# Patient Record
Sex: Female | Born: 1935 | Race: Black or African American | Hispanic: No | Marital: Single | Smoking: Former smoker
Health system: Southern US, Community
[De-identification: ages and names within clinical notes are randomized; demographics above are authoritative.]

## PROBLEM LIST (undated history)

## (undated) DIAGNOSIS — G459 Transient cerebral ischemic attack, unspecified: Secondary | ICD-10-CM

## (undated) DIAGNOSIS — F32A Depression, unspecified: Secondary | ICD-10-CM

## (undated) DIAGNOSIS — T191XXA Foreign body in bladder, initial encounter: Secondary | ICD-10-CM

## (undated) DIAGNOSIS — I509 Heart failure, unspecified: Secondary | ICD-10-CM

## (undated) DIAGNOSIS — A4902 Methicillin resistant Staphylococcus aureus infection, unspecified site: Secondary | ICD-10-CM

## (undated) DIAGNOSIS — I672 Cerebral atherosclerosis: Secondary | ICD-10-CM

## (undated) DIAGNOSIS — E785 Hyperlipidemia, unspecified: Secondary | ICD-10-CM

## (undated) DIAGNOSIS — K219 Gastro-esophageal reflux disease without esophagitis: Secondary | ICD-10-CM

## (undated) DIAGNOSIS — R609 Edema, unspecified: Secondary | ICD-10-CM

## (undated) DIAGNOSIS — D649 Anemia, unspecified: Secondary | ICD-10-CM

## (undated) DIAGNOSIS — T190XXA Foreign body in urethra, initial encounter: Secondary | ICD-10-CM

## (undated) DIAGNOSIS — G8190 Hemiplegia, unspecified affecting unspecified side: Secondary | ICD-10-CM

## (undated) DIAGNOSIS — G4700 Insomnia, unspecified: Secondary | ICD-10-CM

## (undated) DIAGNOSIS — I739 Peripheral vascular disease, unspecified: Secondary | ICD-10-CM

## (undated) DIAGNOSIS — I6522 Occlusion and stenosis of left carotid artery: Secondary | ICD-10-CM

## (undated) DIAGNOSIS — I639 Cerebral infarction, unspecified: Secondary | ICD-10-CM

## (undated) DIAGNOSIS — Z431 Encounter for attention to gastrostomy: Secondary | ICD-10-CM

## (undated) DIAGNOSIS — F329 Major depressive disorder, single episode, unspecified: Secondary | ICD-10-CM

## (undated) DIAGNOSIS — G2581 Restless legs syndrome: Secondary | ICD-10-CM

## (undated) HISTORY — DX: Peripheral vascular disease, unspecified: I73.9

## (undated) HISTORY — DX: Encounter for attention to gastrostomy: Z43.1

## (undated) HISTORY — PX: PEG PLACEMENT: SHX5437

## (undated) HISTORY — DX: Occlusion and stenosis of left carotid artery: I65.22

## (undated) HISTORY — DX: Morbid (severe) obesity due to excess calories: E66.01

## (undated) HISTORY — PX: UPPER GASTROINTESTINAL ENDOSCOPY: SHX188

## (undated) HISTORY — DX: Restless legs syndrome: G25.81

## (undated) HISTORY — DX: Cerebral infarction, unspecified: I63.9

## (undated) HISTORY — DX: Gastro-esophageal reflux disease without esophagitis: K21.9

## (undated) HISTORY — DX: Hyperlipidemia, unspecified: E78.5

---

## 2001-11-29 ENCOUNTER — Encounter: Payer: Self-pay | Admitting: Family Medicine

## 2001-11-29 ENCOUNTER — Ambulatory Visit (HOSPITAL_COMMUNITY): Admission: RE | Admit: 2001-11-29 | Discharge: 2001-11-29 | Payer: Self-pay | Admitting: Family Medicine

## 2001-12-25 ENCOUNTER — Ambulatory Visit (HOSPITAL_COMMUNITY): Admission: RE | Admit: 2001-12-25 | Discharge: 2001-12-25 | Payer: Self-pay | Admitting: Family Medicine

## 2002-03-15 ENCOUNTER — Encounter (HOSPITAL_BASED_OUTPATIENT_CLINIC_OR_DEPARTMENT_OTHER): Admission: RE | Admit: 2002-03-15 | Discharge: 2002-04-20 | Payer: Self-pay | Admitting: Internal Medicine

## 2002-10-01 ENCOUNTER — Emergency Department (HOSPITAL_COMMUNITY): Admission: EM | Admit: 2002-10-01 | Discharge: 2002-10-01 | Payer: Self-pay | Admitting: Emergency Medicine

## 2002-10-01 ENCOUNTER — Encounter: Payer: Self-pay | Admitting: Emergency Medicine

## 2002-11-05 ENCOUNTER — Encounter: Admission: RE | Admit: 2002-11-05 | Discharge: 2003-02-03 | Payer: Self-pay | Admitting: Internal Medicine

## 2003-02-22 ENCOUNTER — Encounter (HOSPITAL_BASED_OUTPATIENT_CLINIC_OR_DEPARTMENT_OTHER): Admission: RE | Admit: 2003-02-22 | Discharge: 2003-05-23 | Payer: Self-pay | Admitting: Internal Medicine

## 2003-05-09 ENCOUNTER — Ambulatory Visit (HOSPITAL_COMMUNITY): Admission: RE | Admit: 2003-05-09 | Discharge: 2003-05-09 | Payer: Self-pay | Admitting: Family Medicine

## 2003-05-09 ENCOUNTER — Encounter: Payer: Self-pay | Admitting: Family Medicine

## 2003-05-12 ENCOUNTER — Inpatient Hospital Stay (HOSPITAL_COMMUNITY): Admission: EM | Admit: 2003-05-12 | Discharge: 2003-05-21 | Payer: Self-pay | Admitting: Emergency Medicine

## 2003-05-12 ENCOUNTER — Encounter: Payer: Self-pay | Admitting: General Surgery

## 2003-05-14 ENCOUNTER — Encounter: Payer: Self-pay | Admitting: Family Medicine

## 2003-05-15 ENCOUNTER — Ambulatory Visit (HOSPITAL_COMMUNITY): Admission: RE | Admit: 2003-05-15 | Discharge: 2003-05-15 | Payer: Self-pay | Admitting: Family Medicine

## 2003-05-15 ENCOUNTER — Encounter: Payer: Self-pay | Admitting: Family Medicine

## 2003-05-24 ENCOUNTER — Encounter (HOSPITAL_BASED_OUTPATIENT_CLINIC_OR_DEPARTMENT_OTHER): Admission: RE | Admit: 2003-05-24 | Discharge: 2003-08-07 | Payer: Self-pay | Admitting: Internal Medicine

## 2003-08-31 HISTORY — PX: VENTRAL HERNIA REPAIR: SHX424

## 2003-10-24 ENCOUNTER — Encounter (HOSPITAL_BASED_OUTPATIENT_CLINIC_OR_DEPARTMENT_OTHER): Admission: RE | Admit: 2003-10-24 | Discharge: 2003-11-04 | Payer: Self-pay | Admitting: Internal Medicine

## 2003-12-27 ENCOUNTER — Emergency Department (HOSPITAL_COMMUNITY): Admission: EM | Admit: 2003-12-27 | Discharge: 2003-12-27 | Payer: Self-pay | Admitting: Emergency Medicine

## 2004-01-21 ENCOUNTER — Ambulatory Visit (HOSPITAL_COMMUNITY): Admission: RE | Admit: 2004-01-21 | Discharge: 2004-01-21 | Payer: Self-pay | Admitting: General Surgery

## 2004-01-28 ENCOUNTER — Encounter (HOSPITAL_COMMUNITY): Admission: RE | Admit: 2004-01-28 | Discharge: 2004-01-29 | Payer: Self-pay | Admitting: Internal Medicine

## 2004-02-07 ENCOUNTER — Inpatient Hospital Stay (HOSPITAL_COMMUNITY): Admission: RE | Admit: 2004-02-07 | Discharge: 2004-02-11 | Payer: Self-pay | Admitting: General Surgery

## 2004-03-12 ENCOUNTER — Encounter (HOSPITAL_BASED_OUTPATIENT_CLINIC_OR_DEPARTMENT_OTHER): Admission: RE | Admit: 2004-03-12 | Discharge: 2004-03-20 | Payer: Self-pay | Admitting: Internal Medicine

## 2004-09-30 ENCOUNTER — Ambulatory Visit (HOSPITAL_COMMUNITY): Admission: RE | Admit: 2004-09-30 | Discharge: 2004-09-30 | Payer: Self-pay | Admitting: Family Medicine

## 2004-09-30 ENCOUNTER — Ambulatory Visit: Payer: Self-pay | Admitting: Family Medicine

## 2004-10-21 ENCOUNTER — Ambulatory Visit: Payer: Self-pay | Admitting: Family Medicine

## 2004-12-02 ENCOUNTER — Ambulatory Visit: Payer: Self-pay | Admitting: Family Medicine

## 2005-03-03 ENCOUNTER — Ambulatory Visit: Payer: Self-pay | Admitting: Family Medicine

## 2005-04-21 ENCOUNTER — Ambulatory Visit: Payer: Self-pay | Admitting: Family Medicine

## 2005-07-21 ENCOUNTER — Ambulatory Visit: Payer: Self-pay | Admitting: Family Medicine

## 2005-09-01 ENCOUNTER — Ambulatory Visit: Payer: Self-pay | Admitting: Family Medicine

## 2005-11-30 ENCOUNTER — Ambulatory Visit: Payer: Self-pay | Admitting: Family Medicine

## 2006-03-21 ENCOUNTER — Ambulatory Visit: Payer: Self-pay | Admitting: Family Medicine

## 2006-05-23 ENCOUNTER — Emergency Department (HOSPITAL_COMMUNITY): Admission: EM | Admit: 2006-05-23 | Discharge: 2006-05-23 | Payer: Self-pay | Admitting: Emergency Medicine

## 2006-05-26 ENCOUNTER — Ambulatory Visit: Payer: Self-pay | Admitting: Family Medicine

## 2006-06-29 ENCOUNTER — Ambulatory Visit: Payer: Self-pay | Admitting: Family Medicine

## 2006-07-14 ENCOUNTER — Emergency Department (HOSPITAL_COMMUNITY): Admission: EM | Admit: 2006-07-14 | Discharge: 2006-07-15 | Payer: Self-pay | Admitting: Emergency Medicine

## 2006-07-19 ENCOUNTER — Ambulatory Visit: Payer: Self-pay | Admitting: Family Medicine

## 2006-07-22 ENCOUNTER — Inpatient Hospital Stay (HOSPITAL_COMMUNITY): Admission: EM | Admit: 2006-07-22 | Discharge: 2006-07-30 | Payer: Self-pay | Admitting: Emergency Medicine

## 2006-07-23 ENCOUNTER — Ambulatory Visit: Payer: Self-pay | Admitting: Cardiology

## 2006-07-23 ENCOUNTER — Ambulatory Visit: Payer: Self-pay | Admitting: Critical Care Medicine

## 2006-07-24 ENCOUNTER — Encounter: Payer: Self-pay | Admitting: Critical Care Medicine

## 2006-07-25 ENCOUNTER — Encounter: Payer: Self-pay | Admitting: Cardiology

## 2006-08-05 ENCOUNTER — Ambulatory Visit: Payer: Self-pay | Admitting: Family Medicine

## 2006-08-05 ENCOUNTER — Inpatient Hospital Stay (HOSPITAL_COMMUNITY): Admission: EM | Admit: 2006-08-05 | Discharge: 2006-08-22 | Payer: Self-pay | Admitting: Emergency Medicine

## 2006-08-08 ENCOUNTER — Encounter (INDEPENDENT_AMBULATORY_CARE_PROVIDER_SITE_OTHER): Payer: Self-pay | Admitting: Interventional Cardiology

## 2006-08-08 ENCOUNTER — Ambulatory Visit: Payer: Self-pay | Admitting: Pulmonary Disease

## 2006-08-09 ENCOUNTER — Encounter: Payer: Self-pay | Admitting: Pulmonary Disease

## 2006-08-13 ENCOUNTER — Ambulatory Visit: Payer: Self-pay | Admitting: Infectious Diseases

## 2006-08-16 ENCOUNTER — Encounter: Payer: Self-pay | Admitting: Vascular Surgery

## 2006-08-29 ENCOUNTER — Inpatient Hospital Stay (HOSPITAL_COMMUNITY): Admission: EM | Admit: 2006-08-29 | Discharge: 2006-09-07 | Payer: Self-pay | Admitting: Emergency Medicine

## 2006-08-30 HISTORY — PX: COLONOSCOPY: SHX174

## 2006-09-02 ENCOUNTER — Encounter: Payer: Self-pay | Admitting: *Deleted

## 2006-09-07 ENCOUNTER — Ambulatory Visit: Payer: Self-pay | Admitting: Gastroenterology

## 2006-09-13 ENCOUNTER — Ambulatory Visit: Payer: Self-pay | Admitting: Family Medicine

## 2006-09-16 ENCOUNTER — Encounter: Payer: Self-pay | Admitting: Family Medicine

## 2006-09-16 LAB — CONVERTED CEMR LAB: Retic Count, Absolute: 61.6 (ref 19.0–186.0)

## 2006-09-20 ENCOUNTER — Encounter (INDEPENDENT_AMBULATORY_CARE_PROVIDER_SITE_OTHER): Payer: Self-pay | Admitting: *Deleted

## 2006-09-20 ENCOUNTER — Ambulatory Visit: Payer: Self-pay | Admitting: Gastroenterology

## 2006-09-29 ENCOUNTER — Ambulatory Visit (HOSPITAL_COMMUNITY): Admission: RE | Admit: 2006-09-29 | Discharge: 2006-09-29 | Payer: Self-pay | Admitting: Family Medicine

## 2006-10-12 ENCOUNTER — Ambulatory Visit: Payer: Self-pay | Admitting: Family Medicine

## 2006-10-15 ENCOUNTER — Observation Stay (HOSPITAL_COMMUNITY): Admission: AD | Admit: 2006-10-15 | Discharge: 2006-10-15 | Payer: Self-pay | Admitting: Obstetrics and Gynecology

## 2006-10-15 ENCOUNTER — Encounter (INDEPENDENT_AMBULATORY_CARE_PROVIDER_SITE_OTHER): Payer: Self-pay | Admitting: *Deleted

## 2006-10-17 ENCOUNTER — Ambulatory Visit (HOSPITAL_COMMUNITY): Admission: RE | Admit: 2006-10-17 | Discharge: 2006-10-17 | Payer: Self-pay | Admitting: Family Medicine

## 2006-10-25 ENCOUNTER — Ambulatory Visit: Payer: Self-pay | Admitting: Gastroenterology

## 2006-11-11 ENCOUNTER — Inpatient Hospital Stay (HOSPITAL_COMMUNITY): Admission: EM | Admit: 2006-11-11 | Discharge: 2006-11-18 | Payer: Self-pay | Admitting: Emergency Medicine

## 2006-11-11 ENCOUNTER — Ambulatory Visit: Payer: Self-pay | Admitting: Gastroenterology

## 2006-11-11 ENCOUNTER — Ambulatory Visit: Payer: Self-pay | Admitting: Family Medicine

## 2006-11-14 ENCOUNTER — Ambulatory Visit: Payer: Self-pay | Admitting: Family Medicine

## 2006-11-16 ENCOUNTER — Encounter (INDEPENDENT_AMBULATORY_CARE_PROVIDER_SITE_OTHER): Payer: Self-pay | Admitting: Specialist

## 2006-11-16 ENCOUNTER — Ambulatory Visit: Payer: Self-pay | Admitting: Internal Medicine

## 2006-11-25 ENCOUNTER — Ambulatory Visit: Payer: Self-pay | Admitting: Gastroenterology

## 2006-12-15 ENCOUNTER — Ambulatory Visit (HOSPITAL_COMMUNITY): Admission: RE | Admit: 2006-12-15 | Discharge: 2006-12-15 | Payer: Self-pay | Admitting: Neurology

## 2007-01-17 ENCOUNTER — Ambulatory Visit: Payer: Self-pay | Admitting: Family Medicine

## 2007-01-20 ENCOUNTER — Encounter: Payer: Self-pay | Admitting: Family Medicine

## 2007-01-20 LAB — CONVERTED CEMR LAB
Nitrite: POSITIVE — AB
Protein, ur: NEGATIVE mg/dL
pH: 6 (ref 5.0–8.0)

## 2007-02-06 ENCOUNTER — Ambulatory Visit: Payer: Self-pay | Admitting: Family Medicine

## 2007-02-06 LAB — CONVERTED CEMR LAB
Basophils Absolute: 0.1 10*3/uL (ref 0.0–0.1)
Basophils Relative: 1 % (ref 0–1)
Bilirubin, Direct: 0.1 mg/dL (ref 0.0–0.3)
Calcium: 8.4 mg/dL (ref 8.4–10.5)
Creatinine, Ser: 1.01 mg/dL (ref 0.40–1.20)
Eosinophils Absolute: 0.5 10*3/uL (ref 0.0–0.7)
Eosinophils Relative: 6 % — ABNORMAL HIGH (ref 0–5)
HCT: 29.4 % — ABNORMAL LOW (ref 36.0–46.0)
Hemoglobin: 9.9 g/dL — ABNORMAL LOW (ref 12.0–15.0)
Indirect Bilirubin: 0.2 mg/dL (ref 0.0–0.9)
Iron: 40 ug/dL — ABNORMAL LOW (ref 42–145)
MCHC: 33.9 g/dL (ref 30.0–36.0)
Monocytes Absolute: 0.5 10*3/uL (ref 0.2–0.7)
RDW: 15.2 % — ABNORMAL HIGH (ref 11.5–14.0)
Retic Count, Absolute: 77.1 (ref 19.0–186.0)
Saturation Ratios: 24 % (ref 20–55)
TIBC: 166 ug/dL — ABNORMAL LOW (ref 250–470)
Total Bilirubin: 0.3 mg/dL (ref 0.3–1.2)
Total Protein: 6.7 g/dL (ref 6.0–8.3)

## 2007-02-07 ENCOUNTER — Encounter: Payer: Self-pay | Admitting: Family Medicine

## 2007-02-07 LAB — CONVERTED CEMR LAB

## 2007-02-08 ENCOUNTER — Emergency Department (HOSPITAL_COMMUNITY): Admission: EM | Admit: 2007-02-08 | Discharge: 2007-02-08 | Payer: Self-pay | Admitting: Emergency Medicine

## 2007-02-08 ENCOUNTER — Encounter (HOSPITAL_COMMUNITY): Admission: RE | Admit: 2007-02-08 | Discharge: 2007-03-10 | Payer: Self-pay | Admitting: Family Medicine

## 2007-02-09 ENCOUNTER — Encounter (HOSPITAL_BASED_OUTPATIENT_CLINIC_OR_DEPARTMENT_OTHER): Admission: RE | Admit: 2007-02-09 | Discharge: 2007-05-10 | Payer: Self-pay | Admitting: Surgery

## 2007-02-20 ENCOUNTER — Emergency Department (HOSPITAL_COMMUNITY): Admission: EM | Admit: 2007-02-20 | Discharge: 2007-02-20 | Payer: Self-pay | Admitting: Emergency Medicine

## 2007-02-24 ENCOUNTER — Encounter: Payer: Self-pay | Admitting: Family Medicine

## 2007-03-06 ENCOUNTER — Ambulatory Visit: Payer: Self-pay | Admitting: Family Medicine

## 2007-03-24 ENCOUNTER — Ambulatory Visit: Payer: Self-pay | Admitting: Family Medicine

## 2007-05-12 ENCOUNTER — Ambulatory Visit: Payer: Self-pay | Admitting: Family Medicine

## 2007-07-13 ENCOUNTER — Ambulatory Visit: Payer: Self-pay | Admitting: Family Medicine

## 2007-08-09 ENCOUNTER — Ambulatory Visit: Payer: Self-pay | Admitting: Family Medicine

## 2007-08-21 ENCOUNTER — Encounter: Payer: Self-pay | Admitting: Family Medicine

## 2007-08-22 ENCOUNTER — Encounter: Payer: Self-pay | Admitting: Family Medicine

## 2007-08-22 LAB — CONVERTED CEMR LAB: Microalb, Ur: 3.9 mg/dL — ABNORMAL HIGH (ref 0.00–1.89)

## 2007-08-31 ENCOUNTER — Encounter: Payer: Self-pay | Admitting: Family Medicine

## 2007-09-12 ENCOUNTER — Ambulatory Visit: Payer: Self-pay | Admitting: Family Medicine

## 2007-09-13 ENCOUNTER — Ambulatory Visit: Payer: Self-pay | Admitting: Family Medicine

## 2007-09-13 LAB — CONVERTED CEMR LAB
Eosinophils Relative: 5 % (ref 0–5)
FSH: 13.8 milliintl units/mL
HCT: 38.1 % (ref 36.0–46.0)
LH: 13.4 milliintl units/mL
Lymphocytes Relative: 44 % (ref 12–46)
Lymphs Abs: 3.1 10*3/uL (ref 0.7–4.0)
Platelets: 338 10*3/uL (ref 150–400)
TSH: 1.718 microintl units/mL (ref 0.350–5.50)
WBC: 7.1 10*3/uL (ref 4.0–10.5)

## 2007-11-14 ENCOUNTER — Ambulatory Visit: Payer: Self-pay | Admitting: Family Medicine

## 2007-11-24 DIAGNOSIS — L02419 Cutaneous abscess of limb, unspecified: Secondary | ICD-10-CM

## 2007-11-24 DIAGNOSIS — G4700 Insomnia, unspecified: Secondary | ICD-10-CM

## 2007-11-24 DIAGNOSIS — L03119 Cellulitis of unspecified part of limb: Secondary | ICD-10-CM | POA: Insufficient documentation

## 2007-11-24 DIAGNOSIS — G2581 Restless legs syndrome: Secondary | ICD-10-CM

## 2007-11-24 DIAGNOSIS — M1710 Unilateral primary osteoarthritis, unspecified knee: Secondary | ICD-10-CM

## 2007-11-24 DIAGNOSIS — K219 Gastro-esophageal reflux disease without esophagitis: Secondary | ICD-10-CM

## 2007-11-24 DIAGNOSIS — E119 Type 2 diabetes mellitus without complications: Secondary | ICD-10-CM

## 2007-11-24 DIAGNOSIS — E785 Hyperlipidemia, unspecified: Secondary | ICD-10-CM

## 2007-11-24 DIAGNOSIS — I739 Peripheral vascular disease, unspecified: Secondary | ICD-10-CM | POA: Insufficient documentation

## 2007-11-29 ENCOUNTER — Ambulatory Visit: Payer: Self-pay | Admitting: Family Medicine

## 2007-12-08 ENCOUNTER — Encounter (HOSPITAL_COMMUNITY): Admission: RE | Admit: 2007-12-08 | Discharge: 2008-01-07 | Payer: Self-pay | Admitting: Family Medicine

## 2008-01-03 ENCOUNTER — Ambulatory Visit: Payer: Self-pay | Admitting: Family Medicine

## 2008-02-05 ENCOUNTER — Ambulatory Visit: Payer: Self-pay | Admitting: Family Medicine

## 2008-03-06 ENCOUNTER — Ambulatory Visit: Payer: Self-pay | Admitting: Family Medicine

## 2008-03-17 ENCOUNTER — Inpatient Hospital Stay (HOSPITAL_COMMUNITY): Admission: EM | Admit: 2008-03-17 | Discharge: 2008-03-22 | Payer: Self-pay | Admitting: Emergency Medicine

## 2008-03-28 ENCOUNTER — Telehealth: Payer: Self-pay | Admitting: Family Medicine

## 2008-03-29 ENCOUNTER — Encounter: Payer: Self-pay | Admitting: Family Medicine

## 2008-03-29 ENCOUNTER — Ambulatory Visit: Payer: Self-pay | Admitting: Family Medicine

## 2008-03-29 ENCOUNTER — Telehealth: Payer: Self-pay | Admitting: Family Medicine

## 2008-04-03 ENCOUNTER — Ambulatory Visit: Payer: Self-pay | Admitting: Family Medicine

## 2008-05-03 ENCOUNTER — Telehealth: Payer: Self-pay | Admitting: Family Medicine

## 2008-05-10 ENCOUNTER — Encounter: Payer: Self-pay | Admitting: Family Medicine

## 2008-05-14 ENCOUNTER — Ambulatory Visit: Payer: Self-pay | Admitting: Family Medicine

## 2008-05-14 DIAGNOSIS — R5381 Other malaise: Secondary | ICD-10-CM

## 2008-05-14 DIAGNOSIS — R5383 Other fatigue: Secondary | ICD-10-CM

## 2008-05-14 LAB — CONVERTED CEMR LAB
Basophils Absolute: 0.1 10*3/uL (ref 0.0–0.1)
Blood Glucose, Fasting: 110 mg/dL
CO2: 23 meq/L (ref 19–32)
Chloride: 105 meq/L (ref 96–112)
HDL: 50 mg/dL (ref >39)
Hemoglobin: 12.6 g/dL (ref 12.0–15.0)
Hgb A1c MFr Bld: 6.4 %
LDL Cholesterol: 55 mg/dL (ref 0–99)
Lymphocytes Relative: 40 % (ref 12–46)
Neutro Abs: 3.3 10*3/uL (ref 1.7–7.7)
Neutrophils Relative %: 44 % (ref 43–77)
Platelets: 331 10*3/uL (ref 150–400)
Potassium: 4.1 meq/L (ref 3.5–5.3)
RDW: 15.8 % — ABNORMAL HIGH (ref 11.5–15.5)
Sodium: 141 meq/L (ref 135–145)
Total CHOL/HDL Ratio: 2.4

## 2008-05-17 DIAGNOSIS — B370 Candidal stomatitis: Secondary | ICD-10-CM

## 2008-07-17 ENCOUNTER — Encounter: Payer: Self-pay | Admitting: Family Medicine

## 2008-07-22 ENCOUNTER — Ambulatory Visit: Payer: Self-pay | Admitting: Family Medicine

## 2008-08-02 ENCOUNTER — Telehealth: Payer: Self-pay | Admitting: Family Medicine

## 2008-08-13 ENCOUNTER — Ambulatory Visit: Payer: Self-pay | Admitting: Family Medicine

## 2008-08-13 DIAGNOSIS — E1169 Type 2 diabetes mellitus with other specified complication: Secondary | ICD-10-CM | POA: Insufficient documentation

## 2008-08-13 LAB — CONVERTED CEMR LAB: Glucose, Bld: 97 mg/dL

## 2008-08-14 ENCOUNTER — Telehealth: Payer: Self-pay | Admitting: Family Medicine

## 2008-08-30 DIAGNOSIS — I639 Cerebral infarction, unspecified: Secondary | ICD-10-CM

## 2008-08-30 HISTORY — DX: Cerebral infarction, unspecified: I63.9

## 2008-09-03 ENCOUNTER — Ambulatory Visit: Payer: Self-pay | Admitting: Family Medicine

## 2008-09-04 ENCOUNTER — Telehealth: Payer: Self-pay | Admitting: Family Medicine

## 2008-09-04 ENCOUNTER — Encounter: Payer: Self-pay | Admitting: Family Medicine

## 2008-09-23 ENCOUNTER — Encounter: Payer: Self-pay | Admitting: Family Medicine

## 2008-09-24 ENCOUNTER — Ambulatory Visit: Payer: Self-pay | Admitting: Family Medicine

## 2008-10-11 IMAGING — CR DG CHEST 1V PORT
1 series · 1 of 1 positions shown · non-contrast
Comparison: 07/14/2006.

CLINICAL DATA: Shortness of breath.

PORTABLE CHEST - 1 VIEW

[view not recorded]
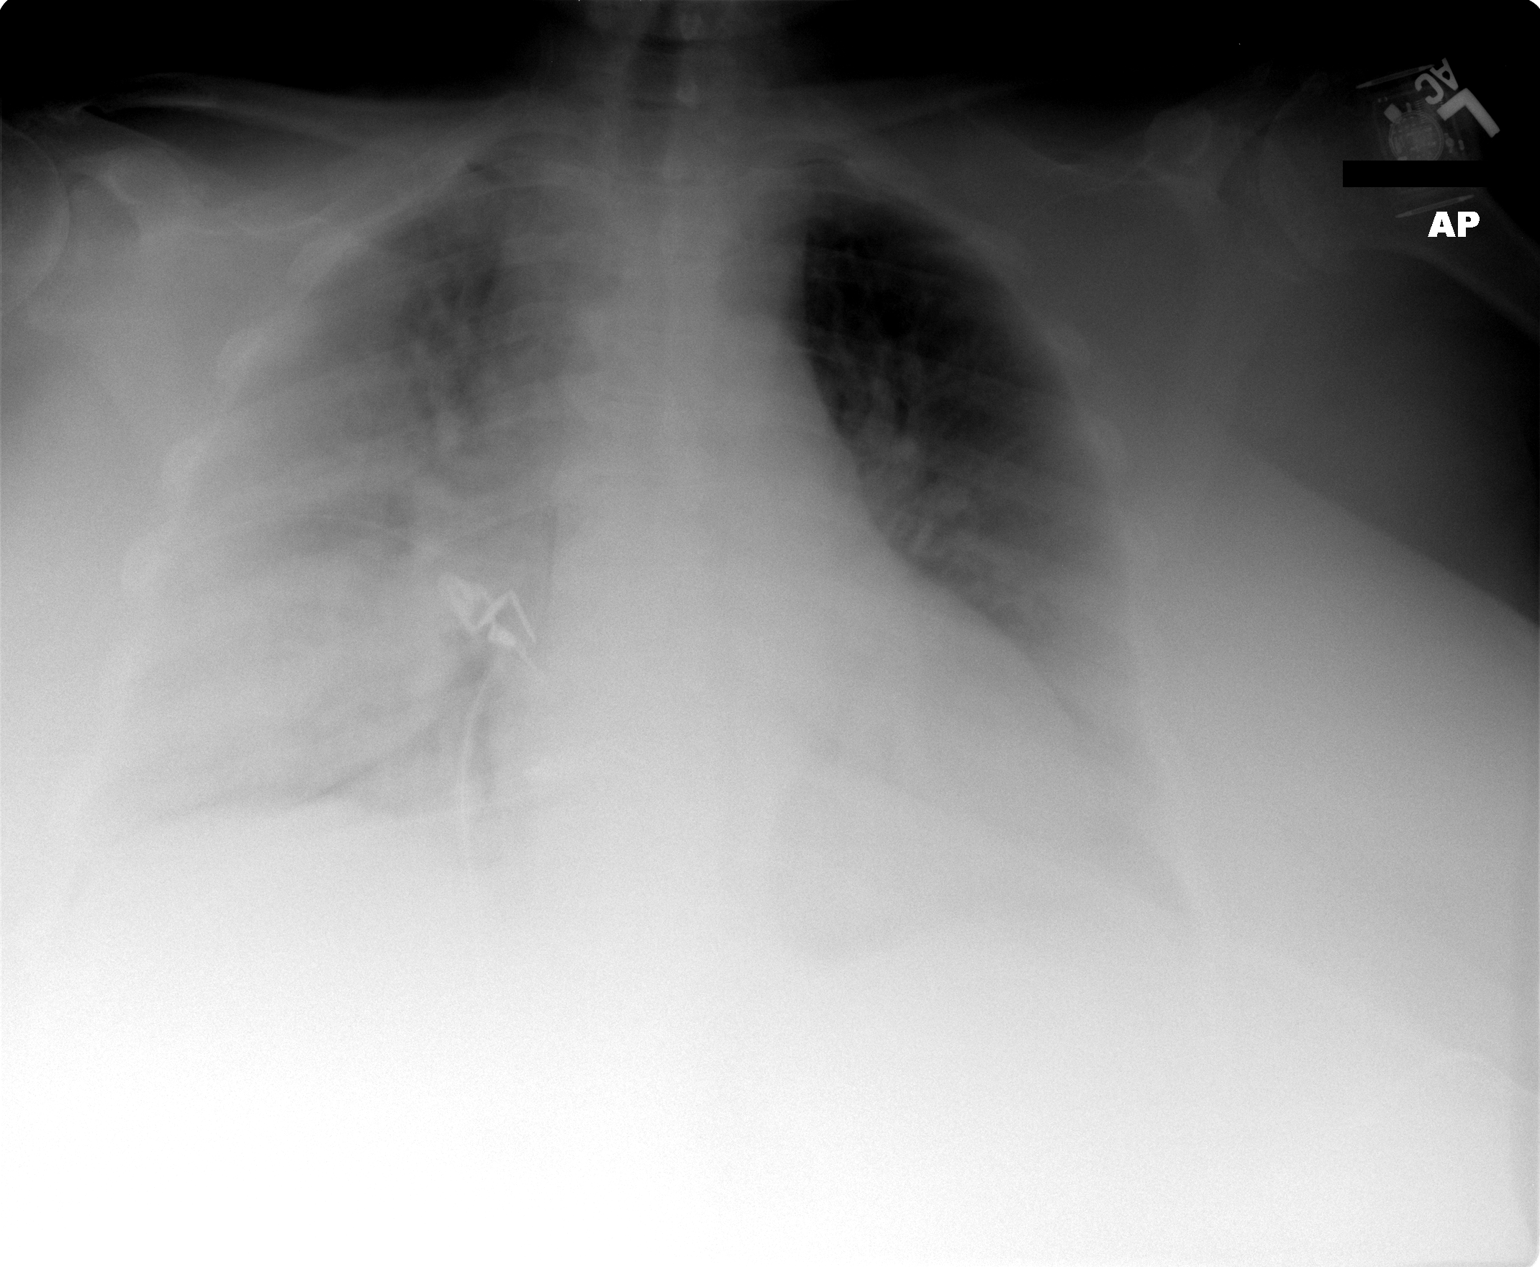

[1 of 1 positions shown; findings below may reference images not displayed]

FINDINGS: Limited examination due to significant breathing motion artifact.
Borderline enlarged cardiac silhouette. Possible airspace opacity in the right
lung. Grossly clear left lung. Thoracic spine degenerative changes. Left
shoulder degenerative changes. Obesity. 

IMPRESSION

1. Limited examination due to significant breathing motion artifact. A repeat
examination is recommended when the patient is able to cooperate.

2. Possible right lung pneumonia.

3. Borderline cardiomegaly.

## 2008-10-17 ENCOUNTER — Telehealth: Payer: Self-pay | Admitting: Family Medicine

## 2008-10-21 ENCOUNTER — Telehealth: Payer: Self-pay | Admitting: Family Medicine

## 2008-10-24 ENCOUNTER — Ambulatory Visit: Payer: Self-pay | Admitting: Family Medicine

## 2008-10-28 ENCOUNTER — Encounter: Payer: Self-pay | Admitting: Family Medicine

## 2008-11-03 IMAGING — CR DG CHEST 1V PORT
1 series · 1 of 1 positions shown · non-contrast
Comparison: Earlier film from today.

CLINICAL DATA: 74-year-old with respiratory distress, pneumonia, PICC line placement. 
 PORTABLE CHEST - 1 VIEW:

[view not recorded]
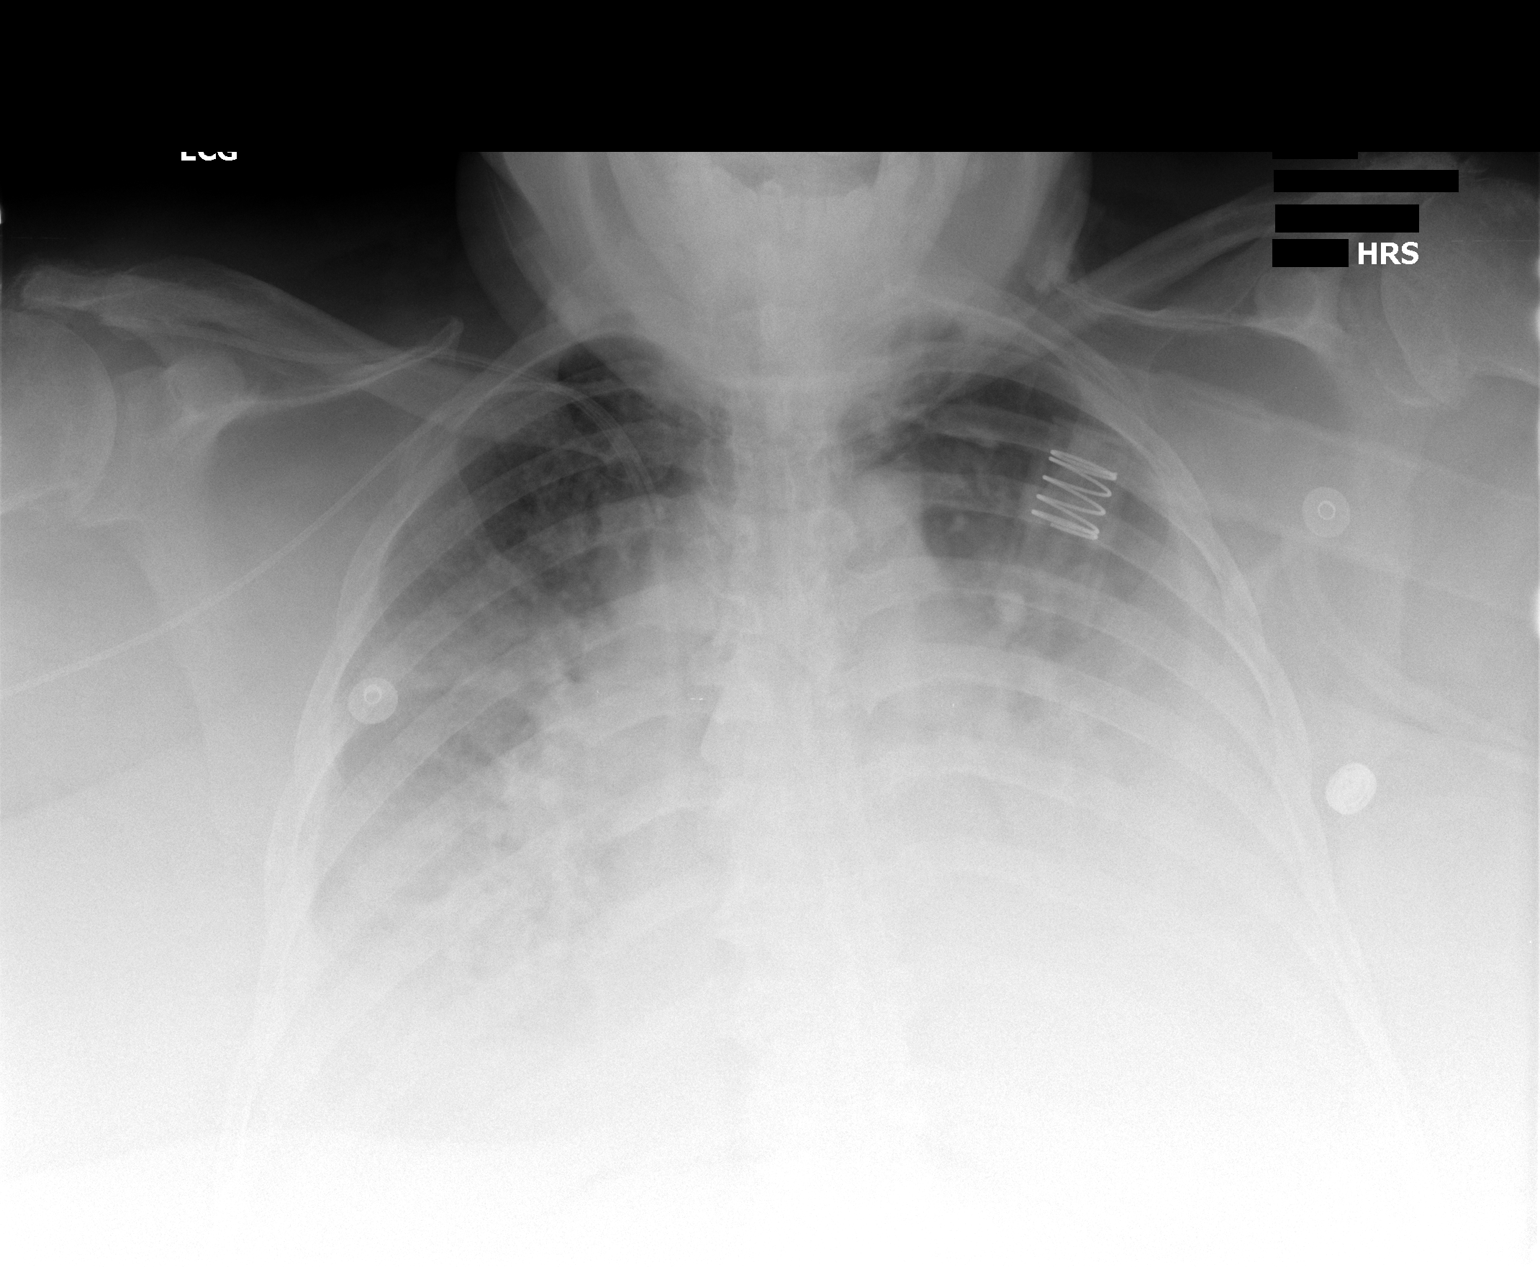

[1 of 1 positions shown; findings below may reference images not displayed]

FINDINGS: There is a right subclavian central venous catheter with its tip in the SVC distally.  The PICC line tip appears to be in the proximal SVC or near the subclavian-jugular junction.
IMPRESSION: PICC line tip in the proximal SVC.  The right subclavian catheter is in the distal SVC.

## 2008-11-04 IMAGING — CR DG CHEST 1V PORT
1 series · 1 of 1 positions shown · non-contrast
Comparison: 08/14/06.

CLINICAL DATA: Respiratory distress.  
 PORTABLE CHEST:

[view not recorded]
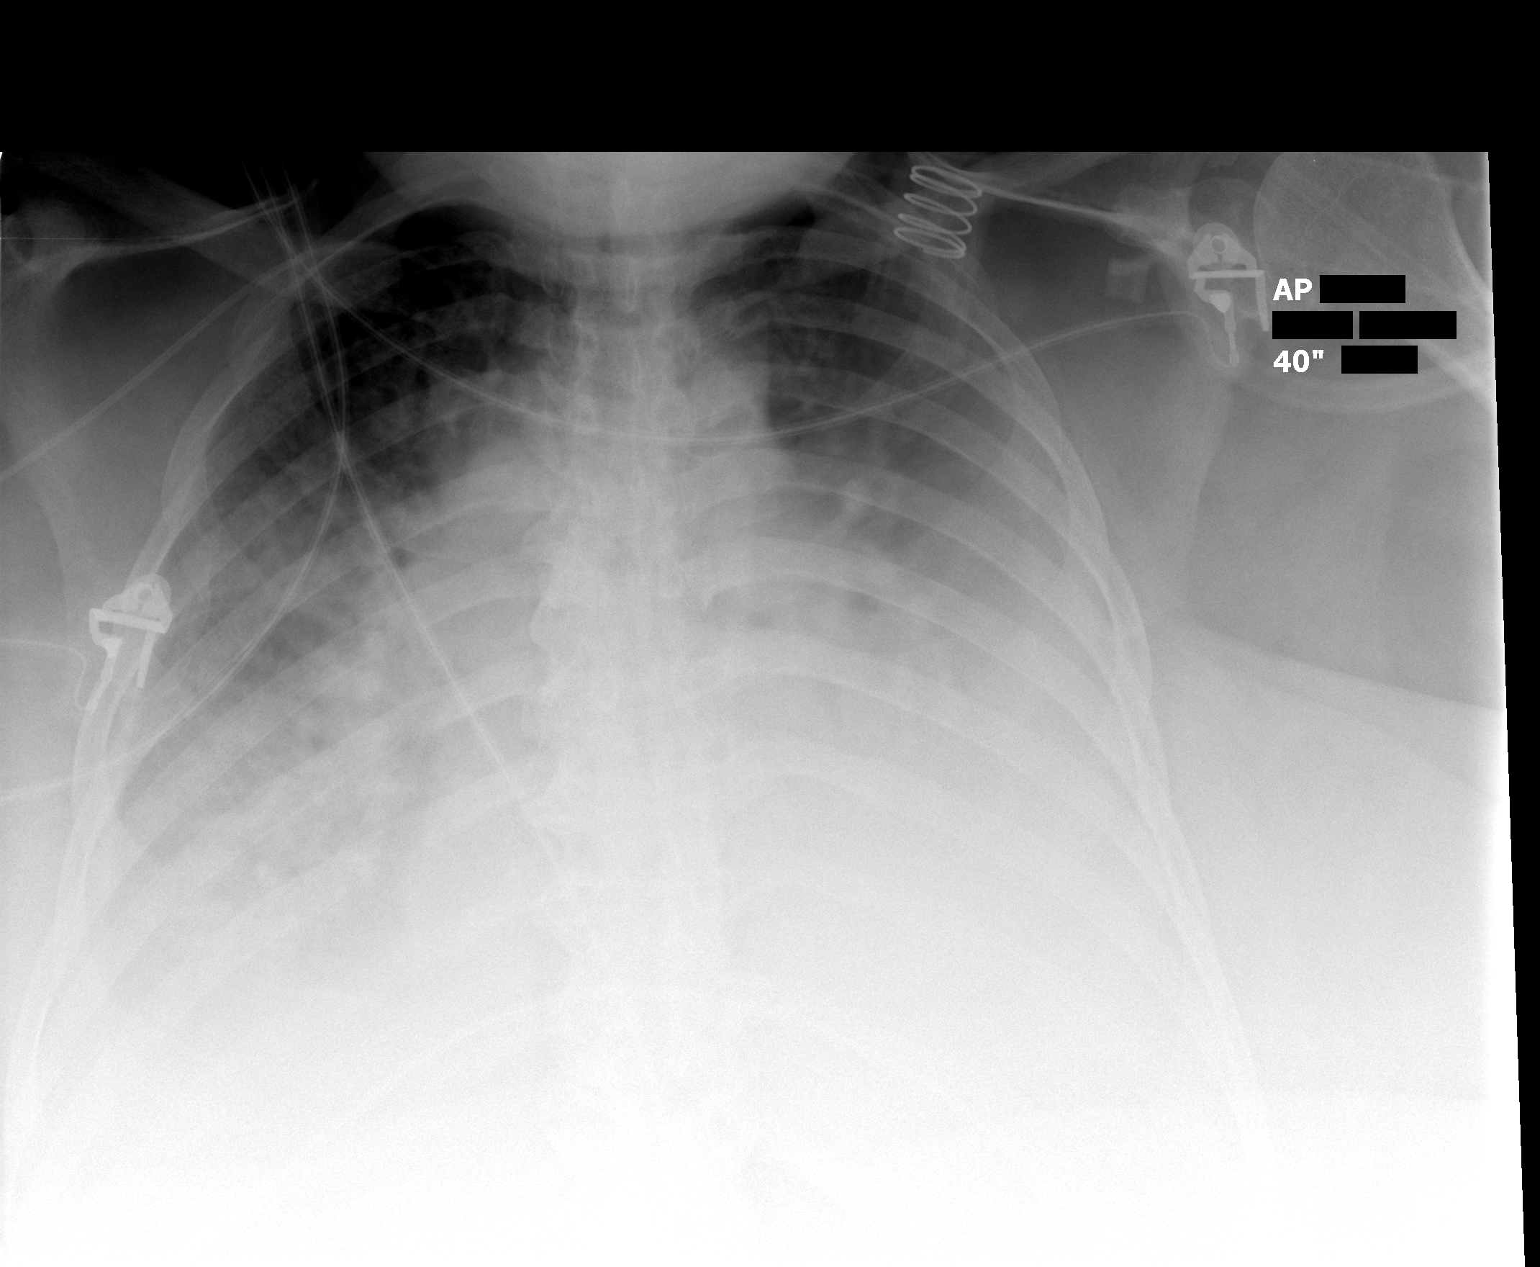

[1 of 1 positions shown; findings below may reference images not displayed]

FINDINGS: Right subclavian catheter has been removed with a right-sided PICC still in place with the tip just within the superior vena cava.  Bilateral pleural effusions and airspace disease persist without change.  Cardiac silhouette is largely obscured.
IMPRESSION: Right subclavian catheter removed.  No change in bilateral effusions and airspace disease.

## 2008-11-08 ENCOUNTER — Telehealth: Payer: Self-pay | Admitting: Family Medicine

## 2008-11-12 ENCOUNTER — Ambulatory Visit: Payer: Self-pay | Admitting: Family Medicine

## 2008-11-12 LAB — CONVERTED CEMR LAB
AST: 11 units/L (ref 0–37)
Bilirubin, Direct: 0.1 mg/dL (ref 0.0–0.3)
CO2: 22 meq/L (ref 19–32)
Calcium: 8.7 mg/dL (ref 8.4–10.5)
Chloride: 107 meq/L (ref 96–112)
Creatinine, Ser: 0.89 mg/dL (ref 0.40–1.20)
Glucose, Bld: 84 mg/dL (ref 70–99)
LDL Cholesterol: 57 mg/dL (ref 0–99)
Sodium: 141 meq/L (ref 135–145)
TSH: 1.276 microintl units/mL (ref 0.350–4.500)
Total Bilirubin: 0.3 mg/dL (ref 0.3–1.2)
Total CHOL/HDL Ratio: 2.6

## 2008-11-15 ENCOUNTER — Telehealth: Payer: Self-pay | Admitting: Family Medicine

## 2008-11-16 DIAGNOSIS — E1149 Type 2 diabetes mellitus with other diabetic neurological complication: Secondary | ICD-10-CM | POA: Insufficient documentation

## 2008-11-16 DIAGNOSIS — R32 Unspecified urinary incontinence: Secondary | ICD-10-CM

## 2008-11-26 ENCOUNTER — Telehealth: Payer: Self-pay | Admitting: Family Medicine

## 2008-11-27 ENCOUNTER — Encounter: Payer: Self-pay | Admitting: Family Medicine

## 2008-12-02 ENCOUNTER — Ambulatory Visit: Payer: Self-pay | Admitting: Family Medicine

## 2008-12-05 ENCOUNTER — Encounter: Payer: Self-pay | Admitting: Family Medicine

## 2008-12-09 ENCOUNTER — Telehealth: Payer: Self-pay | Admitting: Family Medicine

## 2008-12-24 ENCOUNTER — Encounter: Payer: Self-pay | Admitting: Family Medicine

## 2008-12-25 ENCOUNTER — Encounter: Payer: Self-pay | Admitting: Family Medicine

## 2008-12-30 ENCOUNTER — Inpatient Hospital Stay (HOSPITAL_COMMUNITY): Admission: EM | Admit: 2008-12-30 | Discharge: 2009-01-03 | Payer: Self-pay | Admitting: Emergency Medicine

## 2009-01-02 ENCOUNTER — Encounter: Payer: Self-pay | Admitting: Family Medicine

## 2009-01-08 ENCOUNTER — Encounter: Payer: Self-pay | Admitting: Family Medicine

## 2009-01-14 ENCOUNTER — Encounter: Payer: Self-pay | Admitting: Family Medicine

## 2009-01-17 ENCOUNTER — Encounter: Payer: Self-pay | Admitting: Family Medicine

## 2009-01-21 ENCOUNTER — Ambulatory Visit: Payer: Self-pay | Admitting: Family Medicine

## 2009-01-23 ENCOUNTER — Encounter: Payer: Self-pay | Admitting: Family Medicine

## 2009-01-31 ENCOUNTER — Telehealth: Payer: Self-pay | Admitting: Family Medicine

## 2009-02-19 ENCOUNTER — Encounter: Payer: Self-pay | Admitting: Family Medicine

## 2009-02-25 ENCOUNTER — Encounter: Payer: Self-pay | Admitting: Family Medicine

## 2009-02-27 ENCOUNTER — Encounter: Payer: Self-pay | Admitting: Family Medicine

## 2009-02-27 ENCOUNTER — Telehealth: Payer: Self-pay | Admitting: Family Medicine

## 2009-03-04 ENCOUNTER — Ambulatory Visit: Payer: Self-pay | Admitting: Family Medicine

## 2009-03-04 DIAGNOSIS — M25569 Pain in unspecified knee: Secondary | ICD-10-CM

## 2009-03-04 DIAGNOSIS — M79609 Pain in unspecified limb: Secondary | ICD-10-CM

## 2009-03-04 LAB — CONVERTED CEMR LAB
Blood Glucose, Fasting: 109 mg/dL
Hgb A1c MFr Bld: 6.2 %

## 2009-03-09 DIAGNOSIS — R609 Edema, unspecified: Secondary | ICD-10-CM

## 2009-03-12 ENCOUNTER — Encounter: Payer: Self-pay | Admitting: Family Medicine

## 2009-03-15 ENCOUNTER — Ambulatory Visit: Payer: Self-pay | Admitting: Cardiology

## 2009-03-15 ENCOUNTER — Inpatient Hospital Stay (HOSPITAL_COMMUNITY): Admission: EM | Admit: 2009-03-15 | Discharge: 2009-03-19 | Payer: Self-pay | Admitting: Emergency Medicine

## 2009-03-17 ENCOUNTER — Encounter (INDEPENDENT_AMBULATORY_CARE_PROVIDER_SITE_OTHER): Payer: Self-pay | Admitting: Internal Medicine

## 2009-03-21 ENCOUNTER — Telehealth: Payer: Self-pay | Admitting: Family Medicine

## 2009-03-21 ENCOUNTER — Ambulatory Visit: Payer: Self-pay | Admitting: Vascular Surgery

## 2009-03-21 ENCOUNTER — Encounter: Payer: Self-pay | Admitting: Family Medicine

## 2009-03-24 ENCOUNTER — Encounter: Payer: Self-pay | Admitting: Family Medicine

## 2009-03-25 ENCOUNTER — Ambulatory Visit (HOSPITAL_COMMUNITY): Admission: RE | Admit: 2009-03-25 | Discharge: 2009-03-25 | Payer: Self-pay | Admitting: Vascular Surgery

## 2009-03-26 ENCOUNTER — Ambulatory Visit: Payer: Self-pay | Admitting: Family Medicine

## 2009-03-26 DIAGNOSIS — G4730 Sleep apnea, unspecified: Secondary | ICD-10-CM | POA: Insufficient documentation

## 2009-03-26 LAB — CONVERTED CEMR LAB: Glucose, Bld: 119 mg/dL

## 2009-03-27 DIAGNOSIS — I6350 Cerebral infarction due to unspecified occlusion or stenosis of unspecified cerebral artery: Secondary | ICD-10-CM | POA: Insufficient documentation

## 2009-04-01 ENCOUNTER — Ambulatory Visit: Payer: Self-pay | Admitting: Family Medicine

## 2009-04-02 ENCOUNTER — Encounter: Payer: Self-pay | Admitting: Family Medicine

## 2009-04-07 ENCOUNTER — Encounter: Payer: Self-pay | Admitting: Family Medicine

## 2009-04-08 ENCOUNTER — Encounter: Payer: Self-pay | Admitting: Family Medicine

## 2009-04-11 ENCOUNTER — Encounter: Payer: Self-pay | Admitting: Family Medicine

## 2009-04-11 ENCOUNTER — Ambulatory Visit: Payer: Self-pay | Admitting: Vascular Surgery

## 2009-04-14 ENCOUNTER — Emergency Department (HOSPITAL_COMMUNITY): Admission: EM | Admit: 2009-04-14 | Discharge: 2009-04-14 | Payer: Self-pay | Admitting: Emergency Medicine

## 2009-04-17 ENCOUNTER — Encounter: Payer: Self-pay | Admitting: Vascular Surgery

## 2009-04-17 ENCOUNTER — Inpatient Hospital Stay (HOSPITAL_COMMUNITY): Admission: RE | Admit: 2009-04-17 | Discharge: 2009-04-19 | Payer: Self-pay | Admitting: Vascular Surgery

## 2009-04-18 ENCOUNTER — Ambulatory Visit: Payer: Self-pay | Admitting: Vascular Surgery

## 2009-04-18 ENCOUNTER — Encounter: Payer: Self-pay | Admitting: Vascular Surgery

## 2009-04-21 ENCOUNTER — Encounter: Payer: Self-pay | Admitting: Family Medicine

## 2009-04-22 ENCOUNTER — Telehealth: Payer: Self-pay | Admitting: Family Medicine

## 2009-04-23 ENCOUNTER — Telehealth: Payer: Self-pay | Admitting: Family Medicine

## 2009-04-24 ENCOUNTER — Encounter: Payer: Self-pay | Admitting: Family Medicine

## 2009-04-25 ENCOUNTER — Encounter: Payer: Self-pay | Admitting: Family Medicine

## 2009-04-29 ENCOUNTER — Encounter: Payer: Self-pay | Admitting: Family Medicine

## 2009-05-02 ENCOUNTER — Telehealth: Payer: Self-pay | Admitting: Family Medicine

## 2009-05-05 ENCOUNTER — Inpatient Hospital Stay (HOSPITAL_COMMUNITY): Admission: EM | Admit: 2009-05-05 | Discharge: 2009-05-13 | Payer: Self-pay | Admitting: Emergency Medicine

## 2009-05-05 ENCOUNTER — Encounter: Payer: Self-pay | Admitting: Emergency Medicine

## 2009-05-07 ENCOUNTER — Ambulatory Visit: Payer: Self-pay | Admitting: Physical Medicine & Rehabilitation

## 2009-05-12 ENCOUNTER — Encounter: Payer: Self-pay | Admitting: Family Medicine

## 2009-05-14 ENCOUNTER — Encounter: Payer: Self-pay | Admitting: Family Medicine

## 2009-06-12 ENCOUNTER — Ambulatory Visit (HOSPITAL_COMMUNITY): Admission: RE | Admit: 2009-06-12 | Discharge: 2009-06-12 | Payer: Self-pay | Admitting: Internal Medicine

## 2009-08-13 ENCOUNTER — Emergency Department (HOSPITAL_COMMUNITY): Admission: EM | Admit: 2009-08-13 | Discharge: 2009-08-13 | Payer: Self-pay | Admitting: Emergency Medicine

## 2009-08-27 ENCOUNTER — Ambulatory Visit (HOSPITAL_COMMUNITY): Admission: RE | Admit: 2009-08-27 | Discharge: 2009-08-27 | Payer: Self-pay | Admitting: Internal Medicine

## 2009-09-16 DIAGNOSIS — R1310 Dysphagia, unspecified: Secondary | ICD-10-CM | POA: Insufficient documentation

## 2009-09-16 DIAGNOSIS — R634 Abnormal weight loss: Secondary | ICD-10-CM | POA: Insufficient documentation

## 2009-09-16 DIAGNOSIS — R197 Diarrhea, unspecified: Secondary | ICD-10-CM

## 2009-09-16 DIAGNOSIS — I10 Essential (primary) hypertension: Secondary | ICD-10-CM | POA: Insufficient documentation

## 2009-09-16 DIAGNOSIS — I5033 Acute on chronic diastolic (congestive) heart failure: Secondary | ICD-10-CM | POA: Insufficient documentation

## 2009-09-17 ENCOUNTER — Ambulatory Visit: Payer: Self-pay | Admitting: Gastroenterology

## 2009-09-19 ENCOUNTER — Ambulatory Visit (HOSPITAL_COMMUNITY): Admission: RE | Admit: 2009-09-19 | Discharge: 2009-09-19 | Payer: Self-pay | Admitting: Gastroenterology

## 2009-09-19 ENCOUNTER — Encounter: Payer: Self-pay | Admitting: Gastroenterology

## 2009-09-30 ENCOUNTER — Encounter: Payer: Self-pay | Admitting: Gastroenterology

## 2009-09-30 ENCOUNTER — Emergency Department (HOSPITAL_COMMUNITY): Admission: EM | Admit: 2009-09-30 | Discharge: 2009-09-30 | Payer: Self-pay | Admitting: Emergency Medicine

## 2009-10-06 ENCOUNTER — Telehealth: Payer: Self-pay | Admitting: Family Medicine

## 2009-11-12 ENCOUNTER — Encounter: Payer: Self-pay | Admitting: Gastroenterology

## 2009-11-12 ENCOUNTER — Ambulatory Visit: Payer: Self-pay | Admitting: Gastroenterology

## 2009-11-12 DIAGNOSIS — D649 Anemia, unspecified: Secondary | ICD-10-CM

## 2009-11-14 ENCOUNTER — Encounter: Payer: Self-pay | Admitting: Gastroenterology

## 2009-11-18 ENCOUNTER — Encounter: Payer: Self-pay | Admitting: Gastroenterology

## 2009-11-20 ENCOUNTER — Inpatient Hospital Stay (HOSPITAL_COMMUNITY): Admission: EM | Admit: 2009-11-20 | Discharge: 2009-11-25 | Payer: Self-pay | Admitting: Emergency Medicine

## 2009-11-27 ENCOUNTER — Encounter: Payer: Self-pay | Admitting: Gastroenterology

## 2009-12-02 ENCOUNTER — Telehealth: Payer: Self-pay | Admitting: Gastroenterology

## 2009-12-26 ENCOUNTER — Ambulatory Visit (HOSPITAL_COMMUNITY): Admission: RE | Admit: 2009-12-26 | Discharge: 2009-12-26 | Payer: Self-pay | Admitting: Internal Medicine

## 2009-12-28 ENCOUNTER — Emergency Department (HOSPITAL_COMMUNITY): Admission: EM | Admit: 2009-12-28 | Discharge: 2009-12-29 | Payer: Self-pay | Admitting: Emergency Medicine

## 2010-01-02 ENCOUNTER — Ambulatory Visit (HOSPITAL_COMMUNITY): Admission: RE | Admit: 2010-01-02 | Discharge: 2010-01-02 | Payer: Self-pay | Admitting: Urology

## 2010-01-20 ENCOUNTER — Inpatient Hospital Stay (HOSPITAL_COMMUNITY): Admission: RE | Admit: 2010-01-20 | Discharge: 2010-01-23 | Payer: Self-pay | Admitting: Internal Medicine

## 2010-01-20 ENCOUNTER — Telehealth (INDEPENDENT_AMBULATORY_CARE_PROVIDER_SITE_OTHER): Payer: Self-pay

## 2010-01-22 ENCOUNTER — Encounter (INDEPENDENT_AMBULATORY_CARE_PROVIDER_SITE_OTHER): Payer: Self-pay | Admitting: Internal Medicine

## 2010-02-10 ENCOUNTER — Ambulatory Visit (HOSPITAL_COMMUNITY): Admission: RE | Admit: 2010-02-10 | Discharge: 2010-02-10 | Payer: Self-pay | Admitting: Internal Medicine

## 2010-03-11 ENCOUNTER — Ambulatory Visit: Payer: Self-pay | Admitting: Gastroenterology

## 2010-05-21 ENCOUNTER — Ambulatory Visit: Payer: Self-pay | Admitting: Gastroenterology

## 2010-05-25 ENCOUNTER — Ambulatory Visit (HOSPITAL_COMMUNITY): Admission: RE | Admit: 2010-05-25 | Discharge: 2010-05-25 | Payer: Self-pay | Admitting: Gastroenterology

## 2010-05-26 ENCOUNTER — Ambulatory Visit (HOSPITAL_COMMUNITY): Admission: RE | Admit: 2010-05-26 | Discharge: 2010-05-26 | Payer: Self-pay | Admitting: Gastroenterology

## 2010-05-28 ENCOUNTER — Telehealth (INDEPENDENT_AMBULATORY_CARE_PROVIDER_SITE_OTHER): Payer: Self-pay

## 2010-06-02 ENCOUNTER — Encounter: Payer: Self-pay | Admitting: Gastroenterology

## 2010-07-02 ENCOUNTER — Encounter (INDEPENDENT_AMBULATORY_CARE_PROVIDER_SITE_OTHER): Payer: Self-pay | Admitting: *Deleted

## 2010-08-11 ENCOUNTER — Ambulatory Visit: Payer: Self-pay | Admitting: Gastroenterology

## 2010-08-11 DIAGNOSIS — K5900 Constipation, unspecified: Secondary | ICD-10-CM | POA: Insufficient documentation

## 2010-08-12 ENCOUNTER — Encounter: Payer: Self-pay | Admitting: Gastroenterology

## 2010-09-20 ENCOUNTER — Encounter: Payer: Self-pay | Admitting: Family Medicine

## 2010-09-21 ENCOUNTER — Encounter: Payer: Self-pay | Admitting: Vascular Surgery

## 2010-09-29 NOTE — Letter (Signed)
Summary: history and pysical  history and pysical   Imported By: Curtis Sites 02/03/2010 11:56:31  _____________________________________________________________________  External Attachment:    Type:   Image     Comment:   External Document

## 2010-09-29 NOTE — Letter (Signed)
Summary: demographic  demographic   Imported By: Curtis Sites 02/03/2010 11:56:00  _____________________________________________________________________  External Attachment:    Type:   Image     Comment:   External Document

## 2010-09-29 NOTE — Progress Notes (Signed)
  Phone Note From Pharmacy   Caller: [facility] Summary of Call: nexium isnt covered by insurance omeprazole is on formulary Initial call taken by: Worthy Keeler LPN,  October 06, 2009 3:28 PM  Follow-up for Phone Call        ERx refill for Omeprazole was sent to pharmacy. Follow-up by: Esperanza Sheets PA,  October 06, 2009 4:33 PM    New/Updated Medications: OMEPRAZOLE 20 MG  CPDR (OMEPRAZOLE) Take 1 tablet by mouth once a day  stop nexium Prescriptions: OMEPRAZOLE 20 MG  CPDR (OMEPRAZOLE) Take 1 tablet by mouth once a day  stop nexium  #30 x 1   Entered by:   Worthy Keeler LPN   Authorized by:   Syliva Overman MD   Signed by:   Worthy Keeler LPN on 04/54/0981   Method used:   Electronically to        [facility] (retail)       [address]       [place], [place]  [address]       Ph: [phone]       Fax: [phone]   RxID:   [id number]

## 2010-09-29 NOTE — Letter (Signed)
Summary: xray  xray   Imported By: Curtis Sites 02/03/2010 11:59:42  _____________________________________________________________________  External Attachment:    Type:   Image     Comment:   External Document

## 2010-09-29 NOTE — Progress Notes (Signed)
Summary: ? about peg form [facility]  Phone Note From Other Clinic Call back at Home Phone [phone]   Caller: Lupita Leash from [facility] Summary of Call: [facility] called- their d/c instructions stated they could not use g-tube for feedings untill 05/27/2010 after abd exam. Lupita Leash stated their NP came in and told them that the placement was good and she had good bowel sounds. They want to know if they can start the feedings tonight?  Also, Dietary at [facility] has made pt NPO, but because the pt has no difficulties in swallowing they want to know if she can still have foods and drinks by mouth?  please advise. Initial call taken by: Hendricks Limes LPN,  May 28, 2010 3:04 PM     Appended Document: ? about peg form [facility] I'm not aware they we gave any D/C instructions. I bet those were from radiology since they put in PEG tube. Please find out. Also find out what diet she was on before.  Appended Document: ? about peg form [facility] D/C orders were from Dr. Deanne Coffer with interventional radiology.  OK to use PEG since their NP examined and abd reported to be benign. As per orders from Dr. Deanne Coffer, they need to consult their dietician for tube feeding recommendations and then obtain necessary orders from their house md or attending md. OK to resume previous diet order that she was on prior to peg placement.   SAVE FOR [facility] REVIEW AND ANY ADJUSTMENTS.  Appended Document: ? about peg form [facility] Tommy at [facility] aware

## 2010-09-29 NOTE — Letter (Signed)
Summary: DC SUMMARY/DR HALIM  DC SUMMARY/DR HALIM   Imported By: Diana Eves 11/27/2009 11:56:09  _____________________________________________________________________  External Attachment:    Type:   Image     Comment:   External Document

## 2010-09-29 NOTE — Letter (Signed)
Summary: internal other/progress notes etc/11/12/2009  internal other/progress notes etc/11/12/2009   Imported By: Cloria Spring LPN 69/62/9528 41:32:44  _____________________________________________________________________  External Attachment:    Type:   Image     Comment:   External Document  Appended Document: internal other/progress notes etc/11/12/2009 SEP 2010: 350 LBS, DEC 2010: 312 LBS, Sep 06, 2009: 305 LBS, Oct 01, 2009: 302 LBS, Oct 18, 2009: 304 LBS

## 2010-09-29 NOTE — Miscellaneous (Signed)
Summary: Orders Update  Clinical Lists Changes  Orders: Added new Test order of T-Culture, C-Diff Toxin A/B [phone]) - Signed Added new Test order of T-Culture, C-Diff Toxin A/B [phone]) - Signed Added new Test order of T-Culture, C-Diff Toxin A/B [phone]) - Signed Added new Test order of T-Fecal WBC ([id number]) - Signed Added new Test order of T-Stool Giardia / Crypto- EIA (38101) - Signed Added new Test order of T-CBC w/Diff ([id number]) - Signed Added new Test order of T-Comprehensive Metabolic Panel ([id number]) - Signed

## 2010-09-29 NOTE — Assessment & Plan Note (Signed)
Summary: DYSPHAGIA, DIARRHEA   Visit Type:  Follow-up Visit Primary Care Provider:  Felecia Shelling, M.D.  Chief Complaint:  follow up- family ? need feeding tube.  History of Present Illness: Pt losing weight at [facility]. Weight was 302 lbs earlier this year and daughter reports she weighs 270 lbs. Pt does not want to eat a modified diet and eats very little according to her family. Pt taking 2 cand of Glucerna a day. Pt also has Prostat and Resource beverage  ordered three times a day. Pt having 2-3 loose stools a day. Family would like to consider a feeding tube.    Current Medications (verified): 1)  Omeprazole 20 Mg  Cpdr (Omeprazole) .... Take 1 Tablet By Mouth Once A Day  Stop Nexium 2)  Zolpidem Tartrate 10 Mg  Tabs (Zolpidem Tartrate) .... One Tab By Mouth At Bedtime As Needed 3)  Aspirin 325 Mg Tabs (Aspirin) .... Take 1 Tablet By Mouth Once A Day 4)  Plavix 75 Mg Tabs (Clopidogrel Bisulfate) .... Take 1 Tablet By Mouth Once A Day 5)  Zoloft 50 Mg Tabs (Sertraline Hcl) .... Take 1 Tablet By Mouth Once A Day 6)  Promethazine Hcl 12.5 Mg Tabs (Promethazine Hcl) .... As Needed 7)  Acetaminophen 325 Mg Tabs (Acetaminophen) .... Two Tab Every 4 Hours As Needed 8)  Calcium 500 Mg Tabs (Calcium) .... Once Daily 9)  Multivitamins  Tabs (Multiple Vitamin) .... Once Daily 10)  Lasix 40 Mg Tabs (Furosemide) .... Once Daily 11)  Prostat 60 Cc .... Three Times A Day  Allergies (verified): 1)  ! Pcn  Past History:  Past Medical History: Reviewed history from 09/17/2009 and no changes required. Stroke SEP 2010, right side hemiparesis Occluded L ICA  Current Problems:  CELLULITIS, LEG, LEFT (ICD-682.6) INSOMNIA (ICD-780.52) RESTLESS LEG SYNDROME (ICD-333.94) HYPERLIPIDEMIA (ICD-272.4) GERD (ICD-530.81) OSTEOARTHRITIS, KNEES, BILATERAL (ICD-715.96) PERIPHERAL VASCULAR DISEASE (ICD-443.9) MORBID OBESITY (ICD-278.01) DIABETES MELLITUS, TYPE II (ICD-250.00)  Past Surgical History: Reviewed  history from 11/24/2007 and no changes required. S/p ventral hernia repain and mesh graft (2005)  Social History: disable Single Three children Current Smoker Alcohol use-no Drug use-no Lives at [facility]: [phone]  Review of Systems       JUNE 2011: HB 11.5 PLT 141-248 K 3.1 CR 0.78  JULY 2011: CDIFF x1, ROUTINE STOOL CULTURE NEG  Vital Signs:  Patient profile:   77 year old female Menstrual status:  postmenopausal Temp:     97.5 degrees F oral Pulse rate:   60 / minute BP sitting:   122 / 84  (right arm) Cuff size:   large  Vitals Entered By: Hendricks Limes LPN (March 11, 2010 9:59 AM)  Physical Exam  General:  Well developed, well nourished, no acute distress. Head:  Normocephalic and atraumatic. Eyes:  PERRLA, no icterus. Mouth:  No deformity or lesions. Neck:  Supple; no masses. Lungs:  Clear throughout to auscultation. Heart:  Regular rate and rhythm; no murmurs. Abdomen:  Soft, nontender and nondistended. Normal bowel sounds. Exam limited pt unable to be examined on table due to right hemiparesis. obese.   Extremities:  2+ pedal edema BLE, RUE supported by a brace Neurologic:  Alert, interactive, and  oriented. No new deficits.  Impression & Recommendations:  Problem # 1:  DYSPHAGIA UNSPECIFIED (ICD-787.20) Assessment Unchanged  Discussed management options with the falimiy and pt. She would like to defer PEG at this time. Pt not a candidate for endoscopic PEG placement due to morbid obesity. Weight loss most likey 2o  to decreased caloric intake. The goal should be to maintain adequate nutrition.   Recent labs show near normal HB. Check ALB today and repeat in 3 mos. [facility] will fax results to me. Pt agreed to drink 4 Glucerna daily. Continue Prostat. If pt unable to maintain ALB 3-3.5 then will need PEG placed by radiology. PT SHOULD HAVE CALORIES AND PROTEIN NEEDS calculated based on her ideal body weight. WEIGH PT MONTHLY. OPV IN OCT 2011. Discussed PEG placement  with Dr. Tyron Russell. ABD MESH not a contraindication to percutaneous feeding placement.  TIME SPENT: 45 MINUTES- reviewing record, H&P, disussing management options with pt and family, with Dr. Tyron Russell AND with Deveron Furlong, NP-c [facility].   Orders: Est. Patient Level V ([address])  Problem # 2:  DIARRHEA (ICD-787.91) Assessment: Unchanged Stool studies negative. Check CDIFFx3.

## 2010-09-29 NOTE — Letter (Signed)
Summary: phone  phone   Imported By: Curtis Sites 02/03/2010 11:58:10  _____________________________________________________________________  External Attachment:    Type:   Image     Comment:   External Document

## 2010-09-29 NOTE — Letter (Signed)
Summary: consult  consult   Imported By: Curtis Sites 02/03/2010 11:55:38  _____________________________________________________________________  External Attachment:    Type:   Image     Comment:   External Document

## 2010-09-29 NOTE — Assessment & Plan Note (Signed)
Summary: vomiting/diarrhea/ss   Visit Type:  Initial Consult Primary Care Provider:  Felecia Shelling, M.D.  Chief Complaint:  weight loss/diarrhea.  History of Present Illness: Problems eating because she says she can't swallow, c/o diarrhea and food gets stuck. EGD/dilx2-esophageal stricture or ring by Drs. Russella Dar and [place]. Weight unknown, last measured weight in computer 330lbs SEP 2010. 3 times: watery, no blood. No pain in belly. Doesn't feel full. Tried Maalox, Phenergan, and Ensure to help Sx.   Diarrhea been a problem  since summer 1x/wk and now every day before and after Abx. No meds to slow down diarrhea. Lives in NH and was on Abx for strep throat. Swallowing study done x2 OCT/DEC 2010. No problems with chest pin or SOB.  2 weeks ago vomiting. Still won't eat but no vomiting. Huge problem with nausea. Just in the AM after she eats. Meds for nausea.  Current Medications (verified): 1)  Omeprazole 20 Mg  Cpdr (Omeprazole) .... Take 1 Tablet By Mouth Once A Day 2)  Zolpidem Tartrate 10 Mg  Tabs (Zolpidem Tartrate) .... One Tab By Mouth At Bedtime As Needed 3)  Aspirin 325 Mg Tabs (Aspirin) .... Take 1 Tablet By Mouth Once A Day 4)  Plavix 75 Mg Tabs (Clopidogrel Bisulfate) .... Take 1 Tablet By Mouth Once A Day 5)  Zoloft 50 Mg Tabs (Sertraline Hcl) .... Take 1 Tablet By Mouth Once A Day 6)  Robitussin .... As Needed 7)  Tylenol 325 Mg .... Two Tablets Every 4 Hours As Needed 8)  Promethazine Hcl 12.5 Mg Tabs (Promethazine Hcl) .... As Needed 9)  Immodium .... As Needed 10)  Maalox .... As Needed  Allergies (verified): 1)  ! Pcn  Past History:  Past Medical History: Stroke SEP 2010, right side hemiparesis Occluded L ICA  Current Problems:  CELLULITIS, LEG, LEFT (ICD-682.6) INSOMNIA (ICD-780.52) RESTLESS LEG SYNDROME (ICD-333.94) HYPERLIPIDEMIA (ICD-272.4) GERD (ICD-530.81) OSTEOARTHRITIS, KNEES, BILATERAL (ICD-715.96) PERIPHERAL VASCULAR DISEASE (ICD-443.9) MORBID OBESITY  (ICD-278.01) DIABETES MELLITUS, TYPE II (ICD-250.00)  Vital Signs:  Patient profile:   77 year old female Menstrual status:  postmenopausal Temp:     97.7 degrees F oral Pulse rate:   60 / minute BP sitting:   110 / 78  (left arm) Cuff size:   large  Vitals Entered By: Cloria Spring LPN (September 17, 2009 3:00 PM)  Physical Exam  General:  No acute distress.obese.   Head:  Normocephalic and atraumatic. Eyes:  PERRLA, no icterus. Mouth:  No deformity or lesions, dentition poor. Lungs:  Clear throughout to auscultation. Heart:  Regular rate and rhythm; no murmurs Abdomen:  Soft, nontender and nondistended.  Normal bowel sounds. limited exam pt in WC. obese.   Neurologic:  Alert and  oriented x4;  expressive aphasia. no new deficits  Impression & Recommendations:  Problem # 1:  DIARRHEA (ICD-787.91) Assessment Unchanged Pt reports 3 stools a day. Differential diabetic enteropathy, lactose intolerance, CDIFF, and less likely microscopic colitis, SBBO, or Giardiasis. Pt not a candidate for bowel prep for TCS due to morbid obesity and hemiparesis.  Stool studies. Add Imodium two times a day and lactose free diet. Consider Flex Sig if Sx don't improve. OPV in 2 mos.  Problem # 2:  DYSPHAGIA UNSPECIFIED (ICD-787.20) Assessment: Unchanged Pt has been dilated in the past but now is on ASA and PLAVIX 2o to recent CVA. Pt has know history of oropharyngeal dysphagia. She is on a soft mech pureed diet at the NH. Differential diagnosis includes recurrent stricture/ring or esophageal  motility disorder or uncontrolleed reflux disease. Continue dysphagia diet. Add Zofran prior to breakfast and lunch. Continue Meds Pass. Obtain weights from [facility]. Check labs. BaSw next week. Consider EGD/dil if Sx do not improve but will need to hold ASA and Plavix. Could consider Lovenox prior to EGD.  CC: Dr. Felecia Shelling     Appended Document: vomiting/diarrhea/ss dec 2010 hb 12.6 cr 0.61  Appended Document:  Orders Update-charge    Clinical Lists Changes  Orders: Added new Service order of New Patient Level V ([address]) - Signed      Appended Document: vomiting/diarrhea/ss Please call pt. Her BaSw showed impaired esophagus motility. her feeding tube muscle doesn't move right. She should continue her modified diet and she does not need her esophagus stretched. She also has a small hiatal hernia.  PLEASE OBTAIN RECENTS WEIGHT S FROM HER FACILITY.  Appended Document: vomiting/diarrhea/ss Informed Lupita Leash @ [facility]. She will fax over the weights. She also would like a new order for for Immodium. She order now is for Bid and pt is constipated, she would like to get an order for as needed.  Appended Document: vomiting/diarrhea/ss Please call Lupita Leash. May change Imodium to as needed loose stool or diarrhea.  Appended Document: vomiting/diarrhea/ss Informed Lupita Leash, Nurse @ [facility].  Appended Document: vomiting/diarrhea/ss [facility] WEIGHTS: 2010- SEP 350 LBS NOV 333 LBS DEC 312 LBS JAN 302.6 LBS

## 2010-09-29 NOTE — Letter (Signed)
Summary: PHYSICIANS ORDERS  PHYSICIANS ORDERS   Imported By: Rexene Alberts 06/02/2010 12:23:05  _____________________________________________________________________  External Attachment:    Type:   Image     Comment:   External Document

## 2010-09-29 NOTE — Letter (Signed)
Summary: Internal Other /Physician's Orders  Internal Other /Physician's Orders   Imported By: Cloria Spring LPN 78/46/9629 52:84:13  _____________________________________________________________________  External Attachment:    Type:   Image     Comment:   External Document

## 2010-09-29 NOTE — Assessment & Plan Note (Signed)
Summary: DYSPHAGIA, hypoalbuminemia, loose stools   Visit Type:  Follow-up Visit Primary Care Provider:  Felecia Shelling, M.D.  Chief Complaint:  F/U dysphagia and weight loss.  History of Present Illness: Last weight 250 Lbs. Don't eat anything. "Doesn't want it."  Even if she get things from home she still wont eat because it taste bad. Dosn't get hungry. Sleeps at night: 3 hrs then sleeps 2 hours. Knaps during the day. Swallow and then spits it back up. BM: 3-4 a day.    Spoke with Lurena Joiner and she confirms  that pt eating < 10% of meals and not drinking 4 cans of Glucerna daily. Diet has changed to SM/pureed meats. Weights(LBS): June 283, JULY 274.2, AUG 275, SEP 266.6    Allergies (verified): 1)  ! Pcn  Past History:  Past Medical History: Stroke SEP 2010, right side hemiparesis Occluded L ICA GERD (ICD-530.81) Severe OBESITY  DIABETES MELLITUS, TYPE II (ICD-250.00) Colonoscopy 4/08: SIMPLE ADENOMA, Bx: neg for microscopic colitis. DYSPHAGIA **BPE JAN 2011-severe EMD, no stricture; 2008: BPE: small narrowing of GEJ but 13mm tablet passed. ST eval done, showed some mild-moderate oropharyngeal, moderate-severe esophageal stage dysphagia. Recommended dysphagia chopped with thin liquids.    Current Problems:  CELLULITIS, LEG, LEFT (ICD-682.6) INSOMNIA (ICD-780.52) RESTLESS LEG SYNDROME (ICD-333.94) HYPERLIPIDEMIA (ICD-272.4) OSTEOARTHRITIS, KNEES, BILATERAL (ICD-715.96) PERIPHERAL VASCULAR DISEASE (ICD-443.9)  Social History:  Lives at [facility]: [phone] SPOKE WITH REBECCA-PROSTAT 101 60CC three times a day, diet: mech soft, PUREED MEATS. Usu. eat < 10% tray and Glucerna usu 2-3 a day but ordered for QID Glucerna.   disable Single Three children Current Smoker Alcohol use-no Drug use-no  Review of Systems       SEP 2011: HB 11.3 MCV 90.6 PLT 345 CR 0.66 AST 37 ALT 17 TBILI 0.3 ALB 1.3  I PERSONALLY REVIEWED HER CT A/P MAY 2011 W/ DR. Ty Hilts.  Vital Signs:  Patient  profile:   77 year old female Menstrual status:  postmenopausal Height:      68 inches Weight:      250 pounds BMI:     38.15 Temp:     97.8 degrees F oral BP sitting:   128 / 72  (left arm) Cuff size:   large  Vitals Entered By: Cloria Spring LPN (May 21, 2010 10:18 AM)  Physical Exam  General:  Well developed, well nourished, no acute distress. Head:  Normocephalic and atraumatic. Lungs:  Clear throughout to auscultation. Heart:  Regular rate and rhythm; no murmurs. Abdomen:  Soft, nontender and nondistended. Normal bowel sounds. Exam limited pt unable to be examined on table due to right hemiparesis. obese.    LOWER ABD INCISION WELL HEALED  Impression & Recommendations:  Problem # 1:  DYSPHAGIA UNSPECIFIED (ICD-787.20) Assessment Unchanged 2o to EMD. Pt with poor intake BY MOUTH: multifactorial-depression, EMD. Pt has severe hypoalbuminemia, but remains severely obese. Change Zoloft to Remeron to stimulate appetite and PI:RJJOACZYSA. PEG via RADIOLOGY on MON.  OPV in 2 mos. Explained to pt and daughter the goal is adequate nutrition not to maintain her weight. Will add antimotility agemnt if she deveops increased loose stools with TFs.  Problem # 2:  DIARRHEA (ICD-787.91) Assessment: Unchanged Likley 2o to functional gut disorder or diabetic enteropathy OR ESSENTIALLY A LIQUID DIET. ADD BENEFIBER 2 TSP DAILY. CALL ME IF this increase #BMs/day.  TIME TO CARE FOR PT: 30 MINUTES-h&p, review CT, discussion w/ family and Rebecca   CC: PCP  Appended Document: DYSPHAGIA, hypoalbuminemia, loose stools REMINDER IN COMPUTER  Appended Document: Orders Update    Clinical Lists Changes  Orders: Added new Service order of Est. Patient Level V (44010) - Signed

## 2010-09-29 NOTE — Letter (Signed)
Summary: misc  misc   Imported By: Curtis Sites 02/03/2010 12:03:22  _____________________________________________________________________  External Attachment:    Type:   Image     Comment:   External Document

## 2010-09-29 NOTE — Letter (Signed)
Summary: BARIUM SWALLOW ORDER  BARIUM SWALLOW ORDER   Imported By: Ave Filter 09/19/2009 12:52:04  _____________________________________________________________________  External Attachment:    Type:   Image     Comment:   External Document

## 2010-09-29 NOTE — Letter (Signed)
Summary: progress notes  progress notes   Imported By: Curtis Sites 02/03/2010 11:58:32  _____________________________________________________________________  External Attachment:    Type:   Image     Comment:   External Document

## 2010-09-29 NOTE — Letter (Signed)
Summary: RELEASE OF INFO  RELEASE OF INFO   Imported By: Diana Eves 11/12/2009 16:54:19  _____________________________________________________________________  External Attachment:    Type:   Image     Comment:   External Document

## 2010-09-29 NOTE — Letter (Signed)
Summary: lab  lab   Imported By: Curtis Sites 02/03/2010 11:57:31  _____________________________________________________________________  External Attachment:    Type:   Image     Comment:   External Document

## 2010-09-29 NOTE — Progress Notes (Signed)
Summary: pending labs  ---- Converted from flag ---- ---- 12/01/2009 10:31 AM, Cloria Spring LPN wrote: Stephens Shire called [facility], spoke with receptionist, asked to speak to the  nurse of pt. She said pt is in hospital, for at least a week or more, and the last time she heard she was at [facility].  ---- 12/01/2009 9:10 AM, Leanna Battles. Dixon Boos wrote: Still waiting for labs from [facility], h/h, hemoccults, acetylcholine receptor ------------------------------  Reviewed E-Chart. Patient admitted recently due to multiple infected decubitus ulcers and skin breakdown between abdominal folds. D/C to [facility].  Please schedule f/u OV with Dr. Darrick Penna for 8 weeks.

## 2010-09-29 NOTE — Progress Notes (Signed)
Summary: Phone message from Lupita Leash, nurse at [facility] Note Other Incoming   Caller: Lupita Leash, nurse from [facility] Summary of Call: T/C from Lupita Leash, nurse @ [facility]. Pt has recently transferred back from [facility]. She is having Swallowing Test done today at [facility], then is being admitted to Room 341. She has a foreign body in bladder and will be having that removed as inpatient. Lupita Leash wanted to make Dr. Darrick Penna aware since pt has appt to see Dr. Darrick Penna here in office on Thursday 01/22/2010. She thought maybe Dr. Darrick Penna could see her while inpatient. Pt has to be transported by EMS when she goes to appts.  Initial call taken by: Cloria Spring LPN,  Jan 20, 2010 12:05 PM     Appended Document: Phone message from Lupita Leash, nurse at [facility] [phone] and asked for Melene Plan (speech therapist) in regards to [name] 12035/12/21. LM for her to return my call. Pt is an inpt at [facility].  Appended Document: Phone message from Lupita Leash, nurse at [facility] with [name]. Pt had an MBS yesterday and has oropharyngeal dysphagia. Pt last weight in [facility] 2011: > 300 lbs. Pt's BMI is 45.6 c/w morbid obesity. [name] does not like her modified diet. BSw JAN 2011-diffuse esophageal motility disorder w/ weak primary, secondary, and tertiary waves. No obstruction. Pt will noy benefit from EGD/dilation. She should continue a modified diet. Will discuss w/ [name] and her family. Pt offered a PEG in 2008 at [facility] and declined. If pt would like she could return to [facility] for a second opinion. May need a PEG if she is unable to maintain adequate nutrition.  Appended Document: Phone message from Lupita Leash, nurse at [facility] pt for appt in July 2011, Dx: dysphagia  Appended Document: Phone message from Lupita Leash, nurse at [facility] is aware of appt for 7/13 @ 0945 w/SF

## 2010-09-29 NOTE — Letter (Signed)
Summary: Recall Office Visit  [facility]  [address]   [place], [place] [address]   Phone: [phone]  Fax: [phone]      July 02, 2010   [name] [address] [place], [place]  [address] 06-20-1932   Dear Ms. [facility],   According to our records, it is time for you to schedule a follow-up office visit with [place].   At your convenience, please call [phone] to schedule an office visit. If you have any questions, concerns, or feel that this letter is in error, we would appreciate your call.   Sincerely,    Diana Eves  [facility] Ph: [phone]   Fax: [phone]

## 2010-09-29 NOTE — Letter (Signed)
Summary: PEG TUBE ORDER  PEG TUBE ORDER   Imported By: Ave Filter 05/21/2010 11:12:47  _____________________________________________________________________  External Attachment:    Type:   Image     Comment:   External Document  Appended Document: PEG TUBE ORDER I spoke with Lupita Leash to confirm appt.

## 2010-09-29 NOTE — Assessment & Plan Note (Signed)
Summary: FU OV/DYSPEPSIA.ABD PAIN,HEME+STOOLS.GU   Visit Type:  Follow-up Visit Primary Care Provider:  [place]  Chief Complaint:  F/U dysphagia, abd pain, and heme positive stool.  History of Present Illness: [name] is here for two month f/u. She has h/o dysphagia and diarrhea.  January 21st, 2011 she had BPE which showed diffuse esophageal dysmotility with weak primary peristaltic waves, unable to clear contrast from the thoracic esophagus with the patient horizontal.  Peristalsis aided by repeated swallows, with weak secondary and tertiary waves noted.  Small hiatal hernia incidentally identified.  Smooth appearance of esophageal mucosa without perforation or irregularity.    EGD/ED, 9/08, Dr. Karilyn Cota -->Noncritical ring at [facility] with small sliding hh ad nodular mucosa at GEJ, bx negative. GEJ dilated with baloon up to 20mm without mucosal disruption. 85F maloney dilator was passed and showed mucosal disruption at cervical esophagus indicative of web.  EGD/ED, 1/08, Dr. Russella Dar --> mild esophageal stricture dilated.  She had two  MBSS last fall as well as two in 2008.   Daughter states the patient continues to loose weight because she does not like the soft mechanical diet with pureed meat. She will bring her food like a hamburger and she will eat it until she feels like esophagus is full. Then she stops. She c/o two loose stools per day but no melena or brbpr. Denies abd pain.     Now reportedly heme positive. H/H dropped in the last three months. On 08/13/09, Hgb 14.8. On 10/27/09, Hgb 10.9.   Current Medications (verified): 1)  Omeprazole 20 Mg  Cpdr (Omeprazole) .... Take 1 Tablet By Mouth Once A Day  Stop Nexium 2)  Zolpidem Tartrate 10 Mg  Tabs (Zolpidem Tartrate) .... One Tab By Mouth At Bedtime As Needed 3)  Aspirin 325 Mg Tabs (Aspirin) .... Take 1 Tablet By Mouth Once A Day 4)  Plavix 75 Mg Tabs (Clopidogrel Bisulfate) .... Take 1 Tablet By Mouth Once A Day 5)  Zoloft 50 Mg  Tabs (Sertraline Hcl) .... Take 1 Tablet By Mouth Once A Day 6)  Robitussin .... As Needed 7)  Tylenol 325 Mg .... Two Tablets Every 4 Hours As Needed 8)  Promethazine Hcl 12.5 Mg Tabs (Promethazine Hcl) .... As Needed 9)  Immodium .... As Needed 10)  Maalox .... As Needed 11)  Phenergan  Injection .... As Needed 12)  Immodium .... As Neeeded 13)  Phenergan Tablets  12.5 Mg .... As Needed 14)  Acetaminophen 325 Mg Tabs (Acetaminophen) .... Two Tab Every 4 Hours As Needed 15)  Benefiber  Powd (Wheat Dextrin) .... 2 Tsp By Mouth Bid  Allergies (verified): 1)  ! Pcn  Review of Systems      See HPI  Vital Signs:  Patient profile:   77 year old female Menstrual status:  postmenopausal Height:      65 inches Temp:     97.8 degrees F oral Pulse rate:   64 / minute BP sitting:   110 / 80  (left arm) Cuff size:   regular  Vitals Entered By: Cloria Spring LPN (November 12, 2009 10:24 AM)  Physical Exam  General:  obese.  NAD. Head:  Normocephalic and atraumatic. Eyes:  Sclera nonicteric. Mouth:  OP moist. Lungs:  Clear throughout to auscultation. Heart:  Regular rate and rhythm; no murmurs, rubs,  or bruits. Abdomen:  normal bowel sounds, obese, without guarding, without rebound, and no tenderness.  Difficult to exam due to body habitus and wheelchair bound. Extremities:  2+ pedal edema, bilaterally Neurologic:  Alert and  oriented x4;  g  Skin:  Intact without significant lesions or rashes. Psych:  Alert and cooperative. Normal mood and affect.  Impression & Recommendations:  Problem # 1:  DYSPHAGIA UNSPECIFIED (ICD-787.20)  Dr. Darrick Penna discussed with ST regarding patient. Severe esopgaeal dysmotility with liquids pooling in thoracic esophagus that clear with multiple swallows. Would not advance pts diet past soft mech with pureed meats. Patient avoids eating due to dislike of food preference. Tries to eat "regular" food brought by daughter. They want to give her normal diet.  Reportedly loosing weight. Will retrieve weights from last six months.   Retrieve records from tertiary care w/u if available.   Orders: Est. Patient Level III (45409)  Problem # 2:  DIARRHEA (ICD-787.91)  Limited. On/Off Abx frequently. If diarrhea returns, consider checking C. Diff. Continue Benefiber for now.  Orders: Est. Patient Level III (81191)  Problem # 3:  ANEMIA (ICD-285.9)  New onset. ?heme positive stools. Get records if available. May need EGD+/-TCS.   Orders: Est. Patient Level III (47829)    CC:  [name]   Appended Document: FU OV/DYSPEPSIA.ABD PAIN,HEME+STOOLS.GU Received weights from 9/10. Admission weight was 350.2 pounds. On 10/01/09, weight nadir at 302. On 10/18/09, weight 304.2 pounds.  Patient did not keep appt at [facility] in 2008 for dysphagia. No records available.  Discussed all above with Dr. Darrick Penna and [facility]). Will avoid TCS due to risk of patient aspirating on Golytely. No need for esophageal dilation. Continue soft mechanical diet and aspiration precautions.   1.  Let's check H/H next week, along with three Hemoccults.      If H/H drops or hemoccults positive, could consider EGD. 2. OV with Dr. Darrick Penna in 8 weeks.    Appended Document: FU OV/DYSPEPSIA.ABD PAIN,HEME+STOOLS.GU Let's also get an acetylcholine receptor antibody (lab) done next week with H/H.  Appended Document: FU OV/DYSPEPSIA.ABD PAIN,HEME+STOOLS.GU [name] aware. orders faxed.  Appended Document: FU OV/DYSPEPSIA.ABD PAIN,HEME+STOOLS.GU Oct 29, 2009: HB 10.9 PLT 355  Appended Document: FU OV/DYSPEPSIA.ABD PAIN,HEME+STOOLS.GU TCS 2008: [facility]

## 2010-09-29 NOTE — Letter (Signed)
Summary: Records from [facility]   Reviewed 62 pages of records received from [facility] on patient. Done in 2008.   Colonoscopy 4/08, one 3mm tubular adenoma resected. Random biopsies negative for microscopic colitis. Barium swallow, small narrowing of GEJ but 13mm tablet passed. ST eval done, showed some mild-moderate oropharyngeal, moderate-severe esophageal stage dysphagia. Recommended dysphagia chopped with thin liquids.   Significant weight loss and hypoalbuminemia at the time, contributed to poor oral intake. Patient declined PEG then.  Myasthenia w/u recommended but could not find any records to such w/u.  Appended Document: Records from [facility]  2008: 328.8 LBS

## 2010-10-01 NOTE — Letter (Signed)
Summary: PHYSICIAN ORDERS  PHYSICIAN ORDERS   Imported By: Rexene Alberts 08/12/2010 16:30:30  _____________________________________________________________________  External Attachment:    Type:   Image     Comment:   External Document

## 2010-10-01 NOTE — Assessment & Plan Note (Signed)
Summary: CONSTIPATION, DYSPHAGIA   Visit Type:  Follow-up Visit Primary Care Dmarion Perfect:  [name], M.D.  Chief Complaint:   dysphagia and constipation.  History of Present Illness: Continues on SOFT MECH, PUREED MEAT DIET. PO INTAKE low due to pt preference. She doesn't like her diet. Tolerating tube feeds(Glytrol). Having constipation getting 1L fluid via PEG. No additional concerns.  Current Medications (verified): 1)  Zolpidem Tartrate 5 Mg  Tabs (Zolpidem Tartrate) .... One Tab By Mouth At Bedtime As Needed 2)  Aspirin 81 Mg Tabs (Aspirin) .... Take 1 Tablet By Mouth Once A Day 3)  Plavix 75 Mg Tabs (Clopidogrel Bisulfate) .... Take 1 Tablet By Mouth Once A Day 4)  Promethazine Hcl 12.5 Mg Tabs (Promethazine Hcl) .... As Needed 5)  Acetaminophen 325 Mg Tabs (Acetaminophen) .... Two Tab Every 4 Hours As Needed 6)  Calcium 500 Mg Tabs (Calcium) .... Twice Daily 7)  Multivitamins  Tabs (Multiple Vitamin) With Iron .... Once Daily 8)  Lasix 40 Mg Tabs (Furosemide) .... Once Daily 9)  Prostat 60 Cc .... Three Times A Day 10)  Glytrol  Liqd (Nutritional Supplements) .... Via G-Tube Pump 132ml/hr From  6p-6a 11)  Klor-Con M20 40 Meq Cr-Tabs (Potassium Chloride Crys Cr) .... Take 1 Tablet By Mouth Once A Day 12)  Keppra 100 Mg/ml Soln (Levetiracetam) .... 2.13ml (250mg ) Via Tube Twice Daily 13)  Remeron 15 Mg Tabs (Mirtazapine) .... One Tablet By Mouth At Bedtime 14)  Benefiber  Powd (Wheat Dextrin) .... Two Teaspoons Daily 15)  Glucerna  Liqd (Nutritional Supplements) .... One Can Three Times Daily 16)  Loperamide Hcl 2 Mg Caps (Loperamide Hcl) .... As Directed 17)  Zofran 8 Mg Tabs (Ondansetron Hcl) .... As Needed 18)  Hydroxyzine Pamoate 50 Mg Caps (Hydroxyzine Pamoate) .... As Needed 19)  Ativan 0.5 Mg Tabs (Lorazepam) .... As Needed 20)  Albuterol Sulfate (5 Mg/ml) 0.5% Nebu (Albuterol Sulfate) .... As Directed 21)  Ipratropium-Albuterol 0.5-2.5 (3) Mg/66ml Soln (Ipratropium-Albuterol)  .... As Directed  Allergies (verified): 1)  ! Pcn  Past History:  Past Surgical History: Last updated: 11/24/2007 S/p ventral hernia repain and mesh graft (2005)  Social History: Last updated: 08/11/2010 Lives at [facility] three times a day, diet: mech soft, PUREED MEATS. Still offered Glucerna three times a day.  disable Single Three children Current Smoker Alcohol use-no Drug use-no  Past Medical History: Stroke SEP 2010, right side hemiparesis Occluded L ICA GERD (ICD-530.81) Severe OBESITY  DIABETES MELLITUS, TYPE II (ICD-250.00) Colonoscopy 4/08: SIMPLE ADENOMA, Bx: neg for microscopic colitis. DYSPHAGIA-PEG PLACED BY RADIOLOGY SEP 2011 **BPE JAN 2011-severe EMD, no stricture; 2008: BPE: small narrowing of GEJ but 13mm tablet passed. ST eval done, showed some mild-moderate oropharyngeal, moderate-severe esophageal stage dysphagia. Recommended dysphagia chopped with thin liquids.    Current Problems:  CELLULITIS, LEG, LEFT (ICD-682.6) INSOMNIA (ICD-780.52) RESTLESS LEG SYNDROME (ICD-333.94) HYPERLIPIDEMIA (ICD-272.4) OSTEOARTHRITIS, KNEES, BILATERAL (ICD-715.96) PERIPHERAL VASCULAR DISEASE (ICD-443.9)  Social History: Lives at [facility] three times a day, diet: mech soft, PUREED MEATS. Still offered Glucerna three times a day.  disable Single Three children Current Smoker Alcohol use-no Drug use-no  Vital Signs:  Patient profile:   77 year old female Menstrual status:  postmenopausal Height:      68 inches Temp:     97.7 degrees F oral Pulse rate:   60 / minute BP sitting:   118 / 74  (left arm) Cuff size:   large  Vitals Entered By: Hendricks Limes LPN (  August 11, 2010 11:23 AM)  Physical Exam  General:  Well developed, well nourished, no acute distress. Head:  Normocephalic and atraumatic. Lungs:  Clear throughout to auscultation. Heart:  Regular rate and rhythm; no murmurs. Abdomen:  Soft, nontender and  nondistended. Normal bowel sounds. Exam limited pt unable to be examined on table due to Right hemiparesis. obese.  PEG site w/ drainage. Tube shows accumulation of tube feeds.    Impression & Recommendations:  Problem # 1:  CONSTIPATION (ICD-564.00)  Increase Benefiber to two times a day. Increase water to 4 cups by mouth and 4 cups via PEG. Add Miralax daily. If develops diarrhea, use Miralax every other day. OPV in APR 2012.  CC: PCP  Orders: Est. Patient Level III (29528)  Problem # 2:  DYSPHAGIA UNSPECIFIED (ICD-787.20) Assessment: Unchanged  Pt meeting caloric and protein needs via PEG. May still have pos. Pt not taking significant pos because she does not like the modified diet.  Orders: Est. Patient Level III (41324)

## 2010-11-12 LAB — BASIC METABOLIC PANEL
BUN: 6 mg/dL (ref 6–23)
CO2: 27 mEq/L (ref 19–32)
Calcium: 8.1 mg/dL — ABNORMAL LOW (ref 8.4–10.5)
Chloride: 107 mEq/L (ref 96–112)
Creatinine, Ser: 0.57 mg/dL (ref 0.4–1.2)
GFR calc Af Amer: >60 mL/min (ref >60)
Glucose, Bld: 67 mg/dL — ABNORMAL LOW (ref 70–99)

## 2010-11-12 LAB — PROTIME-INR: Prothrombin Time: 14.9 seconds (ref 11.6–15.2)

## 2010-11-12 LAB — CBC
MCH: 29.6 pg (ref 26.0–34.0)
MCHC: 33.9 g/dL (ref 30.0–36.0)
MCV: 87.1 fL (ref 78.0–100.0)
Platelets: 322 10*3/uL (ref 150–400)
RBC: 4.43 MIL/uL (ref 3.87–5.11)
RDW: 16.8 % — ABNORMAL HIGH (ref 11.5–15.5)

## 2010-11-16 LAB — GLUCOSE, CAPILLARY
Glucose-Capillary: 105 mg/dL — ABNORMAL HIGH (ref 70–99)
Glucose-Capillary: 124 mg/dL — ABNORMAL HIGH (ref 70–99)
Glucose-Capillary: 52 mg/dL — ABNORMAL LOW (ref 70–99)
Glucose-Capillary: 65 mg/dL — ABNORMAL LOW (ref 70–99)
Glucose-Capillary: 73 mg/dL (ref 70–99)
Glucose-Capillary: 75 mg/dL (ref 70–99)
Glucose-Capillary: 75 mg/dL (ref 70–99)
Glucose-Capillary: 81 mg/dL (ref 70–99)
Glucose-Capillary: 97 mg/dL (ref 70–99)
Glucose-Capillary: 99 mg/dL (ref 70–99)

## 2010-11-16 LAB — CBC
Hemoglobin: 10.1 g/dL — ABNORMAL LOW (ref 12.0–15.0)
Hemoglobin: 11.5 g/dL — ABNORMAL LOW (ref 12.0–15.0)
MCHC: 35 g/dL (ref 30.0–36.0)
MCV: 89.2 fL (ref 78.0–100.0)
RBC: 3.53 MIL/uL — ABNORMAL LOW (ref 3.87–5.11)
RBC: 3.56 MIL/uL — ABNORMAL LOW (ref 3.87–5.11)
WBC: 7.8 10*3/uL (ref 4.0–10.5)
WBC: 9 10*3/uL (ref 4.0–10.5)

## 2010-11-16 LAB — BASIC METABOLIC PANEL
BUN: 9 mg/dL (ref 6–23)
CO2: 27 mEq/L (ref 19–32)
CO2: 29 mEq/L (ref 19–32)
Calcium: 7.5 mg/dL — ABNORMAL LOW (ref 8.4–10.5)
Chloride: 101 mEq/L (ref 96–112)
Chloride: 104 mEq/L (ref 96–112)
Creatinine, Ser: 0.69 mg/dL (ref 0.4–1.2)
GFR calc Af Amer: >60 mL/min (ref >60)
GFR calc non Af Amer: >60 mL/min (ref >60)
Glucose, Bld: 115 mg/dL — ABNORMAL HIGH (ref 70–99)
Potassium: 3.5 mEq/L (ref 3.5–5.1)
Sodium: 135 mEq/L (ref 135–145)
Sodium: 138 mEq/L (ref 135–145)

## 2010-11-16 LAB — DIFFERENTIAL
Band Neutrophils: 0 % (ref 0–10)
Basophils Absolute: 0 10*3/uL (ref 0.0–0.1)
Basophils Relative: 0 % (ref 0–1)
Eosinophils Absolute: 0.6 10*3/uL (ref 0.0–0.7)
Eosinophils Relative: 8 % — ABNORMAL HIGH (ref 0–5)
Lymphocytes Relative: 30 % (ref 12–46)
Lymphocytes Relative: 48 % — ABNORMAL HIGH (ref 12–46)
Lymphs Abs: 2.3 10*3/uL (ref 0.7–4.0)
Lymphs Abs: 4.3 10*3/uL — ABNORMAL HIGH (ref 0.7–4.0)
Monocytes Absolute: 0.8 10*3/uL (ref 0.1–1.0)
Monocytes Relative: 9 % (ref 3–12)
Neutro Abs: 3.2 10*3/uL (ref 1.7–7.7)
Neutro Abs: 4.1 10*3/uL (ref 1.7–7.7)
Neutrophils Relative %: 36 % — ABNORMAL LOW (ref 43–77)
Neutrophils Relative %: 49 % (ref 43–77)

## 2010-11-17 LAB — POCT CARDIAC MARKERS
CKMB, poc: <1 ng/mL — ABNORMAL LOW (ref 1.0–8.0)
Troponin i, poc: <0.05 ng/mL (ref 0.00–0.09)

## 2010-11-17 LAB — DIFFERENTIAL
Basophils Absolute: 0.1 10*3/uL (ref 0.0–0.1)
Eosinophils Relative: 1 % (ref 0–5)
Lymphocytes Relative: 56 % — ABNORMAL HIGH (ref 12–46)
Monocytes Absolute: 0.9 10*3/uL (ref 0.1–1.0)
Monocytes Relative: 11 % (ref 3–12)
Neutro Abs: 2.5 10*3/uL (ref 1.7–7.7)

## 2010-11-17 LAB — CBC
HCT: 38.6 % (ref 36.0–46.0)
Hemoglobin: 13.3 g/dL (ref 12.0–15.0)
RBC: 4.28 MIL/uL (ref 3.87–5.11)
RDW: 14.7 % (ref 11.5–15.5)

## 2010-11-17 LAB — COMPREHENSIVE METABOLIC PANEL
Albumin: 1.7 g/dL — ABNORMAL LOW (ref 3.5–5.2)
Alkaline Phosphatase: 151 U/L — ABNORMAL HIGH (ref 39–117)
BUN: 7 mg/dL (ref 6–23)
CO2: 27 mEq/L (ref 19–32)
Chloride: 102 mEq/L (ref 96–112)
GFR calc non Af Amer: >60 mL/min (ref >60)
Potassium: 3.5 mEq/L (ref 3.5–5.1)
Total Bilirubin: 0.2 mg/dL — ABNORMAL LOW (ref 0.3–1.2)

## 2010-11-17 LAB — URINE CULTURE

## 2010-11-17 LAB — URINALYSIS, ROUTINE W REFLEX MICROSCOPIC
Glucose, UA: NEGATIVE mg/dL
Protein, ur: NEGATIVE mg/dL
Specific Gravity, Urine: 1.01 (ref 1.005–1.030)
pH: 5.5 (ref 5.0–8.0)

## 2010-11-17 LAB — URINE MICROSCOPIC-ADD ON

## 2010-11-17 LAB — LIPASE, BLOOD: Lipase: 17 U/L (ref 11–59)

## 2010-11-22 LAB — GLUCOSE, CAPILLARY
Glucose-Capillary: 117 mg/dL — ABNORMAL HIGH (ref 70–99)
Glucose-Capillary: 128 mg/dL — ABNORMAL HIGH (ref 70–99)
Glucose-Capillary: 139 mg/dL — ABNORMAL HIGH (ref 70–99)
Glucose-Capillary: 143 mg/dL — ABNORMAL HIGH (ref 70–99)
Glucose-Capillary: 171 mg/dL — ABNORMAL HIGH (ref 70–99)
Glucose-Capillary: 205 mg/dL — ABNORMAL HIGH (ref 70–99)
Glucose-Capillary: 70 mg/dL (ref 70–99)
Glucose-Capillary: 93 mg/dL (ref 70–99)

## 2010-11-22 LAB — CULTURE, BLOOD (ROUTINE X 2): Report Status: 3292011

## 2010-11-22 LAB — CBC
HCT: 31 % — ABNORMAL LOW (ref 36.0–46.0)
Hemoglobin: 10.8 g/dL — ABNORMAL LOW (ref 12.0–15.0)
MCHC: 34.2 g/dL (ref 30.0–36.0)
MCHC: 34.4 g/dL (ref 30.0–36.0)
MCV: 93.6 fL (ref 78.0–100.0)
MCV: 93.8 fL (ref 78.0–100.0)
Platelets: 310 10*3/uL (ref 150–400)
Platelets: 361 10*3/uL (ref 150–400)
RBC: 3.35 MIL/uL — ABNORMAL LOW (ref 3.87–5.11)
RBC: 4.17 MIL/uL (ref 3.87–5.11)
RDW: 15.8 % — ABNORMAL HIGH (ref 11.5–15.5)
RDW: 16 % — ABNORMAL HIGH (ref 11.5–15.5)
WBC: 11.9 10*3/uL — ABNORMAL HIGH (ref 4.0–10.5)
WBC: 15.2 10*3/uL — ABNORMAL HIGH (ref 4.0–10.5)

## 2010-11-22 LAB — BASIC METABOLIC PANEL
BUN: 13 mg/dL (ref 6–23)
BUN: 14 mg/dL (ref 6–23)
CO2: 29 mEq/L (ref 19–32)
Chloride: 105 mEq/L (ref 96–112)
Creatinine, Ser: 0.68 mg/dL (ref 0.4–1.2)
GFR calc Af Amer: >60 mL/min (ref >60)
GFR calc non Af Amer: >60 mL/min (ref >60)
GFR calc non Af Amer: >60 mL/min (ref >60)
Glucose, Bld: 64 mg/dL — ABNORMAL LOW (ref 70–99)
Glucose, Bld: 77 mg/dL (ref 70–99)
Potassium: 2.9 mEq/L — ABNORMAL LOW (ref 3.5–5.1)
Potassium: 4.3 mEq/L (ref 3.5–5.1)
Sodium: 136 mEq/L (ref 135–145)

## 2010-11-22 LAB — DIFFERENTIAL
Basophils Absolute: 0.1 10*3/uL (ref 0.0–0.1)
Basophils Relative: 1 % (ref 0–1)
Eosinophils Absolute: 0.2 10*3/uL (ref 0.0–0.7)
Eosinophils Relative: 0 % (ref 0–5)
Eosinophils Relative: 1 % (ref 0–5)
Eosinophils Relative: 1 % (ref 0–5)
Lymphocytes Relative: 17 % (ref 12–46)
Lymphocytes Relative: 48 % — ABNORMAL HIGH (ref 12–46)
Lymphs Abs: 2.5 10*3/uL (ref 0.7–4.0)
Lymphs Abs: 5.7 10*3/uL — ABNORMAL HIGH (ref 0.7–4.0)
Monocytes Relative: 2 % — ABNORMAL LOW (ref 3–12)
Neutrophils Relative %: 49 % (ref 43–77)
Neutrophils Relative %: 81 % — ABNORMAL HIGH (ref 43–77)

## 2010-11-22 LAB — VANCOMYCIN, TROUGH
Vancomycin Tr: 27.2 ug/mL (ref 10.0–20.0)
Vancomycin Tr: 43.1 ug/mL (ref 10.0–20.0)

## 2010-11-22 LAB — COMPREHENSIVE METABOLIC PANEL
AST: 25 U/L (ref 0–37)
CO2: 24 mEq/L (ref 19–32)
Calcium: 7.4 mg/dL — ABNORMAL LOW (ref 8.4–10.5)
Creatinine, Ser: 0.79 mg/dL (ref 0.4–1.2)
GFR calc Af Amer: >60 mL/min (ref >60)
GFR calc non Af Amer: >60 mL/min (ref >60)
Sodium: 134 mEq/L — ABNORMAL LOW (ref 135–145)
Total Protein: 5.4 g/dL — ABNORMAL LOW (ref 6.0–8.3)

## 2010-11-22 LAB — URINALYSIS, ROUTINE W REFLEX MICROSCOPIC
Leukocytes, UA: NEGATIVE
Nitrite: POSITIVE — AB
Specific Gravity, Urine: 1.015 (ref 1.005–1.030)
Urobilinogen, UA: 0.2 mg/dL (ref 0.0–1.0)
pH: 5 (ref 5.0–8.0)

## 2010-11-22 LAB — URINE MICROSCOPIC-ADD ON

## 2010-12-01 LAB — DIFFERENTIAL
Basophils Relative: 0 % (ref 0–1)
Eosinophils Absolute: 0.4 10*3/uL (ref 0.0–0.7)
Eosinophils Relative: 5 % (ref 0–5)
Lymphs Abs: 2.3 10*3/uL (ref 0.7–4.0)
Monocytes Relative: 5 % (ref 3–12)
Neutrophils Relative %: 62 % (ref 43–77)

## 2010-12-01 LAB — POCT CARDIAC MARKERS
CKMB, poc: <1 ng/mL — ABNORMAL LOW (ref 1.0–8.0)
Myoglobin, poc: 116 ng/mL (ref 12–200)
Myoglobin, poc: 160 ng/mL (ref 12–200)
Troponin i, poc: <0.05 ng/mL (ref 0.00–0.09)

## 2010-12-01 LAB — BASIC METABOLIC PANEL
BUN: 5 mg/dL — ABNORMAL LOW (ref 6–23)
CO2: 25 mEq/L (ref 19–32)
Chloride: 101 mEq/L (ref 96–112)
Creatinine, Ser: 0.68 mg/dL (ref 0.4–1.2)
Potassium: 3.7 mEq/L (ref 3.5–5.1)

## 2010-12-01 LAB — CBC
HCT: 44.8 % (ref 36.0–46.0)
MCHC: 33 g/dL (ref 30.0–36.0)
MCV: 91.8 fL (ref 78.0–100.0)
RBC: 4.89 MIL/uL (ref 3.87–5.11)
WBC: 8.3 10*3/uL (ref 4.0–10.5)

## 2010-12-04 LAB — GLUCOSE, CAPILLARY
Glucose-Capillary: 67 mg/dL — ABNORMAL LOW (ref 70–99)
Glucose-Capillary: 82 mg/dL (ref 70–99)
Glucose-Capillary: 100 mg/dL — ABNORMAL HIGH (ref 70–99)
Glucose-Capillary: 100 mg/dL — ABNORMAL HIGH (ref 70–99)
Glucose-Capillary: 101 mg/dL — ABNORMAL HIGH (ref 70–99)
Glucose-Capillary: 103 mg/dL — ABNORMAL HIGH (ref 70–99)
Glucose-Capillary: 105 mg/dL — ABNORMAL HIGH (ref 70–99)
Glucose-Capillary: 108 mg/dL — ABNORMAL HIGH (ref 70–99)
Glucose-Capillary: 109 mg/dL — ABNORMAL HIGH (ref 70–99)
Glucose-Capillary: 113 mg/dL — ABNORMAL HIGH (ref 70–99)
Glucose-Capillary: 121 mg/dL — ABNORMAL HIGH (ref 70–99)
Glucose-Capillary: 42 mg/dL — ABNORMAL LOW (ref 70–99)
Glucose-Capillary: 73 mg/dL (ref 70–99)
Glucose-Capillary: 74 mg/dL (ref 70–99)
Glucose-Capillary: 74 mg/dL (ref 70–99)
Glucose-Capillary: 75 mg/dL (ref 70–99)
Glucose-Capillary: 78 mg/dL (ref 70–99)
Glucose-Capillary: 81 mg/dL (ref 70–99)
Glucose-Capillary: 83 mg/dL (ref 70–99)
Glucose-Capillary: 83 mg/dL (ref 70–99)
Glucose-Capillary: 85 mg/dL (ref 70–99)
Glucose-Capillary: 86 mg/dL (ref 70–99)
Glucose-Capillary: 86 mg/dL (ref 70–99)
Glucose-Capillary: 87 mg/dL (ref 70–99)
Glucose-Capillary: 87 mg/dL (ref 70–99)
Glucose-Capillary: 87 mg/dL (ref 70–99)
Glucose-Capillary: 87 mg/dL (ref 70–99)
Glucose-Capillary: 88 mg/dL (ref 70–99)
Glucose-Capillary: 90 mg/dL (ref 70–99)
Glucose-Capillary: 91 mg/dL (ref 70–99)
Glucose-Capillary: 97 mg/dL (ref 70–99)
Glucose-Capillary: 98 mg/dL (ref 70–99)

## 2010-12-04 LAB — BASIC METABOLIC PANEL
Calcium: 9 mg/dL (ref 8.4–10.5)
Calcium: 8.7 mg/dL (ref 8.4–10.5)
Creatinine, Ser: 0.91 mg/dL (ref 0.4–1.2)
Creatinine, Ser: 0.89 mg/dL (ref 0.4–1.2)
GFR calc Af Amer: >60 mL/min (ref >60)
GFR calc Af Amer: >60 mL/min (ref >60)
GFR calc non Af Amer: >60 mL/min (ref >60)
GFR calc non Af Amer: >60 mL/min (ref >60)
Glucose, Bld: 83 mg/dL (ref 70–99)
Sodium: 141 mEq/L (ref 135–145)
Sodium: 143 mEq/L (ref 135–145)

## 2010-12-04 LAB — DIFFERENTIAL
Basophils Relative: 1 % (ref 0–1)
Lymphocytes Relative: 39 % (ref 12–46)
Lymphs Abs: 2.8 10*3/uL (ref 0.7–4.0)
Monocytes Absolute: 0.3 10*3/uL (ref 0.1–1.0)
Monocytes Relative: 5 % (ref 3–12)
Neutro Abs: 3.5 10*3/uL (ref 1.7–7.7)
Neutrophils Relative %: 49 % (ref 43–77)

## 2010-12-04 LAB — URINALYSIS, MICROSCOPIC ONLY
Glucose, UA: NEGATIVE mg/dL
Leukocytes, UA: NEGATIVE
Nitrite: NEGATIVE
Specific Gravity, Urine: 1.016 (ref 1.005–1.030)
pH: 6 (ref 5.0–8.0)

## 2010-12-04 LAB — CBC
Hemoglobin: 13.4 g/dL (ref 12.0–15.0)
Hemoglobin: 12.5 g/dL (ref 12.0–15.0)
MCHC: 32.8 g/dL (ref 30.0–36.0)
MCHC: 33.3 g/dL (ref 30.0–36.0)
RBC: 4.49 MIL/uL (ref 3.87–5.11)
RBC: 4.13 MIL/uL (ref 3.87–5.11)
RDW: 15 % (ref 11.5–15.5)
WBC: 7.2 10*3/uL (ref 4.0–10.5)

## 2010-12-04 LAB — PROTIME-INR: INR: 1.1 (ref 0.00–1.49)

## 2010-12-04 LAB — URINE CULTURE: Colony Count: >=100000

## 2010-12-04 LAB — HEMOGLOBIN A1C
Hgb A1c MFr Bld: 6.7 % — ABNORMAL HIGH (ref 4.6–6.1)
Mean Plasma Glucose: 146 mg/dL

## 2010-12-05 LAB — CBC
Hemoglobin: 13.3 g/dL (ref 12.0–15.0)
MCHC: 32.5 g/dL (ref 30.0–36.0)
MCV: 90.2 fL (ref 78.0–100.0)
Platelets: 292 10*3/uL (ref 150–400)
RBC: 4.35 MIL/uL (ref 3.87–5.11)
RBC: 4.53 MIL/uL (ref 3.87–5.11)
WBC: 7.3 10*3/uL (ref 4.0–10.5)
WBC: 9.1 10*3/uL (ref 4.0–10.5)

## 2010-12-05 LAB — TYPE AND SCREEN: ABO/RH(D): O POS

## 2010-12-05 LAB — GLUCOSE, CAPILLARY
Glucose-Capillary: 101 mg/dL — ABNORMAL HIGH (ref 70–99)
Glucose-Capillary: 120 mg/dL — ABNORMAL HIGH (ref 70–99)
Glucose-Capillary: 75 mg/dL (ref 70–99)
Glucose-Capillary: 92 mg/dL (ref 70–99)
Glucose-Capillary: 95 mg/dL (ref 70–99)
Glucose-Capillary: 99 mg/dL (ref 70–99)

## 2010-12-05 LAB — URINALYSIS, ROUTINE W REFLEX MICROSCOPIC
Glucose, UA: NEGATIVE mg/dL
Ketones, ur: NEGATIVE mg/dL
Nitrite: NEGATIVE
Specific Gravity, Urine: >1.046 — ABNORMAL HIGH (ref 1.005–1.030)
pH: 6 (ref 5.0–8.0)

## 2010-12-05 LAB — DIFFERENTIAL
Lymphocytes Relative: 34 % (ref 12–46)
Lymphs Abs: 2.5 10*3/uL (ref 0.7–4.0)
Monocytes Relative: 2 % — ABNORMAL LOW (ref 3–12)
Neutro Abs: 4.4 10*3/uL (ref 1.7–7.7)
Neutrophils Relative %: 60 % (ref 43–77)

## 2010-12-05 LAB — BASIC METABOLIC PANEL
BUN: 16 mg/dL (ref 6–23)
Calcium: 9.1 mg/dL (ref 8.4–10.5)
Chloride: 104 mEq/L (ref 96–112)
Creatinine, Ser: 0.92 mg/dL (ref 0.4–1.2)
GFR calc Af Amer: >60 mL/min (ref >60)
GFR calc non Af Amer: 60 mL/min — ABNORMAL LOW (ref >60)

## 2010-12-05 LAB — PROTIME-INR: Prothrombin Time: 13.4 seconds (ref 11.6–15.2)

## 2010-12-05 LAB — COMPREHENSIVE METABOLIC PANEL
ALT: 14 U/L (ref 0–35)
AST: 18 U/L (ref 0–37)
CO2: 27 mEq/L (ref 19–32)
Calcium: 9.3 mg/dL (ref 8.4–10.5)
Chloride: 102 mEq/L (ref 96–112)
Creatinine, Ser: 0.9 mg/dL (ref 0.4–1.2)
GFR calc non Af Amer: >60 mL/min (ref >60)
Glucose, Bld: 102 mg/dL — ABNORMAL HIGH (ref 70–99)
Total Bilirubin: 0.6 mg/dL (ref 0.3–1.2)

## 2010-12-05 LAB — LIPID PANEL
Cholesterol: 112 mg/dL (ref 0–200)
LDL Cholesterol: 55 mg/dL (ref 0–99)

## 2010-12-05 LAB — APTT: aPTT: 25 seconds (ref 24–37)

## 2010-12-05 LAB — URINE CULTURE: Colony Count: 1000

## 2010-12-05 LAB — ABO/RH: ABO/RH(D): O POS

## 2010-12-05 LAB — HEMOGLOBIN A1C: Mean Plasma Glucose: 137 mg/dL

## 2010-12-06 LAB — BASIC METABOLIC PANEL
CO2: 29 mEq/L (ref 19–32)
Chloride: 107 mEq/L (ref 96–112)
Creatinine, Ser: 0.91 mg/dL (ref 0.4–1.2)
GFR calc Af Amer: >60 mL/min (ref >60)
GFR calc Af Amer: >60 mL/min (ref >60)
GFR calc non Af Amer: 56 mL/min — ABNORMAL LOW (ref >60)
Glucose, Bld: 92 mg/dL (ref 70–99)
Potassium: 3.9 mEq/L (ref 3.5–5.1)
Sodium: 138 mEq/L (ref 135–145)

## 2010-12-06 LAB — GLUCOSE, CAPILLARY
Glucose-Capillary: 102 mg/dL — ABNORMAL HIGH (ref 70–99)
Glucose-Capillary: 124 mg/dL — ABNORMAL HIGH (ref 70–99)
Glucose-Capillary: 126 mg/dL — ABNORMAL HIGH (ref 70–99)
Glucose-Capillary: 134 mg/dL — ABNORMAL HIGH (ref 70–99)
Glucose-Capillary: 153 mg/dL — ABNORMAL HIGH (ref 70–99)
Glucose-Capillary: 183 mg/dL — ABNORMAL HIGH (ref 70–99)
Glucose-Capillary: 96 mg/dL (ref 70–99)
Glucose-Capillary: 97 mg/dL (ref 70–99)

## 2010-12-06 LAB — COMPREHENSIVE METABOLIC PANEL
BUN: 12 mg/dL (ref 6–23)
CO2: 26 mEq/L (ref 19–32)
Calcium: 8.5 mg/dL (ref 8.4–10.5)
Creatinine, Ser: 0.86 mg/dL (ref 0.4–1.2)
GFR calc non Af Amer: >60 mL/min (ref >60)
Glucose, Bld: 80 mg/dL (ref 70–99)
Sodium: 138 mEq/L (ref 135–145)
Total Protein: 6.5 g/dL (ref 6.0–8.3)

## 2010-12-06 LAB — CBC
HCT: 35.7 % — ABNORMAL LOW (ref 36.0–46.0)
HCT: 38.1 % (ref 36.0–46.0)
Hemoglobin: 12 g/dL (ref 12.0–15.0)
Hemoglobin: 12.6 g/dL (ref 12.0–15.0)
MCHC: 33.4 g/dL (ref 30.0–36.0)
MCV: 90.3 fL (ref 78.0–100.0)
MCV: 90.5 fL (ref 78.0–100.0)
Platelets: 233 10*3/uL (ref 150–400)
Platelets: 304 10*3/uL (ref 150–400)
RBC: 3.95 MIL/uL (ref 3.87–5.11)
RBC: 3.95 MIL/uL (ref 3.87–5.11)
RBC: 4.21 MIL/uL (ref 3.87–5.11)
RDW: 15.5 % (ref 11.5–15.5)
WBC: 7 10*3/uL (ref 4.0–10.5)
WBC: 7.3 10*3/uL (ref 4.0–10.5)

## 2010-12-06 LAB — DIFFERENTIAL
Basophils Absolute: 0 10*3/uL (ref 0.0–0.1)
Basophils Relative: 1 % (ref 0–1)
Basophils Relative: 1 % (ref 0–1)
Eosinophils Absolute: 0.5 10*3/uL (ref 0.0–0.7)
Eosinophils Absolute: 0.5 10*3/uL (ref 0.0–0.7)
Monocytes Absolute: 0.6 10*3/uL (ref 0.1–1.0)
Monocytes Relative: 8 % (ref 3–12)
Neutrophils Relative %: 37 % — ABNORMAL LOW (ref 43–77)
Neutrophils Relative %: 53 % (ref 43–77)

## 2010-12-06 LAB — LIPID PANEL
HDL: 46 mg/dL (ref >39)
LDL Cholesterol: 57 mg/dL (ref 0–99)
Total CHOL/HDL Ratio: 2.5 RATIO
Triglycerides: 60 mg/dL (ref <150)
VLDL: 12 mg/dL (ref 0–40)

## 2010-12-06 LAB — HEMOGLOBIN A1C: Mean Plasma Glucose: 131 mg/dL

## 2010-12-08 LAB — CBC
HCT: 32 % — ABNORMAL LOW (ref 36.0–46.0)
HCT: 32.4 % — ABNORMAL LOW (ref 36.0–46.0)
HCT: 36.5 % (ref 36.0–46.0)
Hemoglobin: 10.7 g/dL — ABNORMAL LOW (ref 12.0–15.0)
MCV: 89 fL (ref 78.0–100.0)
MCV: 89 fL (ref 78.0–100.0)
Platelets: 259 10*3/uL (ref 150–400)
Platelets: 284 10*3/uL (ref 150–400)
RBC: 3.58 MIL/uL — ABNORMAL LOW (ref 3.87–5.11)
RBC: 4.1 MIL/uL (ref 3.87–5.11)
RDW: 15.1 % (ref 11.5–15.5)
WBC: 12.8 10*3/uL — ABNORMAL HIGH (ref 4.0–10.5)
WBC: 17.4 10*3/uL — ABNORMAL HIGH (ref 4.0–10.5)

## 2010-12-08 LAB — DIFFERENTIAL
Basophils Absolute: 0 10*3/uL (ref 0.0–0.1)
Basophils Absolute: 0 K/uL (ref 0.0–0.1)
Basophils Absolute: 0 K/uL (ref 0.0–0.1)
Basophils Relative: 0 % (ref 0–1)
Basophils Relative: 0 % (ref 0–1)
Basophils Relative: 0 % (ref 0–1)
Basophils Relative: 0 % (ref 0–1)
Eosinophils Absolute: 0 10*3/uL (ref 0.0–0.7)
Eosinophils Absolute: 0.1 10*3/uL (ref 0.0–0.7)
Eosinophils Absolute: 0.3 K/uL (ref 0.0–0.7)
Eosinophils Absolute: 0.6 K/uL (ref 0.0–0.7)
Eosinophils Relative: 0 % (ref 0–5)
Eosinophils Relative: 2 % (ref 0–5)
Eosinophils Relative: 5 % (ref 0–5)
Lymphocytes Relative: 16 % (ref 12–46)
Lymphocytes Relative: 20 % (ref 12–46)
Lymphocytes Relative: 7 % — ABNORMAL LOW (ref 12–46)
Lymphs Abs: 2.4 10*3/uL (ref 0.7–4.0)
Lymphs Abs: 2.4 K/uL (ref 0.7–4.0)
Lymphs Abs: 2.6 K/uL (ref 0.7–4.0)
Monocytes Absolute: 0.5 10*3/uL (ref 0.1–1.0)
Monocytes Absolute: 0.9 K/uL (ref 0.1–1.0)
Monocytes Absolute: 1.1 K/uL — ABNORMAL HIGH (ref 0.1–1.0)
Monocytes Relative: 3 % (ref 3–12)
Monocytes Relative: 6 % (ref 3–12)
Monocytes Relative: 9 % (ref 3–12)
Neutro Abs: 11.8 K/uL — ABNORMAL HIGH (ref 1.7–7.7)
Neutro Abs: 15.7 10*3/uL — ABNORMAL HIGH (ref 1.7–7.7)
Neutro Abs: 8.3 K/uL — ABNORMAL HIGH (ref 1.7–7.7)
Neutrophils Relative %: 65 % (ref 43–77)
Neutrophils Relative %: 77 % (ref 43–77)

## 2010-12-08 LAB — URINALYSIS, ROUTINE W REFLEX MICROSCOPIC
Bilirubin Urine: NEGATIVE
Ketones, ur: NEGATIVE mg/dL
Nitrite: NEGATIVE
Urobilinogen, UA: 0.2 mg/dL (ref 0.0–1.0)

## 2010-12-08 LAB — COMPREHENSIVE METABOLIC PANEL
ALT: 10 U/L (ref 0–35)
ALT: 11 U/L (ref 0–35)
AST: 17 U/L (ref 0–37)
Albumin: 2 g/dL — ABNORMAL LOW (ref 3.5–5.2)
Alkaline Phosphatase: 74 U/L (ref 39–117)
CO2: 25 mEq/L (ref 19–32)
Calcium: 7.9 mg/dL — ABNORMAL LOW (ref 8.4–10.5)
Chloride: 103 mEq/L (ref 96–112)
Creatinine, Ser: 1.29 mg/dL — ABNORMAL HIGH (ref 0.4–1.2)
GFR calc Af Amer: 49 mL/min — ABNORMAL LOW (ref >60)
GFR calc non Af Amer: 40 mL/min — ABNORMAL LOW (ref >60)
Potassium: 3.3 mEq/L — ABNORMAL LOW (ref 3.5–5.1)
Sodium: 136 mEq/L (ref 135–145)
Total Bilirubin: 0.4 mg/dL (ref 0.3–1.2)
Total Protein: 5.8 g/dL — ABNORMAL LOW (ref 6.0–8.3)

## 2010-12-08 LAB — BASIC METABOLIC PANEL
Calcium: 7.9 mg/dL — ABNORMAL LOW (ref 8.4–10.5)
GFR calc Af Amer: 59 mL/min — ABNORMAL LOW (ref >60)
GFR calc non Af Amer: 49 mL/min — ABNORMAL LOW (ref >60)
Potassium: 3.7 mEq/L (ref 3.5–5.1)
Sodium: 136 mEq/L (ref 135–145)

## 2010-12-08 LAB — CULTURE, BLOOD (ROUTINE X 2)

## 2010-12-08 LAB — BASIC METABOLIC PANEL WITH GFR
BUN: 10 mg/dL (ref 6–23)
CO2: 25 meq/L (ref 19–32)
Calcium: 7.9 mg/dL — ABNORMAL LOW (ref 8.4–10.5)
Chloride: 106 meq/L (ref 96–112)
Creatinine, Ser: 1.12 mg/dL (ref 0.4–1.2)
GFR calc Af Amer: 57 mL/min — ABNORMAL LOW (ref >60)
GFR calc non Af Amer: 47 mL/min — ABNORMAL LOW (ref >60)
Glucose, Bld: 90 mg/dL (ref 70–99)
Potassium: 3.3 meq/L — ABNORMAL LOW (ref 3.5–5.1)
Sodium: 137 meq/L (ref 135–145)

## 2010-12-08 LAB — WET PREP, GENITAL
Trich, Wet Prep: NONE SEEN
Yeast Wet Prep HPF POC: NONE SEEN

## 2010-12-08 LAB — GLUCOSE, CAPILLARY
Glucose-Capillary: 103 mg/dL — ABNORMAL HIGH (ref 70–99)
Glucose-Capillary: 104 mg/dL — ABNORMAL HIGH (ref 70–99)
Glucose-Capillary: 111 mg/dL — ABNORMAL HIGH (ref 70–99)
Glucose-Capillary: 112 mg/dL — ABNORMAL HIGH (ref 70–99)
Glucose-Capillary: 113 mg/dL — ABNORMAL HIGH (ref 70–99)
Glucose-Capillary: 121 mg/dL — ABNORMAL HIGH (ref 70–99)
Glucose-Capillary: 130 mg/dL — ABNORMAL HIGH (ref 70–99)
Glucose-Capillary: 144 mg/dL — ABNORMAL HIGH (ref 70–99)
Glucose-Capillary: 151 mg/dL — ABNORMAL HIGH (ref 70–99)
Glucose-Capillary: 70 mg/dL (ref 70–99)
Glucose-Capillary: 81 mg/dL (ref 70–99)
Glucose-Capillary: 84 mg/dL (ref 70–99)
Glucose-Capillary: 92 mg/dL (ref 70–99)
Glucose-Capillary: 94 mg/dL (ref 70–99)
Glucose-Capillary: 98 mg/dL (ref 70–99)

## 2010-12-08 LAB — HEMOGLOBIN A1C
Hgb A1c MFr Bld: 7 % — ABNORMAL HIGH (ref 4.6–6.1)
Mean Plasma Glucose: 154 mg/dL

## 2010-12-08 LAB — VANCOMYCIN, TROUGH: Vancomycin Tr: 14.4 ug/mL (ref 10.0–20.0)

## 2010-12-08 LAB — URINE CULTURE: Colony Count: NO GROWTH

## 2010-12-08 LAB — BRAIN NATRIURETIC PEPTIDE: Pro B Natriuretic peptide (BNP): <30 pg/mL (ref 0.0–100.0)

## 2010-12-08 LAB — KOH PREP: KOH Prep: NONE SEEN

## 2010-12-31 ENCOUNTER — Ambulatory Visit (INDEPENDENT_AMBULATORY_CARE_PROVIDER_SITE_OTHER): Payer: PRIVATE HEALTH INSURANCE | Admitting: Gastroenterology

## 2010-12-31 ENCOUNTER — Encounter: Payer: Self-pay | Admitting: Gastroenterology

## 2010-12-31 DIAGNOSIS — R1310 Dysphagia, unspecified: Secondary | ICD-10-CM

## 2010-12-31 DIAGNOSIS — K5900 Constipation, unspecified: Secondary | ICD-10-CM

## 2010-12-31 DIAGNOSIS — R197 Diarrhea, unspecified: Secondary | ICD-10-CM

## 2010-12-31 NOTE — Assessment & Plan Note (Addendum)
FREQUENT LOOSE STOOLS ON TUBE FEEDS.  CHANGE GAVILAX TO PRN. Pt having 2-3 BMS a day.

## 2010-12-31 NOTE — Patient Instructions (Signed)
Intermittent difficulty with swallowing most likely related to prior stroke. Continue tube feeds and soft mechanical diet. FOLLOW UP IN 6 MOS. CHANGE GAVILAX TO PRN. Pt having 2-3 BMS a day.

## 2010-12-31 NOTE — Progress Notes (Signed)
Pt is aware of her OV on 05/05/11 @ 10 am with SF (E30 visit per SF)

## 2010-12-31 NOTE — Assessment & Plan Note (Signed)
Intermittent difficulty with swallowing most likely related to prior stroke. Continue tube feeds and soft mechanical diet. FOLLOW UP IN 4 MOS.

## 2010-12-31 NOTE — Progress Notes (Signed)
  Subjective:    Patient ID: [name], female    DOB: Jun 07, 1932, 77 y.o.   MRN: [id number]  PCP: Felecia Shelling  HPI Sometimes chews food and can't get it down. Most of the time problem with dry meat. Feels a whole lot better. Eating better. 2-3 BMs a day.   Past Medical History  Diagnosis Date  . GERD (gastroesophageal reflux disease)   . Dysphagia 2008    LAST BPE 2011: SEVERE EMD  . Stroke 2010    R HEMIPARESIS  . Left carotid artery occlusion   . Obesity, morbid (more than 100 lbs over ideal weight or BMI > 40)   . Diabetes mellitus   . Encounter for PEG (percutaneous endoscopic gastrostomy) SEP 2011 [facility] RADS  . Restless leg syndrome   . Hyperlipemia   . PVD (peripheral vascular disease)    Past Surgical History  Procedure Date  . Colonoscopy 2008    SIMPLE ADENOMA, bX: NEG FOR MICROSCOPIC COLITIS  . Ventral hernia repair 2005  . Upper gastrointestinal endoscopy 2005 NUR    W/ DILATATION   History   Social History  . Marital Status: Single    Spouse Name: N/A    Number of Children: N/A  . Years of Education: N/A   Social History Main Topics  . Smoking status: Former Games developer  . Smokeless tobacco: None  . Alcohol Use: No  . Drug Use: No  . Sexually Active: None   Other Topics Concern  . None   Social History Narrative   Lives at [place] on a soft mech diet with pureed meat. 3 children-Tracey comes to visits.      Review of Systems     Objective:   Physical Exam  Constitutional: She appears well-developed and well-nourished. No distress.  HENT:  Head: Normocephalic and atraumatic.  Cardiovascular: Normal rate and regular rhythm.   Pulmonary/Chest: Effort normal and breath sounds normal.  Abdominal: Bowel sounds are normal. She exhibits no distension. There is no tenderness. There is no rebound.       PEG SITE DRY W/O ERYTHEMA. TUBE DISCOLORED BY FEEDS.  Musculoskeletal: She exhibits edema.  Neurological: She is alert.          Assessment & Plan:

## 2010-12-31 NOTE — Progress Notes (Signed)
Cc to PCP 

## 2010-12-31 NOTE — Assessment & Plan Note (Signed)
RESOLVED.  CONTINUE BENEFIBER.

## 2011-01-08 ENCOUNTER — Emergency Department (HOSPITAL_COMMUNITY)
Admission: EM | Admit: 2011-01-08 | Discharge: 2011-01-08 | Disposition: A | Discharge status: Home / Self Care | Payer: PRIVATE HEALTH INSURANCE | Attending: Emergency Medicine | Admitting: Emergency Medicine

## 2011-01-08 DIAGNOSIS — I10 Essential (primary) hypertension: Secondary | ICD-10-CM | POA: Insufficient documentation

## 2011-01-08 DIAGNOSIS — I252 Old myocardial infarction: Secondary | ICD-10-CM | POA: Insufficient documentation

## 2011-01-08 DIAGNOSIS — A499 Bacterial infection, unspecified: Secondary | ICD-10-CM | POA: Insufficient documentation

## 2011-01-08 DIAGNOSIS — N898 Other specified noninflammatory disorders of vagina: Secondary | ICD-10-CM | POA: Insufficient documentation

## 2011-01-08 DIAGNOSIS — N760 Acute vaginitis: Secondary | ICD-10-CM | POA: Insufficient documentation

## 2011-01-08 DIAGNOSIS — I2510 Atherosclerotic heart disease of native coronary artery without angina pectoris: Secondary | ICD-10-CM | POA: Insufficient documentation

## 2011-01-08 DIAGNOSIS — E119 Type 2 diabetes mellitus without complications: Secondary | ICD-10-CM | POA: Insufficient documentation

## 2011-01-08 DIAGNOSIS — Z79899 Other long term (current) drug therapy: Secondary | ICD-10-CM | POA: Insufficient documentation

## 2011-01-08 DIAGNOSIS — B9689 Other specified bacterial agents as the cause of diseases classified elsewhere: Secondary | ICD-10-CM | POA: Insufficient documentation

## 2011-01-08 DIAGNOSIS — I69959 Hemiplegia and hemiparesis following unspecified cerebrovascular disease affecting unspecified side: Secondary | ICD-10-CM | POA: Insufficient documentation

## 2011-01-08 LAB — WET PREP, GENITAL
Clue Cells Wet Prep HPF POC: NONE SEEN
Trich, Wet Prep: NONE SEEN

## 2011-01-11 LAB — GC/CHLAMYDIA PROBE AMP, GENITAL
Chlamydia, DNA Probe: NEGATIVE
GC Probe Amp, Genital: NEGATIVE

## 2011-01-12 NOTE — H&P (Signed)
[name], [name]               ACCOUNT NO.:  [id number]   MEDICAL RECORD NO.:  [id number]          PATIENT TYPE:  INP   LOCATION:  A306                          FACILITY:  [facility]   PHYSICIAN:  Lonia Blood, M.D.      DATE OF BIRTH:  10/19/1931   DATE OF ADMISSION:  03/15/2009  DATE OF DISCHARGE:  LH                              HISTORY & PHYSICAL   PRIMARY CARE PHYSICIAN:  Milus Mallick. Lodema Hong, MD   PRESENTING COMPLAINT:  Right upper extremity weakness.   HISTORY OF PRESENT ILLNESS:  The patient is a 77 year old African  American female, who has apparently been doing well until 2 days ago  when she started experiencing numbness and weakness of the right upper  extremity.  She was unable to move very well, and for the most part, it  was becoming flappy whenever she raises her arm to fall back down.  Things have improved, however, today to where she was able to lift her  arm, but she was so worried and she came to the emergency room.  She had  some bursitis of her right shoulder many years ago for which she had  surgery, and she is worried that this may be related.  She has risk  factors for cerebrovascular accident including diabetes, hypertension,  dyslipidemia, anemia, coronary artery disease, and morbid obesity.  She  is currently denying any other weakness.  No other focal weakness as  well.   PAST MEDICAL HISTORY:  Significant for diabetes, hypertension,  dyslipidemia, morbid obesity, recent cellulitis, chronic lower extremity  wounds for which she has been on Unna boots secondary to lymphedema,  history of congestive heart failure, coronary artery disease status post  previous MI.  CHF deemed to be diastolic dysfunction.   ALLERGIES:  She is allergic to PENICILLIN.   CURRENT MEDICATIONS:  1. Lasix 40 mg daily.  2. Metformin 500 mg b.i.d.  3. Omeprazole 20 mg 2 tablets daily.  4. Oxybutynin 5 mg b.i.d.  5. Iron mg daily.  6. Ibuprofen 800 mg b.i.d.  7. Potassium 20 mEq  daily.  8. QC Arthritis Pain Relief 1 tablet 3 times a day.  9. Singulair 10 mg daily.  10.Simvastatin 20 mg nightly.  11.Zolpidem 10 mg nightly.  12.Gabapentin 300 mg nightly.  13.Hydrocodone and acetaminophen 7.5/750 one tablet b.i.d.  14.Ropinirole 1 mg 2 tablets nightly.  15.Benzonatate 100 mg p.r.n.   SOCIAL HISTORY:  The patient lives in [place] with her daughter.  She  is a former smoker.  Denied any alcohol or IV drug use.   FAMILY HISTORY:  Significant for coronary artery disease, diabetes, and  hypertension.   REVIEW OF SYSTEMS:  Fourteen-point review of systems negative except per  HPI.   PHYSICAL EXAMINATION:  VITAL SIGNS:  Temperature 98.1, blood pressure  141/53, pulse 69, respiratory rate 20, and sats 96% on room air.  GENERAL:  She is awake, alert, oriented, and in no acute distress.  HEENT:  PERRL.  EOMI.  NECK:  Supple.  No JVD.  No lymphadenopathy.  RESPIRATORY:  She has good air entry bilaterally.  No wheezes or rales.  CARDIOVASCULAR SYSTEM:  She has S1 and S2.  No murmur.  ABDOMEN:  Soft, nontender with positive bowel sounds.  EXTREMITIES:  She has no edema, cyanosis, or clubbing.  NEUROLOGIC:  Cranial nerves II through XII seems to be intact.  Power is  5/5 in both upper and lower extremities respectively.  No reduced power  seen on the right upper extremity.  There was slight pronator drift with  a right upper extremity.  Reflexes 2+ bilaterally in the upper and lower  extremities respectively.   LABORATORY DATA:  White count 7.0, hemoglobin 12.6, and platelet count  233.  Sodium is 138, potassium 4.6, chloride 106, CO2 of 23, glucose 80,  BUN 12, creatinine 0.86, calcium 8.5, and LFTs normal.  Head CT without  contrast showed no evidence of acute intracranial abnormalities.  Her  EKG showed sinus bradycardia with some AV block, rate of 58, PR interval  214.  No ST-T wave changes.   ASSESSMENT:  This is a morbidly obese female with history of  diabetes,  hypertension, dyslipidemia presenting with right upper extremity  weakness, which is now resolving.  This has been going on for 2 days.  Her CT so far negative for any acute; however, is to live within the 48-  hour window.  The patient also has risk factors for cerebrovascular  accident.  She came outside 3-hour window for acute t-PA.   PLAN:  1. CVA and TIA.  Based on the patient's presentation, we will admit      her to do an MRI.  Get PT/OT, although her symptoms seems to have      fully resolved at this point.  Mainly, it will be risk      modification.  I will check fasting lipid panel, homocysteine      level, hemoglobin A1c, B12, RPR, carotid Dopplers, and 2-D echo.      Once we get these test done depend on the results, we will take      major aim at risk stratification.  Meanwhile, I will put her on      aspirin for now.  2. Diabetes.  We will put her on sliding scale insulin.  Hold      metformin and use Lantus as needed.  Also, put her on diabetic      diet.  3. Hypertension.  We will continue with home regimen for blood      pressure control.  4. Dyslipidemia.  We will check fasting lipid panel and continue with      simvastatin.  5. Coronary artery disease.  The patient has no chest pain, no      evidence of any cardiac involvement at this point.  6. Congestive heart failure.  Again, the patient seems compensated      except for lower extremity edema, which is chronic.  Further      treatment will depend on how patient is or rather how her results      come out.     Lonia Blood, M.D.  Electronically Signed    LG/MEDQ  D:  03/15/2009  T:  03/16/2009  Job:  [id number]

## 2011-01-12 NOTE — H&P (Signed)
[name], [name]               ACCOUNT NO.:  [id number]   MEDICAL RECORD NO.:  [id number]          PATIENT TYPE:  INP   LOCATION:  A305                          FACILITY:  [facility]   PHYSICIAN:  Thomasenia Bottoms, MDDATE OF BIRTH:  31-Mar-1932   DATE OF ADMISSION:  03/17/2008  DATE OF DISCHARGE:  LH                              HISTORY & PHYSICAL   CHIEF COMPLAINT:  Fever and chills.   HISTORY OF PRESENTING ILLNESS:  [name] is a 77 year old woman who  actually called EMS twice today.  Her family initially called because  she had excruciating pain in her left lower leg.  The patient has  history of chronic ulcers in that leg and it was hurting her badly.  She  has pain medication at home, but it did not help.  EMS came and wrapped  her leg.  It felt a little better and she was going to follow up with  her doctors tomorrow.  However, later ton that evening she developed  severe shaking chills and high fever, so she called again and was  brought into the emergency department.  The patient has been dealing  with chronic ulcers in her lower extremities for years actually.  She  follows with a doctor for this, but the acute pain and acute fever today  were brand new.   PAST MEDICAL HISTORY:  Significant for:  1. Morbid obesity.  2. History of chronic ulcers in the lower extremities.  3. History of coronary artery disease.  4. CHF.  5. Diabetes.  6. Hyperlipidemia.  7. Hypertension.  8. History of MI.  9. History of an esophageal web which has been taken care of.  10.Possible C. diff in 2008.   SOCIAL HISTORY:  She quit smoking 7 years ago.  No alcohol or illicit  drug use.   FAMILY HISTORY:  Significant for a father who had an MI.   REVIEW OF SYSTEMS:  Constitutional:  She had fevers and sweats starting  today.  Prior to this her appetite was okay.  HEENT: She has never had a  headache ever.  She has some mild sinus symptoms periodically.  She does  take Singulair.   Currently she has no sore throat.  CARDIOVASCULAR:  She  has not had any chest pains.  She does have chronic edema.  RESPIRATORY:  She has a faint nonproductive cough, but no shortness of breath.  No  hemoptysis.  GI: No diarrhea or constipation.  No abdominal pain.  MUSCULOSKELETAL:  She had the right lower leg pain as mentioned above.  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  In 208 it was noted that she uses chronic O2, two liters at all  times.  PSYCHIATRIC:  She does have trouble with insomnia.  She sleeps  in a recliner mostly during the day and is up most of the night.  NEUROLOGIC:  She walks with a walker.  All other systems reviewed and  are negative.   In the emergency department her temperature was 102.9, blood pressure  146/74, pulse 119, respiratory rate 24, O2 saturation 99% on 2 liters  nasal cannula.   PHYSICAL EXAMINATION:  The patient is obese and she is pleasant and in  no acute distress.  HEENT:  Exam is normocephalic, atraumatic.  Pupils are equal round.  Sclerae nonicteric.  Oral mucosa moist.  NECK: Supple.  No lymphadenopathy, no thyromegaly, no jugular venous  distention.  CARDIAC:  Exam is regular rate and rhythm.  LUNGS:  Clear to auscultation bilaterally.  No wheezes, rhonchi or  rales.  CONSTITUTIONAL:  She feels hot to the touch all over her body.  ABDOMINAL EXAM:  Obese, nontender, nondistended.  She does have bowel  sounds.  No masses are appreciated.  EXTREMITIES: She has a large area in the right medial thigh.  It almost  looks like a shelf there.  It appears to be edematous tissue.  The  patient said that she was told she had a fatty tumor in that area and it  has been there for years.  NEUROLOGICALLY:  She is alert and oriented x3.  She is cooperative and  appropriate.  Her cranial nerves II-XII are intact grossly.  She has a  normal affect.  She has 5/5 strength in each of her extremities,  although it is  difficult for her to lift her leg completely off the bed  because of her thigh.  She can lift it  up part way, and she can go from  supine to sitting in the bed without any difficulty.  SKIN:  Both pf her right lower extremities are chronically large.  There  is no pitting edema on the right.  However, there is some mild pitting  edema in the top of the foot on the left.  The left lower extremity does  have some erythema from approximately the mid point in her foot all the  way up to almost her knee.  She has chronic hyperpigmentation of that  leg in the chronic lymphedema appearance, but the erythema is still  visible.  She has dry Calamine lotion all over her legs as well, but  still the erythema is visible.  I do not notice any frankly open lesions  on the leg, but there are several areas of indentation of the skin where  there could be leaking, which she does describe.  On the right lower  extremity, it is less swollen but there is a mild, what appears to be,  skin tear really, in the outer right lower calf.  It is about 2 cm  around and there is drainage however, low on the small bandage that she  has.  There is no erythema on that leg.   LABORATORY DATA:  Her white count is 13.7, hemoglobin 12.1, hematocrit  is 37.1, platelet count is 305.  Sodium is 140, potassium 4.6, chloride  108, bicarb 27, glucose 107, BUN 15, creatinine 1.16.  Chest x-ray was  limited because of the patient's size and low lung volumes.  There was  some crowding in the bases. Could not exclude infiltrates.   ASSESSMENT/PLAN:  1. Fever of 102.  This is likely secondary to the cellulitis, but we      certainly will check urine sample and blood cultures.  Her chest x-      ray was not revealing but she has some minimal upper respiratory      symptoms.  2. Cellulitis of the left lower extremity.  The patient is allergic to  penicillin so we will put her on Levaquin.  Certainly would      consider vancomycin  and a wound care consult for her diabetes,      hypertension, hypercholesterolemia.  We will continue her      outpatient medication.      Thomasenia Bottoms, MD  Electronically Signed     CVC/MEDQ  D:  03/17/2008  T:  03/17/2008  Job:  [id number]   cc:   Milus Mallick. Lodema Hong, M.D.  Fax: [phone]

## 2011-01-12 NOTE — H&P (Signed)
HISTORY AND PHYSICAL EXAMINATION   April 11, 2009   Re:  [name], [name]               DOB:  05-Jun-1932   DATE OF OFFICE VISIT:  April 16, 2009.   ADMISSION DIAGNOSIS:  Symptomatic left internal carotid artery stenosis.   HISTORY OF PRESENT ILLNESS:  The patient is a 77 year old black female  with an admission to [facility] in late July with a left brain  event causing right arm weakness and numbness.  She has had near total  resolution of this but does continue to have some stiffness in her right  shoulder.  She underwent an evaluation at that time to include a CT scan  showing no bleed.  She had an ultrasound suggesting left carotid  stenosis or occlusion.  She was seen by myself in the office on  03/21/2009 and at that I recommended a CT angiogram since I could not  determine left internal carotid artery occlusion vs subtotal occlusion  with severe stenosis.  She subsequently underwent outpatient CT  angiogram of her neck at [facility] on 03/25/2009 and this did  reveal high grade stenosis with a patent internal carotid artery on the  left above the bifurcation.  The patient had no prior neurologic  deficits up until this recent admission.   PAST MEDICAL HISTORY:  Is significant for morbid obesity.  She does have  hypertension, diabetes, a history congestive heart failure and elevated  cholesterol.  She had undergone cardiac catheterization in 2007 that  showed no evidence of any cardiac artery blockages, but did have  diminished left ventricular function.  She has not had any episodes of  cardiac failure since that time.   FAMILY HISTORY:  Is negative for any premature atherosclerotic disease.   SOCIAL HISTORY:  She is single.  She does continue to smoke cigarettes  despite her pulmonary difficulty.  She does not drink alcohol.   REVIEW OF SYSTEMS:  Positive for shortness breath with exertion and  lying flat, esophageal reflux,  dizziness and arthritic joint pain.   ALLERGIES:  Penicillin.   MEDICATIONS:  1. Lasix 40 mg daily.  2. Metformin 500 mg b.i.d.  3. Omeprazole 20 mg 2 tablets daily.  4. Oxybutynin 5 mg p.o. b.i.d.  5. Potassium chloride 20 mEq daily.  6. Singulair 10 mg daily.  7. Simvastatin 20 mg nightly.  8. Zolpidem 10 mg nightly.  9. Gabapentin 300 mg nightly.  10.Hydrocodone/acetaminophen p.r.n. pain.  11.Ropinirole 1 mg 2 tablets nightly.  12.Benzonatate 10 mg p.r.n.   PHYSICAL EXAM:  A morbidly obese black female sitting in a wheelchair in  no acute distress.  Neurologically she does have some limitation in her  shoulder rotation, but otherwise is grossly intact from a neurologic  standpoint.  Carotid arteries are without bruits bilaterally.  She has  2+ radial pulses bilaterally.  Heart:  Has a regular rate and rhythm  without murmur.  Chest:  Is clear bilaterally.   Additional CT study from July 27th revealed critical left internal  carotid artery stenosis with a patent internal carotid artery more  distally.   IMPRESSION:  Severe symptomatic left internal carotid artery stenosis.   PLAN:  The patient will be admitted for carotid endarterectomy on 08/24.  Discussed this with the patient and her family present.  I explained the  1% to 2% risk of stroke with surgery and the risk for cranial nerve  injury, also with  her cardiac and pulmonary dysfunction explained the  slight chance of worsening of these, but feel that with critical high-  grade symptomatic stenosis there is no alternative.  I did discuss  stenting but have recommended against this due to her age and calcified  lesion.  She is for surgery on 04/22/2009.   Larina Earthly, M.D.  Electronically Signed   TFE/MEDQ  D:  04/11/2009  T:  04/11/2009  Job:  [id number]

## 2011-01-12 NOTE — Group Therapy Note (Signed)
[name], [name]               ACCOUNT NO.:  [id number]   MEDICAL RECORD NO.:  [id number]          PATIENT TYPE:  INP   LOCATION:  A305                          FACILITY:  [facility]   PHYSICIAN:  Dorris Singh, DO    DATE OF BIRTH:  [birth year]   DATE OF PROCEDURE:  DATE OF DISCHARGE:                                 PROGRESS NOTE   The patient seen today resting comfortably in chair.  She had a PICC  line placed.  Has had no complications, actually is liking the PICC line  a little bit better.  Discussed with her this concern of this vaginal  discharge which is malodorous.  The patient states a history of having  this before and what she has been treated by Dr. Emelda Fear in the past.  I told her that I would consult Dr. Emelda Fear to come see her since she  stated that the color his changed and it as much more malodorous than it  has been.   Her vitals for today, temperature 98.4.  Pulse 83.  Respirations 16.  Blood pressure 103/60.  GENERAL:  The patient is a 77 year old African American female.  She is  obese but she is well-developed, well-nourished in no acute distress.  HEART:  Rate is regular rate and rhythm.  LUNGS:  Clear to auscultation bilaterally.  ABDOMEN:  Soft, nontender, nondistended, pendulous.  EXTREMITIES:  Positive venous stasis bilaterally with discolorization  and peeling of both lower legs bilaterally.  On the left leg, there is a  wound that is healing on the underside of her calf.  Also there is  redness and swelling on both legs bilaterally.   Her labs for today, there are not any.  We will go ahead and order labs  for tomorrow.   ASSESSMENT/PLAN:  1. Cellulitis:  The patient started on vancomycin yesterday and also      PICC line was placed, will continue this.  Plan on continuing      vancomycin at home.  We will set up home health care to do so.  2. Vaginal discharge:  We will go ahead and consult Dr. Emelda Fear, I      have talked to him on the phone.   He will be here to see her today.      Dorris Singh, DO  Electronically Signed     CB/MEDQ  D:  03/21/2008  T:  03/21/2008  Job:  [id number]

## 2011-01-12 NOTE — Discharge Summary (Signed)
[name], [name]               ACCOUNT NO.:  [id number]   MEDICAL RECORD NO.:  [id number]          PATIENT TYPE:  INP   LOCATION:  2039                         FACILITY:  [facility]   PHYSICIAN:  Larina Earthly, M.D.    DATE OF BIRTH:  31-Jan-1932   DATE OF ADMISSION:  04/17/2009  DATE OF DISCHARGE:  04/19/2009                               DISCHARGE SUMMARY   ADMISSION DIAGNOSIS:  Symptomatic left internal carotid artery stenosis  with recurrent left brain stroke.   FINAL DISCHARGE DIAGNOSES:  1. Symptomatic left internal carotid artery stenosis with recurrent      left brain stroke, now found to have left internal carotid artery      occlusion (has 20-39% right internal carotid artery stenosis by      duplex in July 2010).  2. Mild residual expressive aphasia secondary to stroke.  3. Chronic incontinence with (started on Cipro x5 days with urinalysis      and urine culture pending at the time of this dictation).  4. Morbid obesity.  5. Diabetes mellitus type 2.  6. Dyslipidemia.  7. Tobacco abuse, recently quit 3 months ago.  8. History of congestive heart failure with diastolic dysfunction.  9. History of prior myocardial infarction.  10.Allergy to PENICILLIN.   CONSULTS:  Neurology.   BRIEF HISTORY:  [name] is a 77 year old black female with admission  to [facility] in late July of this year with left brain event  causing right arm weakness and numbness.  She has had near-total  resolution of her symptoms but has had some continued stiffness in her  right shoulder.  She had underwent a CT scan showing no evidence of  bleed.  She underwent ultrasound suggesting left carotid stenosis or  occlusion.  She was referred to Dr. Arbie Cookey who saw her on March 21, 2009.  He recommended a CTA to determine if she had a left ICA occlusion or  subtotal occlusion with severe stenosis.  This was done on March 25, 2009, and revealed high-grade stenosis with a patent ICA on the left  above the bifurcation.  Initially, he recommended she be admitted for  left carotid endarterectomy on April 22, 2009.  However, on April 14, 2009, she was seen again in [facility] with alteration of  speech and was discharged at the [facility].  The family reported that her  garbled speech has not really cleared.  They contacted Dr. Arbie Cookey who  recommended moving her carotid endarterectomy up; however, he felt she  should first undergo further evaluation to ensure that she had not had  progression to left ICA occlusion.   HOSPITAL COURSE:  [name] was admitted to [facility] on  April 17, 2009.  She had significant expressive aphasia and her family  felt it had progressed since her recurrent event the previous Monday.  Family also now reported she had some right-sided weakness.  Based on  these symptoms, her surgery was cancelled and as mentioned, he  recommended she get an MRI/MRA if her size would allow.  We also  consulted  Neurology and she was seen by Dr. Sharene Skeans.  PT/OT and speech  therapy were ordered as well.  Dr. Sharene Skeans recommended repeating a CT  scan and MRI.  Ultimately, she, because she weighed 380 pounds, was not  able to fit in the inpatient MRI scan.  She had the CT scan of the head  without contrast on April 17, 2009, which showed newly seen low density  in the left parietal cortex consistent with subacute infarction, old  infarction in the left frontoparietal white matter were noted towards  the vertex.  She did undergo carotid Dopplers who showed no significant  ICA stenosis on the right and possibly minimal flow in the left ICA.  Vertebral flow was antegrade.  She ultimately had a CTA of the neck,  which showed new complete occlusion of the left internal carotid artery.  Based on this finding, left carotid endarterectomy was no longer  warranted.  It was ultimately felt that since her symptoms were overall  improving, she could be discharged with home  health therapies.  She  still had some mild expressive aphasia.  No drift or focal weakness was  noted by the neurologist on April 19, 2009, and ultimately is felt she  could be discharged home.  At the time of dictation, she still has a  Foley catheter which has been discontinued.  She has had a history of  incontinence with some urinary symptoms; therefore, Dr. Arbie Cookey did start  her on a 5-day course of ciprofloxacin.  Prior to starting that, the  nurse is to collect a urinalysis and sent for cultures and sensitivities  which can be followed with results can be followed at our office.  Neurologist, Dr. Pearlean Brownie is recommending that she stay on Plavix for 2  months then may resume aspirin alone.   DISPOSITION:  [name] is felt to be in stable and improving condition  and ready for discharge on April 19, 2009, if able to void after  removal of Foley catheter.  She will continue home health therapies.   DISCHARGE MEDICATIONS:  1. Furosemide 40 mg p.o. b.i.d.  2. Hydrocodone 7.5/750 mg 1 tablet p.o. b.i.d.  3. Metformin 500 mg p.o. b.i.d., she is to resume this on April 21, 2009.  4. Omeprazole 20 mg 2 tablets daily.  5. Neurontin 300 mg p.o. nightly.  6. Ditropan 5 mg p.o. b.i.d.  7. ReQuip 2 mg p.o. nightly.  8. Simvastatin 20 mg p.o. nightly.  9. Ambien 10 mg p.o. nightly.  10.Plavix 75 mg p.o. daily x2 months.  11.Coated aspirin 81 mg p.o. daily x2 months, then resume 325 mg p.o.      daily.  12.Cipro 500 mg p.o. b.i.d. x5 days.  13.Silvadene cream to lower extremities daily.  This was started by      Dr. Arbie Cookey due to venous stasis changes.   DISCHARGE INSTRUCTIONS:  To increase her activities as tolerated.  [facility] will provide home health physical therapy,  occupational therapy, nursing care, and speech therapy.  She continue a  diabetic appropriate diet.  She will see Dr. Arbie Cookey in approximately 6  months with carotid ultrasound of the right, and she is  instructed to  call Dr. Pearlean Brownie for neurology followup.  She should call sooner if any  recurrent neurological symptoms.   LABORATORY DATA:  Most recent labs show a hemoglobin A1c of 6.4.  Total  cholesterol 112, triglyceride 48, HDL 47, LDL 55, VLDL of  10, total  cholesterol with HDL ratio of 2.4.  Sodium 139, potassium 3.9, glucose  102, BUN of 10, creatinine 0.9, total bilirubin of 0.6, alkaline  phosphatase 92, AST 18, ALT 14, total protein 7.1, blood albumin 3.0,  calcium 9.3.  White count is 9.1, hemoglobin 13.3, hematocrit 40.8, and  platelet count of 313.      Jerold Coombe, P.A.      Larina Earthly, M.D.  Electronically Signed    AWZ/MEDQ  D:  04/19/2009  T:  04/19/2009  Job:  [id number]   cc:   Pramod P. Pearlean Brownie, MD  Milus Mallick Lodema Hong, M.D.  Lonia Blood, M.D.

## 2011-01-12 NOTE — Discharge Summary (Signed)
[name], [name]               ACCOUNT NO.:  [id number]   MEDICAL RECORD NO.:  [id number]          PATIENT TYPE:  INP   LOCATION:  A326                           FACILITY:   PHYSICIAN:  Dorris Singh, DO    DATE OF BIRTH:  02-09-1932   DATE OF ADMISSION:  12/30/2008  DATE OF DISCHARGE:  05/07/2010LH                               DISCHARGE SUMMARY   ADMISSION DIAGNOSES:  1. Cellulitis involving left lower extremity and upper thigh area.  2. Chronic lower extremity wounds in [place] boots.  3. History of congestive heart failure.  4. Morbid obesity.  5. Type 2 diabetes.  6. History of hypertension.   DISCHARGE DIAGNOSES:  1. Bacterial vaginosis with possible Trichomonas.  2. Cellulitis involving left lower extremity and upper thigh area,      resolved.  3. Lymphedema of left upper thigh area.  4. History of congestive heart failure.  5. Morbid obesity.  6. History of type 2 diabetes.  7. History of hypertension.   CONSULTS THAT WERE MADE:  Dr. Emelda Fear.   PRIMARY CARE PHYSICIAN:  Dr. Lodema Hong.   TESTING THAT WAS DONE:  She had a two-view chest x-ray which showed no  acute cardiopulmonary process.   HOSPITAL COURSE:  The patient was admitted with the above diagnoses.  She was placed on vancomycin.  Wound care was also consulted to see her.  Her wounds were cultured and grew out group B strep.  She was kept on  the vancomycin while she was here.  Also, there were some complaints and  a malodorous discharge coming from her vagina.  Dr. Emelda Fear was then  consulted.  She was found to have bacterial vaginosis and there was some  suspicion for Trichomonas.  She was treated appropriately with Flagyl 2  grams at once and then she was on Flagyl prior to him seeing her as well  as Cipro.   PLAN FOR DISCHARGE:  We will put her on her home medications which  include:  1. Lasix 40 mg p.o. daily.  2. Hydrocodone 7.5 as needed.  3. Metformin 500 mg 2 times a day.  4. Omeprazole 20  mg 2 times a day.  5. Oxybutynin 5 mcg 2 times a day.  6. Potassium 20 mEq daily.  7. Iron tablets 150 mg daily.  8. Singulair 10 mg daily.  9. Requip 1 mg at bedtime.  10.She will start on:      a.     Levaquin 500 mg 1 p.o. daily.      b.     Flagyl 500 mg 1 p.o. t.i.d. times the next 5 days.   It is recommended for her to follow-up with Dr. Lodema Hong next week to set  up an appointment for her.  She is to increase her activity slowly.  Her  wound care instructions include wraps and donut for upper left thigh and  she is to continue the regimen.  If she has any problems she is to  report to her primary care physician.   CONDITION:  Stable.   DISPOSITION:  To home.  She will  have Home Health care as well.      Dorris Singh, DO  Electronically Signed     CB/MEDQ  D:  01/03/2009  T:  01/03/2009  Job:  [id number]

## 2011-01-12 NOTE — Assessment & Plan Note (Signed)
[facility]   NAME:  [name], [name]               ACCOUNT NO.:  [id number]   MEDICAL RECORD NO.:  [id number]           DATE OF BIRTH:   PHYSICIAN:  Theresia Majors. Tanda Rockers, M.D. VISIT DATE:  02/13/2007                                   OFFICE VISIT   SUBJECTIVE:  [name] is a 77 year old lady, referred by Dr. Syliva Overman of [place], for evaluation of a ulceration on the right lower  extremity.   IMPRESSION:  Posttraumatic stasis ulcer.   RECOMMENDATIONS:  Unna boot compression protocol, initially for 10 days  with followup at the [facility] for removal of the  compression wrap and continuation of compression therapy until the wound  is resolved.   SUBJECTIVE:  [name] is a 77 year old, retired Engineer, civil (consulting), who had a blunt  injury to the right anterolateral leg 10 days ago.  She was initially  seen in [place] by the home health nurse and had a mild compression  wrap placed.  She was subsequently seen by Dr. Lodema Hong and referred to  the wound center for evaluation.   PAST MEDICAL HISTORY:  Remarkable for an esophageal stricture, treated  with sequential dilatations.  She has also had multiple myocardial  infarcts over the last year and has complained of chronic recurrent  urinary tract infections associated with polyuria and urgency.   SHE IS ALLERGIC TO PENICILLIN, WHICH GIVES HER HIVES.   CURRENT MEDICATIONS:  Includes:  1. Potassium 20 mEq daily.  2. Lasix 40 mg daily.  3. Omeprazole 20 mg b.i.d.  4. Metformin 5 mg daily.  5. Lunesta 3 mg daily.  6. Phenergan 12.5 mg b.i.d.  7. Requip 1 mg b.i.d.  8. Simvastatin 20 mg daily.  9. Polyiron 150 mg daily.   FAMILY HISTORY:  Positive for hypertension, diabetes and stroke.  It is  negative for cancer.   SOCIAL HISTORY:  She is not married.  She has 3 children who live in the  local area.  She is retired.   REVIEW OF SYSTEMS:  She specifically denies visual changes, transient  paralysis or speech impediments.  She has severe degenerative arthritis  and uses a walker.  She is a reformed smoker.  She denies shortness of  breath or hemoptysis.  She has lost approximately 90 pounds over the  last year related to dietary changes attendant with her esophageal  stricture.  Patient has been endoscoped locally and at [facility] for the esophageal stricture.  She denied angina pectoris.  The  remainder of the review of systems is negative.   PAST SURGICAL HISTORY:  Has included an umbilical hernia repair.   PHYSICAL EXAMINATION:  GENERAL:  She is a robust female in no acute  distress.  VITAL SIGNS:  Blood pressure is 125/77, respirations 20, pulse rate 84,  temperature 98.2.  Capillary blood glucose 120 mg%.  She is accompanied  by her daughter.  HEENT EXAM:  Clear.  NECK:  Supple.  Trachea is midline.  Carotid bruits are not appreciated.  LUNGS:  Clear.  HEART:  Sounds are distant.  ABDOMEN:  Soft.  EXTREMITIES:  Femoral pulses are not appreciated due to her adiposity.  The pedal pulses are  readily palpable.  There is bilateral 2-3+ edema,  which required milking from the feet in order to palpate the dorsalis  pedis pulses.  Capillary refill is normal.  Neurologically, protective  sensation is preserved.  On the right anterolateral lower extremity,  there is a linear laceration, which is well approximated.  There is some  serous drainage, which is blotable.  There is no evidence of infection.  There is no evidence of associated ischemia.  There are chronic changes  of stasis including receding edema to 2+ at the mid calf and  infrapatellar areas.  Hyperpigmentation is present and areas of scar  from previous ulcerations are evident also.   DISCUSSION:  This 77 year old diabetic has a recent traumatic injury,  which has converted to a stasis ulcer.  We have recommended that we  place her in external compression for a total of 10 days.  We anticipate   that this wound is likely to be completely resolved in 10 days.  There  is no evidence of infection or vascular compromise.  Due to the  inconveniences of travel and also the expense, we have recommended that  the patient be seen in followup at the Rehab Department at [facility] for removal of the wrap and if the wound is persistent, we  would be happy to see her in followup or we could arrange for a home  health nurse to reevaluate and place compression wrap.  This is a  relatively minor stasis wound with no evidence of infection and should  respond as it has been recognized early.   We have explained this approach to the daughter and the patient in terms  that they both seem to understand.  They express gratitude for having  been seen in the clinic and indicate that they will be compliant as  outlined above.      Harold A. Tanda Rockers, M.D.  Electronically Signed     HAN/MEDQ  D:  02/13/2007  T:  02/13/2007  Job:  [id number]   cc:   Milus Mallick. Lodema Hong, M.D.

## 2011-01-12 NOTE — Group Therapy Note (Signed)
NAMEELIZAVETA, MATTICE               ACCOUNT NO.:  [id number]   MEDICAL RECORD NO.:  [id number]          PATIENT TYPE:  INP   LOCATION:  A326                          FACILITY:  [facility]   PHYSICIAN:  Dorris Singh, DO    DATE OF BIRTH:  1932-05-05   DATE OF PROCEDURE:  01/01/2009  DATE OF DISCHARGE:                                 PROGRESS NOTE   The patient is seen today up in the bed and looks clinically improved.  Dr. Emelda Fear she stated did not come back and do an exam.  However, she  was started on Flagyl and she does have a history of chronic bacterial  vaginitis.  So, she states she has noticed an improvement and also her  leg is feeling better.   Temperature 97.9, pulse 73, respirations 16, blood pressure 108/57.  GENERAL:  She is well-developed, obese, in no acute distress.  LUNGS:  Clear auscultation bilaterally.  ABDOMEN:  Soft, nontender and  nondistended.  EXTREMITIES:  Positive pulses.   LABORATORY DATA:  Her labs are as follows:  White count 15.5 which is  decreased, hemoglobin 10.7, hematocrit 32.0, and platelet count of 238.  Her  BMP is within normal limits.   ASSESSMENT/PLAN:  1. Cellulitis involving left lower extremity at the thigh area.  This      seems to be improving.  She did test positive for Streptococcus      group B.  Vancomycin will cover this and will continue with that.  2. Vaginal discharge.  She is on Flagyl as well.  Will continue with      that.   I suspect as long as the patient continues to do well, we may be able to  discharge her back to home in the next 2-3 days.      Dorris Singh, DO  Electronically Signed     CB/MEDQ  D:  01/01/2009  T:  01/01/2009  Job:  [id number]

## 2011-01-12 NOTE — Consult Note (Signed)
[name], [name]               ACCOUNT NO.:  [id number]   MEDICAL RECORD NO.:  [id number]          PATIENT TYPE:  INP   LOCATION:  2039                         FACILITY:  [facility]   PHYSICIAN:  Deanna Artis. Hickling, M.D.DATE OF BIRTH:  02-27-1932   DATE OF CONSULTATION:  04/17/2009  DATE OF DISCHARGE:                                 CONSULTATION   CHIEF COMPLAINT:  Recurrent left brain stroke with left carotid artery  stenosis.   HISTORY OF PRESENT ILLNESS:  This 77 year old morbidly obese woman with  probable left brain stroke with right upper extremity paresis admitted  to [facility] from July 17 to March 18, 2009 was noted to have  left carotid artery stenosis on duplex that could not exclude occlusion.  CT angiogram on March 25, 2009 showed high-grade stenosis to 1.1 mm in  the proximal carotid with a fairly high bifurcation with distal  narrowing of the carotid suggesting fairly severe stenosis.   The patient presented to [name] on April 14, 2009 with alteration  of speech.  She was seen by Dr. Bruce Donath and was discharged as a TIA.  Family stated that she had garbled speech and it did not clear.  Dr.  Freida Busman thought that it did clear, contacted both Neurology and Dr. Gretta Began.  The date was moved out for her carotid endarterectomy to today.   When the patient presented today, she had a severe expressive aphasia.  Surgery was cancelled.  I was asked to assess the patient for  progression of left brain ischemia.  Family states that she has been  stable over the past 3 days.  CT scan of the brain on April 14, 2009  showed a remote left centrum semiovale infarction.  I thought there was  increased signal at the trifurcation on the left side, however, this  could just be calcification.   Risk factors for stroke include type 2 diabetes mellitus, dyslipidemia,  and morbid obesity.  The patient recently quit smoking, 1-pack per week  (3 months ago).  She has a prior  myocardial infarction and congestive  heart failure with diastolic dysfunction.   Comorbid conditions include a large mass in her inguinal region which I  think may be a hernia.  She is said to have also lymphedema, chronic  venous insufficiency, bursitis, gastroesophageal reflux disease, and  osteoarthritis.   CURRENT MEDICATIONS:  1. Lasix 40 mg daily.  2. Metformin 500 mg twice daily.  3. Omeprazole 20 mg 2 daily.  4. Oxybutynin 5 mg twice daily.  5. Ferrous sulfate daily.  6. Ibuprofen 800 mg twice daily.  7. QC Arthritis one 3 times daily.  8. Singulair 10 mg daily.  9. Simvastatin 20 mg at bedtime.  10.Zolpidem 10 mg at bedtime.  11.Gabapentin 300 mg at bedtime.  12.Hydrocodone/acetaminophen 7.5/750 one twice daily.  13.Ropinirole 1 mg two at bedtime.  14.Benzonatate 100 mg as needed.   DRUG ALLERGIES:  PENICILLIN (rash).   FAMILY HISTORY:  Positive for coronary artery disease, diabetes  mellitus, and hypertension.   REVIEW OF SYSTEMS:  Negative except as  noted above in history of present  illness.   SOCIAL HISTORY:  The patient lives in [place].  She is disabled.  She  has not used alcohol, drugs, and quit smoking 3 months ago.   PHYSICAL EXAMINATION:  GENERAL:  Morbidly obese woman.  VITAL SIGNS:  Temperature 98.1, pulse 70, respirations 20, and blood  pressure 150/90.  EAR, NOSE, AND THROAT:  No infections.  NECK:  No bruits.  LUNGS:  Clear.  HEART:  No murmurs.  PULSES:  Normal.  ABDOMEN:  Protuberant.  I can feel no masses.  Bowel sounds are normal.  EXTREMITIES:  Swollen, discolored, lymphedema.  There is a large mass in  her left groin extending down almost 1/2 week to her knee.  NEUROLOGIC:  Severe expressive dysphasia.  She has paraphasias and  neologisms and is not fluent.  She cannot repeat well.  She follows  commands.  Visual fields, she blinks much more on the left than the  right.  She can pick up fingers that are wiggling in the right upper  and  lower visual fields.  Symmetric facial strength.  Midline tongue.  When  she speaks, her speech is fairly distinct even though she mispronounces  words.  Motor examination:  No drift, except slightly on the right arm.  She has very well preserved strength in the upper extremities and mild  proximal strength in lower extremities.  She has poor effort of the  knees, I think partially because of pain.  She has good strength in her  foot dorsiflexors and plantar flexors.  She wiggles her fingers and can  oppose her fingers fairly well, although she does a bit better in the  left hand than the right.  Sensation is equal to pinprick.  Because of  her language, I cannot test stereognosis.  She is areflexic.  She had  bilateral flexor plantar responses.   IMPRESSION:  The patient has dysfunction in her insular region on the  left and also the left posterior temporal region with partial extinction  to double simultaneous stimuli.  I suspect extension of her stroke.   PLAN:  Repeat CT scan.  Because she is 380 pounds, we cannot do an MRI  scan except over a DRI.  If the CT shows the infarction, there is no  reason to push on with the MRI.  If it does not, we need to do so.  I  would hold off on the carotid endarterectomy for less than 2 and no more  than 4 weeks.  There is no reason to repeat a CT angiogram until then.  Her hemoglobin A1c was 6.2 and LDL was 57 in July when she was admitted.  We will recheck these and I expect they will fine.  We need to check a 2-  D echocardiogram.  I question whether a Doppler will be helpful given  the situation.  She also needs a transcranial Doppler.  She should be  placed on aspirin 325 mg a day or Plavix 75 mg a day.  This is a  difficult situation because we cannot revascularize her and she is at  great risk for recurrence.  NIH stroke scale at 10:30 a.m. was 7.  On  her modified [place], she had to walk with a walker and  so was no better than 3.   She will be kept n.p.o. until she has a stroke  swallow screen.  She is already on statin drugs.  We will start  antiplatelet and my recommendation would be Plavix because she is  already on so many nonsteroidals.  She will need DVT prophylaxis.      Deanna Artis. Sharene Skeans, M.D.  Electronically Signed     WHH/MEDQ  D:  04/17/2009  T:  04/18/2009  Job:  [id number]

## 2011-01-12 NOTE — Discharge Summary (Signed)
NAMEYOKO, MCGAHEE               ACCOUNT NO.:  [id number]   MEDICAL RECORD NO.:  [id number]          PATIENT TYPE:  INP   LOCATION:  A306                          FACILITY:  [facility]   PHYSICIAN:  Lonia Blood, M.D.      DATE OF BIRTH:  [birth year]   DATE OF ADMISSION:  03/15/2009  DATE OF DISCHARGE:  LH                               DISCHARGE SUMMARY   PRIMARY CARE PHYSICIAN:  Milus Mallick. Lodema Hong, MD   DISCHARGE DIAGNOSES:  1. Right upper extremity cerebrovascular accident.  2. Morbid obesity.  3. Diabetes.  4. Hypertension.  5. Dyslipidemia.  6. Coronary artery disease.  7. Congestive heart failure.  8. Left internal carotid artery stenosis.   DISCHARGE MEDICATIONS:  1. Lasix 40 mg daily.  2. Metformin 500 mg p.o. b.i.d.  3. Omeprazole 20 mg 2 tablets daily.  4. Oxybutynin 5 mg b.i.d.  5. Iron 1 tablet daily.  6. Ibuprofen 800 mg b.i.d.  7. Potassium 20 mEq daily.  8. QC Arthritis Pain Relief 1 tablet 3 times daily.  9. Singulair 10 mg daily.  10.Simvastatin 20 mg nightly.  11.Zolpidem 10 mg nightly.  12.Gabapentin 300 mg nightly.  13.Hydrocodone and acetaminophen 7.5/750 one tablet b.i.d.  14.Ropinirole 1 mg 2 tablets nightly.  15.Benzonatate 100 mg p.r.n.   Of note, the patient also has right shoulder tear of glenohumeral joint  with full tears of supraspinatus and likely the infraspinatus tendons as  well.  There was displaced os acromiale, multilevel considerable right  foraminal stenosis due to uncinate spurring.   CONSULTATIONS:  Dr. Hilda Lias, who will see the patient prior to her  leaving.  Also, we have consulted Dr. Arbie Cookey, vascular surgeon.  The  patient is to follow with Dr. Arbie Cookey on Friday, July 23, at 10 o'clock in  the morning at the office.   Procedures performed this admission:  1. Head CT without contrast on July 17 showed no evidence of acute      intracranial abnormality.  Carotid duplex ultrasound that showed      significant plaque at proximal left  ICA with 70% or more near      occlusion stenosis of the proximal left ICA.  The right vertebral      artery showed no flow.  2. CT of right upper extremity around the shoulder showed severe      degenerative glenohumeral arthropathy with evidence of chronic full-      thickness, full-width tears of the supraspinatus and likely the      infraspinatus tendons.  There was displaced os acromiale and also      multilevel considerable right foraminal stenosis due to uncinate      spurring.  A follow head CT on July 19 showed no definite acute or      subacute insult, chronic appearing small vessel changes in the      hemispheric white matter which are numerous in the left parietal      region which could be related to right arm weakness.   BRIEF HISTORY AND PHYSICAL:  Please refer to dictated history and  physical by me on July 17.  In short, however, this is a 77 year old  [place] female that came in with right upper extremity  weakness.  She has numerous risk factors for CVA and was subsequently  admitted with possible CVA.   HOSPITAL COURSE:  1. CVA.  The patient had workup including echocardiogram, CT scan, and      her homocystine level which was 12.5 being normal.  Her symptoms      have improved; however, based on her bilateral carotid Doppler      ultrasound as well as a repeat CT scan 48 hours later indicated      that she probably had a small parietal infarct.  At this point, her      symptoms have improved.  She is to continue on aspirin.  Her CVA      and right internal carotid artery stenosis will be addressed by      vascular surgery.  2. Tear of the supraspinatus tendon, this is chronic it appears.      However, at this point, the patient is to follow up with Dr.      Hilda Lias who will see the patient prior to discharge.  3. Diabetes.  The patient's blood sugars have been effectively      controlled also.  She will continue with her home therapy.  4. Hypertension.   Blood pressure is currently controlled on her home      therapy.  5. Dyslipidemia.  We will continue with her simvastatin while in here.  6. Coronary artery disease.  We cycled the patient's enzyme and seemed      to be stable.  Other than that, the patient is stable for discharge      as soon as she is seen by Dr. Hilda Lias and cleared for discharge.       Lonia Blood, M.D.  Electronically Signed     LG/MEDQ  D:  03/18/2009  T:  03/19/2009  Job:  [id number]

## 2011-01-12 NOTE — Group Therapy Note (Signed)
[name], [name]               ACCOUNT NO.:  [id number]   MEDICAL RECORD NO.:  [id number]          PATIENT TYPE:  INP   LOCATION:  A326                          FACILITY:  [facility]   PHYSICIAN:  Skeet Latch, DO    DATE OF BIRTH:  [birth year]   DATE OF PROCEDURE:  01/02/2009  DATE OF DISCHARGE:                                 PROGRESS NOTE   SUBJECTIVE:  [name] states that her legs are feeling better.  The  patient states that she has been getting up and sitting on the side of  the bed, and walking to the door with help with physical therapy.  Overall, she seems to be improving.   OBJECTIVE:  VITAL SIGNS:  Temperature 98.5, pulse 73, respirations 22,  blood pressure 115/59.  She is satting 100% on room air.  GENERAL:  She is awake, alert, obese in no acute distress.  CARDIOVASCULAR:  Regular rate and rhythm.  No murmurs, rubs or gallops.  LUNGS:  Clear to auscultation bilaterally.  No wheezes, rales or  rhonchi.  ABDOMEN:  Obese, soft, nontender and nondistended.  EXTREMITIES:  She has good pulses.  Bilateral legs are wrapped at this  time.  Unable to get full visualization of the legs.   LABORATORY DATA:  Pending at this time.   ASSESSMENT:  Cellulitis of the left lower extremity and thigh area,  seems to be improving.   PLAN:  The patient was positive for strep B.  The patient continues on  vancomycin.  The patient may need a PICC line.  She is going to be sent  home on IV antibiotics.  We will await further blood work to see if  patient still has an elevated white blood cell count.  At the time of  discharge, the patient is placed on Flagyl and will continue with that  medication at this time.  If the patient continues to improve,  anticipate her being discharged in the next 24-48 hours.  Pending her  lab work, the patient may need home IV antibiotics.      Skeet Latch, DO  Electronically Signed     SM/MEDQ  D:  01/02/2009  T:  01/02/2009  Job:  [id number]

## 2011-01-12 NOTE — Procedures (Signed)
CAROTID DUPLEX EXAM   INDICATION:  Followup evaluation of known carotid artery disease.   HISTORY:  Diabetes:  Yes.  Cardiac:  Coronary artery disease, congestive heart failure, myocardial  infarction.  Hypertension:  Smoking:  Yes, former smoker.  Previous Surgery:  No.  CV History:  Previous duplex on 03/17/2009 revealed no right ICA  stenosis and >70% left ICA stenosis and occluded right vertebral artery.  The patient had an episode of right arm paresthesia.  Amaurosis Fugax No, Paresthesias Yes, Hemiparesis Yes                                       RIGHT             LEFT  Brachial systolic pressure:         160               166  Brachial Doppler waveforms:         Triphasic         Triphasic  Vertebral direction of flow:        Inaudible         Antegrade  DUPLEX VELOCITIES (cm/sec)  CCA peak systolic                   104               73  ECA peak systolic                   111               119  ICA peak systolic                   67                Occlusion  ICA end diastolic                   17                Occlusion  PLAQUE MORPHOLOGY:                  Soft              Mixed  PLAQUE AMOUNT:                      Mild              Severe  PLAQUE LOCATION:                    Proximal ICA      Throughout ICA   IMPRESSION:  1. 20-39% right ICA stenosis.  2. Cannot rule out occluded left ICA versus trickle flow or collateral      flow.  3. Inaudible right vertebral artery suggests occlusion.   ___________________________________________  Larina Earthly, M.D.   MC/MEDQ  D:  03/21/2009  T:  03/21/2009  Job:  [id number]

## 2011-01-12 NOTE — Consult Note (Signed)
NEW PATIENT CONSULTATION   [name], [name]  DOB:  [birth year]                                       03/21/2009  ZOXWR#:[id number]   The patient presents today for evaluation for recent left brain event.  She is a very pleasant, morbidly obese 77 year old black female who was  admitted to [facility] on 07/17 with right upper extremity  weakness.  The patient is convinced that this was related to bursitis  although her daughters present said she had minimal control of her right  arm.  Fortunately this has recovered to a great extent and is almost  back to her baseline.  She reports that she had had injections in the  past for bursitis as well up to 10 years ago.   PAST MEDICAL HISTORY:  Significant for hypertension, diabetes, history  of congestive heart failure and elevated cholesterol.   FAMILY HISTORY:  She denies a positive family history for peripheral  vascular or cardiac disease.   SOCIAL HISTORY:  She is single.  She does continue to smoke cigarettes,  does not drink alcohol.   REVIEW OF SYSTEMS:  She does have shortness of breath with exertion and  lying flat, esophageal reflux, dizziness, arthritic joint pain.   ALLERGY:  To penicillin.   CURRENT MEDICATIONS:  Include new initiation of daily aspirin and  multiple meds that are attached to her chart.   PHYSICAL EXAMINATION:  A morbidly obese black female sitting in a  motorized scooter in no acute distress.  Her radial pulses are 2+  bilaterally.  She is grossly intact neurologically.  Carotid arteries  are without bruits bilaterally.   She underwent contributory carotid duplex in our office.  She appears to  have occlusion of her left internal carotid artery.  She has no  significant stenosis in her right carotid system.  We cannot rule out a  trickle of flow.  She does appear to have occluded right vertebral  artery.  I discussed the significance of this with the patient and her  daughters present.  I explained that if she has an occluded internal  carotid artery on the left that there is no treatment other than  aspirin.  I also explained that if she has a trickle of flow with a  patent internal carotid artery above the stenosis that this would be an  indication for endarterectomy for reduction of stroke risk.  She is  scheduled for an outpatient CT angiogram of her carotids to determine  occlusion versus severe stenosis of her internal carotid arteries.  We  will contact her following this and if she does have occlusion she will  continue to have medical treatment, if she does have a severe stenosis  with a patent internal carotid artery above on the left we will see her  again for further discussion and recommendation for scheduling of the  left carotid endarterectomy.   Larina Earthly, M.D.  Electronically Signed   TFE/MEDQ  D:  03/21/2009  T:  03/22/2009  Job:  [id number]   cc:   Lonia Blood, M.D.  Milus Mallick. Lodema Hong, M.D.

## 2011-01-12 NOTE — H&P (Signed)
[name], [name]               ACCOUNT NO.:  [id number]   MEDICAL RECORD NO.:  [id number]          PATIENT TYPE:  INP   LOCATION:  A326                          FACILITY:  [facility]   PHYSICIAN:  Osvaldo Shipper, MD     DATE OF BIRTH:  10-Dec-1931   DATE OF ADMISSION:  12/29/2008  DATE OF DISCHARGE:  LH                              HISTORY & PHYSICAL   PRIVATE MEDICAL DOCTOR:  Milus Mallick. Lodema Hong, M.D.   ADMITTING DIAGNOSES:  1. Cellulitis involving the left lower extremity, upper thigh area.  2. Chronic lower extremity wounds, in [place] boots.  3. History of congestive heart failure.  4. Morbid obesity.  5. History of type 2 diabetes.  6. History of hypertension.   CHIEF COMPLAINT:  Fever and chills for 1 day.   HISTORY OF PRESENT ILLNESS:  The patient is a 77 year old African  American female who was in her usual state of health until the  afternoon, yesterday, when she started feeling chills and checked her  temperature which was greater than 100.  She felt nauseous and vomited a  few times.  She has been having decreased p.o. intake.  She has had a  dry cough, denies any headache.  Denies any dizziness.  She says both  her legs have been itching and burning.  She has noticed clear  secretions from the left upper thigh where she has a fat tumor as she  describes it.  Her pain in that area has also been increasing.  Denies  any urinary complaints, denies any visual problems.   MEDICATIONS AT HOME:  Unfortunately, she did not bring all her  medication list, however the following is available:  1. Lasix 40 mg daily.  2. Hydrocodone 7.5 mg as needed.  3. Metformin 500 mg b.i.d.  4. Omeprazole 20 mg b.i.d.  5. Oxybutynin 5 mg b.i.d.  6. Potassium chloride 20 mEq daily.  7. Iron tablets 150 mg daily.  8. Singulair 10 mg daily.  9. Benzonatate.  10.Requip.   She is on other medications, unfortunately we do not know what these  are. The medication list will need to be obtained  from her PMD's office.   ALLERGIES:  PENICILLIN.   PAST SURGICAL HISTORY:  Hernia repair.   PAST MEDICAL HISTORY:  1. Coronary artery disease.  2. Morbid obesity.  3. Chronic lower extremity ulcers.  4. CHF.  5. Diabetes.  6. Dyslipidemia.  7. Hypertension.  8. History of myocardial infarction.  9. History of esophageal web.  10.Possible C. difficile in 2008.   SOCIAL HISTORY:  The patient lives in [place] with her family.  Quit  smoking 7 years ago.  Denies any alcohol use.  No illicit drug use.  Functional capacity is low at this time.   FAMILY HISTORY:  Positive for arthritis, diabetes and coronary artery  disease.   REVIEW OF SYSTEMS:  GENERAL:  Positive for weakness, malaise.  HEENT:  Unremarkable.  CARDIOVASCULAR:  Unremarkable. RESPIRATORY: As in HPI.  GI: Unremarkable.  GU:  Unremarkable.  NEUROLOGIC: Unremarkable.  PSYCHIATRIC: Unremarkable.  DERMATOLOGIC:  As in HPI.  MUSCULOSKELETAL:  As in HPI.  Other systems unremarkable.   PHYSICAL EXAMINATION:  VITAL SIGNS:  Temperature 103.1, and with  treatment has come down to 100.1.  Blood pressure is 116/35, it was as  low as 80s/40s.  Heart rate initially 123, currently about 90 and  regular.  Respiratory rate is 22.  Saturation is 100% in room air.  GENERAL: This is a morbidly obese African American female in no  distress.  HEENT:  There is no pallor, no icterus.  Oral mucosal membranes moist,  no oral lesions noted.  NECK:  Soft, supple.  No thyromegaly is appreciated and no cervical  lymphadenopathy is noted.  LUNGS:  Clear to auscultation anteriorly bilaterally.  CARDIOVASCULAR:  S1 and S2 normal and regular.  No murmurs appreciated.  No S3, S4, rubs or bruits.  ABDOMEN:  Obese, nontender, nondistended.  Bowel sounds present.  No  mass or organomegaly is appreciated.  NEUROLOGIC: She is alert and oriented x3, no focal neurological deficits  are present .  EXTREMITIES:  Examination of the left lower extremity  reveals almost  like a lipoma versus pannus on the left thigh.  There is minimal  drainage to the area, it is warm to touch.  No definite erythema is  present.  Both lower extremities, the legs, show well-healed ulcers, no  active drainage.  No warmth to touch is noted.   LABORATORY DATA:  White count is 17,400, MCV 89, hemoglobin 12.1,  platelet count 352,000.  Glucose is 136, BUN is 13, creatinine 1.29.  Albumin is 2.9.  BNP is less than 30.  UA negative for acute infection.  Blood culture is pending.  She had a chest x-ray which did not show any  acute cardiopulmonary process.   ASSESSMENT:  This is a 77 year old African American female who presents  with fever, chills and most likely source of cellulitis is from her left  thigh pannus.   PLAN:  1. Cellulitis.  Will treat with vancomycin and Cipro.  Review of her      microbiology shows that back in 2009, her blood grew Morganella      morgagni and this was sensitive to Cipro.  We will also cover her      for staph infection.  There is no drainage from the wound that can      be sent for wound cultures.  2. Chronic lower extremity ulcers.  Wound care will be provided.  3. Diabetes.  CBGs q.a.c. and h.s. Sliding scale will be provided.  I      will hold off on the metformin for now.  4. Mildly increased creatinine.  Should improve with IV hydration.  5. Leukocytosis.  This should improve with antibiotics and as the      infection improves.  6. History of congestive heart failure, coronary artery disease,      stable.  She should be on aspirin. EF in 2007 was 50%. No      indication for ACEI. Should ideally be on beta blockers if there is      no contraindication.  7. Deep vein thrombosis prophylaxis will be provided with low      molecular weight heparin.   Further management decisions will depend on results of further testing  and patient response to treatment.      Osvaldo Shipper, MD  Electronically Signed      GK/MEDQ  D:  12/30/2008  T:  12/30/2008  Job:  [id number]   cc:  Norwood Levo. Moshe Cipro, M.D.  Fax: [phone]

## 2011-01-12 NOTE — Consult Note (Signed)
NAMESUHA, SCHOENBECK               ACCOUNT NO.:  [id number]   MEDICAL RECORD NO.:  [id number]          PATIENT TYPE:  INP   LOCATION:  A306                          FACILITY:  [facility]   PHYSICIAN:  J. Darreld Mclean, M.D. DATE OF BIRTH:  October 30, 1931   DATE OF CONSULTATION:  DATE OF DISCHARGE:  03/19/2009                                 CONSULTATION   The patient is 77 year old female seen at request of the hospitalist.   The patient has pain and tenderness in the right shoulder.  It appears  and presents for several years.  Some 30 years ago started having  trouble with her shoulder.  It has bothered intermittently.  She denies  any direct trauma or unusual injury.  Pain has gotten worse over the  last several months.   X-rays shows significant degenerative joint disease of the shoulder with  a high-riding humeral head.  CT of the shoulder shows this as well with  signs of chronic rotator cuff tear.  She has degenerative changes of the  glenoid humeral head, the AC joint, and chromium area.  Neurovascular is  intact.  Range of motion is good.  She can forward flex to about 130 and  abduct to about 100.  Internal-external rotation is good with tenderness  and crepitus present.  Grip strengths are normal.  Neurovascular is  intact.   I have reviewed the rest of the chart.  She is in no other significant  medical problems, not going to repeat them, and incorporated by  reference source of nurses' notes and vital signs.   IMPRESSION:  Significant degenerative disease of the right shoulder with  chronic pain and tenderness bursitis.   PLAN:  I have injected Xylocaine solution of 1 mL Depo-Medrol 40 with 4  mL, 1% Xylocaine and for posterior approach with sterile technique.  She  tolerated it well.   I will see in the office in approximately 2 weeks.  She will call and  make an appointment after discharge here.  Any difficulty, let me know.           ______________________________  Shela Commons. Darreld Mclean, M.D.     JWK/MEDQ  D:  03/19/2009  T:  03/19/2009  Job:  [id number]

## 2011-01-12 NOTE — Discharge Summary (Signed)
[name], [name]               ACCOUNT NO.:  [id number]   MEDICAL RECORD NO.:  [id number]          PATIENT TYPE:  INP   LOCATION:  A305                          FACILITY:  [facility]   PHYSICIAN:  Dorris Singh, DO    DATE OF BIRTH:  09/15/1931   DATE OF ADMISSION:  03/17/2008  DATE OF DISCHARGE:  07/24/2009LH                               DISCHARGE SUMMARY   PRIMARY CARE PHYSICIAN:  Milus Mallick. Lodema Hong, M.D.   CONSULTS:  Tilda Burrow, M.D.   TESTING DONE:  1. On March 17, 2008 a 2-view chest x-ray demonstrated limited chest x-      ray as above.  Mild cardiac enlargement.  Low lung volumes with      vascular crowding and bibasilar atelectasis.  One cannot exclude      bibasilar infiltrates.  2. Portable chest x-ray on March 19, 2008 demonstrated no active      disease.  3. Portable chest x-ray March 20, 2008 demonstrated right PICC line      seen in proper placement.   HISTORY AND PHYSICAL:  This was done by Dr. Thomasenia Bottoms, please  refer; but to summarize, [name] is a 77 year old African American  female who was brought in for excruciating left lower leg pain.  The  patient has a history of chronic ulcers in her left leg and right leg,  but states that she started having fever and chills.  She was brought in  to be evaluated.  She was found to have cellulitis of her bilateral legs  and was started on IV antibiotics.  She was started on Levaquin and  continued to improve.  Originally her white count was 1300, and upon  discharge it was normal.  The patient continued to progress without  incident.  There was some concern of a malodorous discharge the nurses  had noticed.  The patient discussed that she had a vaginal infection in  the past before; however, she had noticed the discharge looked changed.  So, at this point and time we decided to consult Dr. Emelda Fear who is her  OB/GYN, and has been seeing her for over 20 years.  Dr. Emelda Fear came and did a pelvic on  her today (March 22, 2008), and  gave her a prescription for MetroGel as well as metronidazole .   She continued to improve.  The patient was started on vancomycin, when  clinically it looked like her legs were not getting any better.  Also, a  PICC line was placed on March 20, 2008.  We decided to go ahead and start  her on IV vancomycin for at least 7-10 days, which she will continue at  home.  Will set up home health to help with activities of daily living,  and will have her follow up with her primary care doctors at discharge  within 1 week.   DISCHARGE MEDICATIONS:  She will be sent home on:  1. Oxybutynin 5 mg b.i.d.  2. Simvastatin 20 mg at bedtime.  3. Ropinirole 2 mg at bedtime.  4. Hydrocodone 7.5/750 two p.o. b.i.d.  5. Propoxyphene APAP 100/650 at bedtime.  6. Iron 50 mg daily.  7. Potassium 20 mEq p.o. daily.  8. Singulair 10 mEq p.o. daily.  9. Lasix 40 mg p.o. daily.  10.Omeprazole 20 mg p.o. daily.  11.Metformin 500 mg p.o. daily.  12.Ambien 10 mg p.o. daily.   Dr. Emelda Fear is sending her home on MetroGel one applicator at bedtime  for 7 days, and Flagyl 500 mg one p.o. t.i.d. for 7 days.  Also, we will  give her a prescription for Diflucan since she is on vancomycin and has  been on a lot of antibiotics and has been having some vaginal discharge;  we will give her a prescription for that in case she needs it.   PHYSICAL EXAMINATION:  On the patient's exam today she was resting  comfortably in bed; seen with her son in the room, who spoke to me with  a very unprofessional tone while I was in the room.  HEART:  Regular rate and rhythm.  LUNGS:  Clear to auscultation bilaterally.  ABDOMEN:  Soft, nontender and nondistended.  EXTREMITIES:  Legs were wrapped up, but no drainage noted.  VITALS:  Stable for today and her blood pressure is also stable at  100/56.   DISPOSITION:  To home.   DISCHARGE ACTIVITIES:  She will increase her activity slowly.   DIET:  She  will stay on a diabetic diet.   FOLLOWUP:  Dr. Milus Mallick. Simpson in 1 week and she will be placed on  the above medications.  She is to return if symptoms worsen.      Dorris Singh, DO  Electronically Signed     CB/MEDQ  D:  03/22/2008  T:  03/22/2008  Job:  [id number]   cc:   Milus Mallick. Lodema Hong, M.D.  Fax: [phone]

## 2011-01-15 NOTE — Discharge Summary (Signed)
[name], [name]               ACCOUNT NO.:  [id number]   MEDICAL RECORD NO.:  [id number]          PATIENT TYPE:  INP   LOCATION:  A307                          FACILITY:  [facility]   PHYSICIAN:  Osvaldo Shipper, MD     DATE OF BIRTH:  Sep 30, 1931   DATE OF ADMISSION:  11/11/2006  DATE OF DISCHARGE:  03/21/2008LH                               DISCHARGE SUMMARY   PRIMARY CARE PHYSICIAN:  Dr. Lodema Hong.   The patient was seen by the gastroenterology service while she was  admitted here.   DISCHARGE DIAGNOSES:  1. Diarrhea, resolved, thought to be secondary to C Diff.  2. Dysphagia likely from esophageal web, improved.  3. Morbid obesity.  4. Type 2 diabetes.  5. Dyslipidemia.  6. Systolic congestive heart failure compensated.  7. Chronic home O2 use, stable.   Please see the H&P dictated by Dr. Benson Setting for details regarding the  patient's presenting illness.   HOSPITAL COURSE:  Briefly, this is a 77 year old African-American female  who presented with complaints of generalized weakness, difficulty  swallowing, and diarrhea. The patient has a history of presumed C Dif in  the past and she was empirically started on Flagyl. She underwent a  chest x-ray where there was a suspicion about pneumonia, hence she was  started on Levaquin as well. The patient underwent a CAT scan of the  abdomen and pelvis without contrast which showed some fatty infiltration  of the liver, distention of  gallbladder without evidence for  cholecystitis and a uterine fibroid but no other acute findings were  noted. The patient's diarrhea improved with Flagyl. She is completely  resolved of any diarrhea at this point. She is feeling much better and  she has been ambulating with the help of physical therapist.   The patient is also complaining of some dysphagia. Apparently she was to  undergo EGD and possible esophageal dilatation a couple months ago but  it could not be done because the patient was on aspirin.  She was  apparently scheduled for an EGD and colonoscopy at [facility] this past week,  however, because she was in the hospital and she was sick, we could not  discharge her to have that procedure at [facility]. Hence, we reconsulted our  gastroenterologist here and she indeed underwent an EGD and diltation  yesterday. There was a noncritical Schatzki's ring that was noted. There  was a possibility of esophageal web at the cervical part of the  esophagus. After the dilatation, the patient's dysphagia has improved  significantly, hence it is felt that esophagus is probably the source of  her dysphagia. She did undergo evaluation for oropharyngeal dysphagia at  [facility] a few months ago which was negative. If her dysphagia recurs,  she may need another GI evaluation and if no clear etiology is found she  may benefit from a neurological consultation.   Hypertension was also an issue during this admission. This was likely  because of dehydration and a combination also of sepsis. We held her  Lasix and she improved. Her blood pressures are now running more than  100 systolic and 60s diastolic. I have told her to hold off on her Lasix  for a few more days and then she may restart it. Otherwise the patient  is ambulating with no problems and does not have any dizziness or light-  headedness.   Anemia. Her hemoglobin was 12.8 on presentation, dropped down to about  10.9 possibly from dilution and over the past 2 days been 9.6 and 9.7.  no evidence for acute blood loss is available. The patient will need an  outpatient colonoscopy which will be set up by GI. She will prescribed  Nu-Iron on discharge.   Her other medical issues including diabetes, CHF, restless leg syndrome,  anxiety, COPD, all remain stable. She did have a few instances of  hypoglycemia which were easily corrected and they were because she was  n.p.o. for a procedure.   She also had an elevated white count during this admission going  up to  25,000 and the last reading from today is 12,000 and she is improving  with that respect as well. Stool studies were sent off as well that were  negative for C Dif although they were positive for white blood cells.   DISCHARGE MEDICATIONS:  1. Nu-Iron 150 mg once daily.  2. Flagyl 250 mg p.o. t.i.d. for 10 more days.  3. She has been asked to start her Lasix on March 27.  4. She will otherwise resume all of her home medications as before.      Please note that she is on Prilosec and not Protonix as mentioned      in the H&P. Otherwise no other changes have been made to any of her      medications.   FOLLOWUP:  Dr. Lodema Hong in 10 days, with GI for a colonoscopy in 4 weeks.   DIET:  She may have a modified carbohydrate diet.   ACTIVITY:  She will continue with home health as before which included  physical therapy.   Please also note the patient had to have a PICC line placed because of  poor venous access. This will be discontinued on discharge.   The procedure that she underwent apart from the PICC line included an  EGD done by Dr. Karilyn Cota with dilatations.   Total time at discharge about 40 minutes.      Osvaldo Shipper, MD  Electronically Signed     GK/MEDQ  D:  11/18/2006  T:  11/18/2006  Job:  [id number]   cc:   Lionel December, M.D.  [id number]  [place]  [place] [address]   Milus Mallick. Lodema Hong, M.D.  Fax: [phone]

## 2011-01-15 NOTE — H&P (Signed)
NAMEALYSON, [name]               ACCOUNT NO.:  [id number]   MEDICAL RECORD NO.:  [id number]          PATIENT TYPE:  INP   LOCATION:  IC07                          FACILITY:  [facility]   PHYSICIAN:  Mobolaji B. Bakare, M.D.DATE OF BIRTH:  02-02-1932   DATE OF ADMISSION:  07/22/2006  DATE OF DISCHARGE:  LH                                HISTORY & PHYSICAL   PRIMARY CARE PHYSICIAN:  Milus Mallick. Lodema Hong, M.D.   CHIEF COMPLAINT:  Shortness of breath.   HISTORY OF PRESENTING COMPLAINT:  [name] started experiencing shortness  of breath on about the 15 July 2006.  She had an ER visit, emergency  room visit, on the 15 July 2006, complaining of shortness of breath.  She had a workup at that time including a chest x-ray which showed vascular  congestion without edema or infiltrate.  A D-dimer was elevated.  She had a  V/Q scan which was reported as negative for pulmonary embolism.  She was  then treated for anxiety.  She followed up with Dr. Lodema Hong on 19 July 2006.  She was given prescriptions for Levaquin.  She has used Levaquin 750  mg daily for 4 days.  She was feeling somewhat better but today about 3 p.m.  the patient developed sudden onset of shortness of breath.  There was no  chest pain and she has been short of breath since.  She was also febrile  with a temperature of 104.8, associated with chills.  She had a chest x-ray  which showed probable right lung pneumonia.  The patient has leukocytosis  with bandemia.  Cardiac markers including troponin I, CK-MB have been  elevated.  Initial cardiac markers were CK-MB 11.5 and troponin of 0.9, and  a myoglobin of greater than 500.  The second set of cardiac markers with CK-  MB 11.5, troponin of 2.56, and myoglobin greater than 500.  EKG shows narrow  complex tachycardia without acute ST elevation.  She does have a high J  point.  This is clearly changed from EKG done in 2005.  ER physician  discussed with cardiologist, on  call at [facility], and the  decision was this was not acute ST elevation.  Attempt was made to transfer  the patient over to a higher level of care.  There is no bed available in  the intensive care unit at [facility] and [facility], hence,  the patient will be admitted here at [facility].  The patient has had a blood culture drawn in the emergency room.  She has  received 1 gram of ceftriaxone and Zithromax p.o., chewable aspirin, four  baby aspirin, and she is currently on heparin.  She was on nitroglycerin  infusion for less than 30 minutes.  She became hypotensive and nitroglycerin  was discontinued.  Blood pressure is responding to IV fluid.   REVIEW OF SYSTEMS:  The patient denies chest pain.  She does have shortness  of breath as stated above.  There is cough and cough is occasionally  productive of sputum.  There is no  pleuritic chest pain.  No diarrhea,  vomiting.  She felt diaphoretic while in the emergency room.  There is no  abdominal pain.  She has a history of lower extremity edema and chronic  venous insufficiency.   PAST MEDICAL HISTORY:  1. Diabetes mellitus, type 2.  2. History of hyperlipidemia.  3. Hypertension.  4. Chronic venous insufficiency.  5. Osteoarthritis.  6. Gastroesophageal reflux disease.  7. Ventral hernia, status post surgery.   CURRENT MEDICATIONS:  1. Ambien 12.5 mg p.r.n. q.h.s.  2. Verapamil 200 mg q.h.s.  3. Metformin 500 mg twice a day.  4. Potassium chloride 20 mEq once a day.  5. Lipitor 10 mg once a day.  6. Lasix 80 mg once a day.  7. Micardis 80 mg once a day.  8. Darvocet-N 100 p.r.n.  9. Hydroxyzine 25-50 q.h.s. at bedtime.  10.Levaquin 750 mg daily.  11.Benzonatate 200 mg three times a day.  12.Combivent p.r.n.  13.Requip 15 mg daily.  14.Hyoscyamine.   ALLERGIES:  SHE IS ALLERGIC TO PENICILLIN WITH HIVES.   FAMILY HISTORY:  Noncontributory.   SOCIAL HISTORY:  The patient resides with  family.  She does not smoke  cigarettes or drink alcohol.  She uses a wheelchair to ambulate and  sometimes a walker.   PHYSICAL EXAMINATION:  VITAL SIGNS:  Blood pressure 80/42, pulse of 123, O2  sat of 97% on 3 liters, temperature 104.8.  Currently, temperature is 99.1.  The patient is dyspneic with a respiratory rate of 20.  GENERAL:  She is awake, alert, oriented in time, place, and person.  HEENT:  Normocephalic atraumatic head.  NECK:  No elevated JVD.  She has a short neck.  No palpable thyromegaly.  LUNGS:  Reduced air entry on the right lung field.  No wheeze.  No rhonchi.  CARDIOVASCULAR:  S1 S2 tachycardia.  ABDOMEN:  Obese, soft, nontender.  Bowel sounds present.  EXTREMITIES:  Chronic venous stasis eczema.  She has a fat pad on the left  thigh.  CNS:  The patient is oriented x3.  No focal neurologic deficit.   INITIAL LABORATORY DATA:  BNP less than 30.  Sodium 135, potassium 3.5,  chloride 100, CO2 20, glucose 179, BUN 15, creatinine 1.3, calcium 8.8.  White cells 15.6, hemoglobin 14.4, hematocrit 44.3, platelets 319,  neutrophils 49%, lymphocytes 20%, bands greater than 20% vacuolated  neutrophils, platelets are large.  Urinalysis:  Specific gravity greater  than 1.030, protein is 30, negative for nitrate and leukocytes.  Cardiac  markers as mentioned in the HPI.  Urine microscopy unremarkable.  Chest x-  ray:  Limited examination possible right lung pneumonia, borderline  cardiomegaly.   ASSESSMENT:  [name] is a 77 year old African American female who started  with an upper respiratory tract symptoms about 10 days ago which later  resulted in cough and shortness of breath.  She was on treatment for  bronchitis with Levaquin and the patient was not improving.  She presented  to the emergency room today with worsening shortness of breath.  She has  right lung pneumonia on chest x-ray, leukocytosis with bandemia.  She is septic with a blood pressure of 80 systolic.   The patient has elevated  troponin and new EKG changes.   PLAN:  1. Community-acquired pneumonia.  Blood cultures have been drawn.  We will      obtain sputum culture, start IV vancomycin 1.5 gram now, then as per      pharmacy.  Primaxin 500  mg IV q.6 h.  (The patient has allergies to      penicillin with hives).  Oxygen by nasal cannula to keep O2 sats      greater than 90%.  We will check ABG.  2. Septic shock secondary to number one. She has received one and a half      liters of Normal Saline, will continue IVF and if necessary add      levophed. Check lactic acid level, cortisol level.  We will start on IV      cortisone 100 mg x1 now, and 50 mg q.6 h.  Although she received a      minimal amount of nitroglycerin in the emergency room, this might also      have contributed to the shock state.  In addition, she has got ongoing      myocardial infarction.  3. Non-ST elevate myocardial infarction.  Continue heparin infusion,      aspirin 325 mg daily, Plavix 75 mg daily.  We will obtain a 2D      echocardiogram and get a cardiology consult.  The patient is      tachycardic but low blood pressure precludes the use of Lopressor.  4. Diabetes mellitus, type 2.  We will start ICU hyperglycemic protocol in      this setting.  Check hemoglobin A1c.  5. Obesity.  6. Hyperlipidemia.   Attempt at transferring the patient to a higher level of care was  unsuccessful because of lack of beds.  I discussed the situation with the  patient and family.  We will continue to manage the patient in [facility] with the help of eLink.      Mobolaji B. Corky Downs, M.D.  Electronically Signed     MBB/MEDQ  D:  07/22/2006  T:  07/23/2006  Job:  [id number]   cc:   Milus Mallick. Lodema Hong, M.D.  Fax: [phone]

## 2011-01-15 NOTE — Op Note (Signed)
[name], [name]               ACCOUNT NO.:  [id number]   MEDICAL RECORD NO.:  [id number]          PATIENT TYPE:  INP   LOCATION:  A307                          FACILITY:  [facility]   PHYSICIAN:  Lionel December, M.D.    DATE OF BIRTH:  October 12, 1931   DATE OF PROCEDURE:  11/16/2006  DATE OF DISCHARGE:                               OPERATIVE REPORT   PROCEDURE:  Esophagogastroduodenoscopy with esophageal dilation.   INDICATIONS:  77 year old African American female who has solid food  dysphagia.  She had her esophagus dilated a few months ago by Dr. Russella Dar  in [place] and this provided temporary relief.  She was scheduled to  undergo repeat dilation, but because of scheduling problems, repeat  dilation has been planned but yet accomplished.  She does not have any  difficulty with liquids.  She also has diarrhea felt to be due to  recurrent C. diff and she is responding to therapy.  Her WBC was 25,000  two days ago and is down to 14.  The procedure risks were reviewed with  the patient and informed consent was obtained.   MEDS FOR CONSCIOUS SEDATION:  Benzocaine spray for pharyngeal topical  anesthesia, Versed 4 mg IV.   FINDINGS:  The procedure was performed in the endoscopy suite.  The  patient's vital signs and O2 sat were monitored during the procedure and  remained stable.  The patient was placed in the left lateral position  and the Pentax videoscope was passed via oropharynx without any  difficulty into the esophagus.  The mucosa of the esophagus was  carefully examined and was normal.  There was a non-critical ring at the  GE junction which was at 37 cm and hiatus at 39.  There was nodularity  to mucosa at the GE junction on the gastric side, i.e., central polyp.  Biopsy was taken on the way out.   Stomach:  The stomach was empty and distended very well with  insufflation.  The folds of the proximal stomach were normal.  Examination of the mucosa revealed some focal erythema  in antrum but no  erosions or ulcers were noted.  The pyloric channel was patent.  Angularis, fundus and cardia were examined by retroflexing the scope and  were normal.   Duodenum:  The bulbar mucosa was normal.  The scope was passed in the  second part of the duodenum where mucosa and folds were normal.   The scope was withdrawn back in the body of stomach.  A balloon dilator  was passed through the scope channel and positioned across the distal  esophagus.  The GE junction was dilated initially to 18 mm.  There was  no mucosal disruption.  The diameter was then increased to 20 mm and,  once again, there was no disruption noted.  The endoscope was withdrawn.  A biopsy was taken from the nodular mucosa (one of the two nodules had  erosion over it).  The endoscope was withdrawn.   The esophagus was subsequently dilated by passing 60 [place] Maloney  dilator to full insertion.  As the dilator was  withdrawn, the endoscope  was passed again. There was small tear in the cervical esophagus  possibly indicative of event.  No mucosal disruption noted in the distal  esophagus.  The endoscope was withdrawn.  The patient tolerated the  procedure well.   FINAL DIAGNOSIS:  1. Non-critical ring at GE junction with small sliding hiatal hernia      and nodular mucosa at the GE junction, i.e., sentinel polyp which      was biopsied for histology.  2. GE junction dilated with the balloon up to 20 mm without mucosal      disruption.  3. Esophagus also dilated by passing 60 French Maloney dilator      resulting in mucosal disruption at cervical esophagus possibly      indicative of web.  4. No evidence of peptic ulcer disease.   RECOMMENDATIONS:  1. Will resume her ASA and Lovenox in a.m.  2. Soft diet.  3. If she remains with dysphagia, will proceed with barium pill      esophagogram and evaluation by speech pathologist.  4. [name] will return for colonoscopy on an outpatient basis.       Lionel December, M.D.  Electronically Signed     NR/MEDQ  D:  11/16/2006  T:  11/16/2006  Job:  [id number]   cc:   Milus Mallick. Lodema Hong, M.D.  Fax: [phone]

## 2011-01-15 NOTE — Cardiovascular Report (Signed)
[name], [name]               ACCOUNT NO.:  [id number]   MEDICAL RECORD NO.:  [id number]          PATIENT TYPE:  INP   LOCATION:  2103                         FACILITY:  [facility]   PHYSICIAN:  Veverly Fells. Excell Seltzer, MD  DATE OF BIRTH:  01-12-1932   DATE OF PROCEDURE:  07/24/2006  DATE OF DISCHARGE:                            CARDIAC CATHETERIZATION   PROCEDURE:  Left heart catheterization, selective coronary angiography,  left ventricular angiography.   INDICATIONS:  [name] is a very nice 77 year old woman who was  admitted to [facility] on November 24th with suspected pneumonia and  hypoxic respiratory failure. She also initially had elevated cardiac  biomarkers in the absence of significant ST elevation.  She was treated  with intravenous antibiotics and earlier today developed acute ST-  segment elevation with marked anterior ST elevation and the development  of severe chest pain and pressure.  The patient's clinical status is  very complex, mainly secondary to her morbid obesity.  She she weighs  over 400 pounds.  In the setting of her ongoing chest pain and new ST  elevation, she was referred for emergency cardiac catheterization.  I  had a long discussion with the patient and her family about her  increased risk in the setting of her morbid obesity, but we elected to  proceed with cardiac cath based on the patient's ongoing injury current  on her ECG.   PROCEDURE IN DETAIL:  Risks and indications of the procedure were  explained in detail to the patient.  Informed consent was obtained.  The  patient's body habitus was not amendable to femoral artery access.  She  had multiple IVs throughout her right arm as well as an arterial line in  the right radial artery.  I elected to access her left radial artery.  The left wrist was prepped, draped and anesthetized with 1% lidocaine  under normal sterile conditions.  Using a micro-puncture kit, the left  radial artery was accessed,  and a 6-French arterial sheath was inserted  via the modified Seldinger technique.  Despite the wire passing easily  and good arterial back flow, it was initially difficult to aspirate the  sheath which I suspected was secondary to radial artery vasospasm.  The  patient was given a cocktail of the verapamil, nitroglycerin and heparin  through the sheath, and the sheath became easy to aspirate and flush.  A  6-French JR-4 catheter was inserted, and images of the right coronary  artery were taken.  Following that, a 6-French CLS 3-mm guiding catheter  was inserted into the left coronary artery, and angiographic pictures of  the left coronary artery were taken.  Surprisingly, there was no  significant stenosis in any of her vessels, and there was clearly no  acute occlusion.  Following selective coronary angiograph, an angled  pigtail catheter was inserted into the left ventricle, and left  ventricular pressure was recorded.  A very shallow 10-degree right  anterior oblique left ventriculogram was performed as we were unable to  visualize any steeper angles due to the patient's body habitus.  At the  conclusion of the case, the patient's radial sheath was pulled, and  manual pressure followed by a radial compression device were used for  hemostasis.   FINDINGS:  Aortic pressure is 115/67 with a mean of 85. Left ventricular  pressure is 121/17 with an end-diastolic pressure of 38.   Coronary angiography:  The left main stem has mild luminal  irregularities.  It bifurcates into the LAD and left circumflex.  The  LAD is a large-caliber vessel that courses down to the left ventricular  apex.  It wraps around the apex as well.  There are mild luminal  irregularities throughout the LAD in the proximal and mid portions, but  there are no significant angiographic stenoses seen.  The LAD gives off  a first diagonal branch that has no significant angiographic disease.   The left circumflex is  somewhat difficult to visualize due to overlap  with the LAD.  I was unable to take steep enough angles to separate the  2 vessels out well.  The left circumflex is a medium caliber vessel that  courses down and gives off a first marginal branch.  The left circumflex  then divides into twin vessels in its mid portion.  There is no  significant angiographic disease in the left circumflex system.   The right coronary artery is dominant.  There is a 25% stenosis in the  proximal portion of the right coronary artery.  The remainder of the  vessel appears free of any significant angiographic disease.  Distally,  the right coronary artery bifurcates into the PDA and posterior AV  segment which gives off 2 small posterolateral branches.  There is no  significant disease seen in the distal branches of the right coronary  artery.   Left ventriculogram performed in a 10-degree right anterior oblique  projection shows mid anterior and mid inferior areas of akinesis.  The  basal segments and the apex appear spared.  The left ventricular  ejection fraction is 35%.   ASSESSMENT:  1. Nonobstructive coronary artery disease.  2. Severe segmental left ventricular dysfunction.   I suspect the patient's most likely diagnosis is Takotsubo's  cardiomyopathy.  Another possibility is that she has had transient  coronary vasospasm.  However, her wall motion abnormalities are in  multiple vascular territories, and this is less likely.  We will treat  her with intravenous nitrates as well as initiation of an ACE inhibitor  and beta blocker.  Her LV filling pressures are elevated, and she will  need afterload reduction as tolerated.  The findings were reviewed in  detail with the patient and her family.      Veverly Fells. Excell Seltzer, MD  Electronically Signed     MDC/MEDQ  D:  07/24/2006  T:  07/24/2006  Job:  [id number]

## 2011-01-15 NOTE — Procedures (Signed)
NAME:  SHAUNIKA, ITALIANO                         ACCOUNT NO.:  [id number]   MEDICAL RECORD NO.:  [id number]                   PATIENT TYPE:  OUT   LOCATION:  RAD                                  FACILITY:  [facility]   PHYSICIAN:  Vida Roller, M.D.                DATE OF BIRTH:  [birth year]   DATE OF PROCEDURE:  01/21/2004  DATE OF DISCHARGE:                                  ECHOCARDIOGRAM   PRIMARY PHYSICIAN:  Dr. Quentin Cornwall NUMBER:  [id number]   TAPE COUNT:  [id number] through [id number]   This is a 77 year old preop for an umbilical herniorrhaphy.  No previous  studies.  Technical quality of this study is extremely poor.  The primary  study was performed with IV contrast.  The overall ejection fraction appears  to be normal estimated at 55-60%.  There are no obvious wall motion  abnormalities seen.  Assessment of valvular architecture and function is  beyond the limits of this study.      ___________________________________________                                            Vida Roller, M.D.   JH/MEDQ  D:  01/21/2004  T:  01/22/2004  Job:  [id number]

## 2011-01-15 NOTE — H&P (Signed)
[name], [name]               ACCOUNT NO.:  [id number]   MEDICAL RECORD NO.:  [id number]          PATIENT TYPE:  INP   LOCATION:  6709                         FACILITY:  [facility]   PHYSICIAN:  Madaline Savage, MD        DATE OF BIRTH:  1932/04/22   DATE OF ADMISSION:  09/01/2006  DATE OF DISCHARGE:                              HISTORY & PHYSICAL   CHIEF COMPLAINT:  Nausea, vomiting, and diarrhea.   HISTORY OF PRESENT ILLNESS:  [name] is a 77 year old lady who was  transferred from [facility] where she was admitted from  August 29, 2006, to September 01, 2005, for management of nausea,  vomiting, and diarrhea.  She apparently presented to [facility] ER with  complaints of diffuse abdominal pain, nausea, vomiting, and diarrhea.  When she was initially evaluated, she had an elevated white count and a  CT scan was done without contrast because of elevated creatinine.  They  were still continuing her workup, but her symptoms did not improve, and  so the family requested that the patient be transferred to [facility].  Right now, she still continues to throw up.  She threw up  orange liquid just after coming here.  She has not seen any blood or any  dark colored matter in her vomitus.  She also continues to have  diarrhea, 4-5 episodes a day.   PAST MEDICAL HISTORY:  1. Congestive cardiac failure with ejection fraction of 50%.  She was      intubated in December 2007 at [facility] secondary to CHF.  2. Morbid obesity.  3. Diabetes mellitus type 2 which she states is borderline diabetes.  4. Dyslipidemia.  5. Possible restless leg syndrome.  6. Hypertension.  7. Recent pneumonia.  8. Obesity/hypoventilation syndrome.   ALLERGIES:  She is allergic to PENICILLIN but has tolerated  cephalosporins in the past.   MEDICATIONS:  At [facility] at the time of discharge were  aspirin 325 mg daily, ciprofloxacin IV, Flagyl IV, Lovenox for DVT  prophylaxis, Zocor, Xanax, Dilaudid p.r.n., Reglan, Zofran, Tylox, and  at that time, her blood pressure pills including Avapro and Lopressor  were held.   SOCIAL HISTORY:  She lives with her daughter.  She quit smoking eight  years ago.  She has a 25-30-pack-year smoking history.  No alcohol use.  No illicit drug use.   FAMILY HISTORY:  Significant for diabetes in her father, otherwise,  unremarkable.   REVIEW OF SYSTEMS:  GENERAL:  She denies any recent weight loss or  weight gain.  HEENT:  No headaches, no blurred vision.  CARDIOVASCULAR:  Denies chest pain or palpitations.  RESPIRATORY SYSTEM:  No cough.  GI:  She does have nausea, vomiting, and diarrhea.  GENITOURINARY SYSTEM:  Denies any dysuria.  MUSCULOSKELETAL:  Denies any weakness or pain.   PHYSICAL EXAMINATION:  GENERAL:  She is alert and oriented x3.  VITAL SIGNS:  Temperature 99, pulse rate 81, blood pressure 106/53,  respirations 20, oxygen saturation 95% on room air.  HEENT:  Head normocephalic,  atraumatic.  Pupils bilaterally equal and  reactive to light.  Mucosa is moist.  NECK:  Supple, no JVD, no carotid bruit.  CARDIOVASCULAR SYSTEM:  S1 and S2 heard, regular rate and rhythm, no  murmurs, gallops, and rubs.  CHEST:  Clear to auscultation.  ABDOMEN:  Soft, there is minimal tenderness diffusely over the abdomen,  bowel sounds are heard.  EXTREMITIES:  No cyanosis, clubbing, and edema.   LABORATORY DATA:  Labs done at [facility] show creatinine 1.3  on August 31, 2006.  Liver function tests were normal except for an  albumin of 2.  She had an elevated white count of 20.2 on August 31, 2006, with 81% neutrophils.  She had a lipase of 10 and her urinalysis  was negative.  She had a CAT scan without contrast done on August 29, 2006, which showed normal non-obstructive bowel gas pattern, diffuse  thinning of the anterior abdominal wall without focal hernia, probable  left lower lobe atelectasis or  scarring.   IMPRESSION:  1. Nausea, vomiting, diarrhea, rule out gastroenteritis and C. diff.  2. Morbid obesity.  3. Leukocytosis.  4. Hypertension.  5. History of congestive heart failure.  6. Malnutrition with an albumin of 2.  7. Diabetes mellitus.   PLAN:  This is a 77 year old morbidly obese lady with multiple medical  problems who comes in with nausea, vomiting, and diarrhea over the last  5-6 days.  We will send her stool for C. diff again, we have to rule her  out for that because she was treated with antibiotics in her recent  hospital stay two weeks ago.  She still has her gallbladder, will check  an ultrasound of her abdomen, right upper quadrant, to look for  cholecystitis.  We will continue to treat her symptomatically with  Reglan, Zofran, and Phenergan, and we will also put her on a proton pump  inhibitor.  I will also consult GI on call which is the [facility] to see in the morning regarding her symptoms.  I will  put her on sliding scale coverage for her diabetes and I will put her on  Lasix at this point of time for her blood pressure.  Right now, her  blood pressure is on the low side.  We can re-start her blood pressure  medications once her blood pressure starts coming up.  We will also get  routine labs and a BNP on her.  I will continue her antibiotics of  Ciprofloxacin and Flagyl at this point of time and I will put her on DVT  prophylaxis.  Please note that she is a full code.      Madaline Savage, MD  Electronically Signed     PKN/MEDQ  D:  09/01/2006  T:  09/01/2006  Job:  [id number]   cc:   Milus Mallick. Lodema Hong, M.D.

## 2011-01-15 NOTE — Consult Note (Signed)
NAMEBETHA, [name]               ACCOUNT NO.:  [id number]   MEDICAL RECORD NO.:  [id number]          PATIENT TYPE:  INP   LOCATION:  2103                         FACILITY:  [facility]   PHYSICIAN:  Reginia Forts, MD     DATE OF BIRTH:  1931/12/06   DATE OF CONSULTATION:  DATE OF DISCHARGE:                                   CONSULTATION   CONSULTING PHYSICIAN:  Dr. Shan Levans.   REASON FOR CONSULTATION:  Positive troponin.   HISTORY OF PRESENT ILLNESS:  [name] is a 77 year old African-American  woman with morbid obesity, weighing approximately 404 pounds, who was  admitted with pneumonia and septic shock.  Presents as a consultation for  positive troponin and CK.  The patient has never had prior cardiac history.  She was short of breath on the 16th of November and was treated for  community acquired pneumonia with Levaquin.  Over the next 4 days, the  patient became febrile and subsequently presented to an outside hospital,  [facility].  There, she was noted to have a positive troponin of 1.3 along  with elevations in her LFT's and worsening renal function.  She required  pressor support along with IV antibiotics and was subsequently transferred  here.  Patient denies any chest pain but notes chronic shortness of breath,  worse during her current pneumonia and septic shock state.  She was treated  with IV Heparin and stress dose of steroids, and has subsequently been  weaned off of Levophed.  She states that she is unable to exert herself at  home and requires a walker or wheelchair.  She recently discontinued her  Lasix therapy at the time of Thanksgiving due to inconvenience of using the  bathroom when near guests.  She suspects that this may have exacerbated her  annoying shortness of breath.   PAST MEDICAL HISTORY:  1. Morbid obesity with a weight of 404 pounds.  2. Diabetes mellitus.  3. Hypertension.  4. Hyperlipidemia.  5. Chronic renal insufficiency.  6.  Osteoarthritis.  7. GERD.  8. Ventral hernia status post repair.   ALLERGIES:  PENICILLIN.   CURRENT MEDICATIONS:  Are Heparin drip, Albuterol and Atrovent nebs, Plavix  75 mg a day, guaifenesin 600 mg twice a day, Solu-Cortef 50 mg q.6 hours  p.r.n. IV, Primaxin 500 mg q.6 hours, sliding scale insulin, Protonix 40 mg  a day, Zocor 20 mg a day, Vancomycin, and Levaquin.   SOCIAL HISTORY:  Patient is retired from [employer].  She lives in  the [place] area and denies any history of alcohol or tobacco use.  She  uses a walker or a wheelchair at home.   FAMILY HISTORY:  Is notable for diabetes.   REVIEW OF SYSTEMS:  Notable for chronic orthopnea, dyspnea on exertion,  shortness of breath, chronic hip and knee pain.  Rest of review systems is  reviewed and is negative.   PHYSICAL EXAMINATION:  Temperature is 97.9, pulse 100, blood pressure  150/70, respiratory rate is 22-29, SAT 'ing 98% on 2L.  In general, the patient is awake, alert, oriented  x3, in no acute distress.  She is morbidly obese.  HEENT:  Is normocephalic, atraumatic.  Pupils equal, round, reactive to  light.  Extraocular movements are intact.  NECK:  Demonstrates no bruits, no JVD.  CARDIOVASCULAR:  Regular rhythm, normal rate, distant heart sounds.  LUNGS:  Demonstrate distant breath sounds.  Unable to fully assess lung  field.  ABDOMEN:  Obese, no frank rebound or guarding.  Unable to determine bowel  sounds.  EXTREMITIES:  Demonstrate no cyanosis or clubbing.  NEURO:  Cranial nerves 2-12 grossly intact.  No focal, musculoskeletal, or  sensory deficits.  PSYCH:  Demonstrates normal affect.  MUSCULOSKELETAL:  Demonstrates no joint deformities or effusions.  Chest x-ray demonstrates right sided airspace disease consistent with  pneumonia.  EKG demonstrates normal sinus rhythm with a rate of 84, left  axis deviation with anterolateral Q-waves and S-T elevations in V2-V6  suggestive of aneurysmal  formation.   LABORATORY DATA:  Labs demonstrate a white count of 34.1, hemoglobin of  13.1, platelets of 294,000, BUN of 14, creatinine of 0.9 down from a  creatinine of 1.5, troponin is currently at 18.49 with a CK of 874, lactic  acid is down to 2 from 3.1.   ASSESSMENT/PLAN:  This is a 77 year old African-American woman with morbid  obesity and pneumonia, septic shock who presents with a non S-T elevation MI  secondary to supply-demand mismatch.  1. MI.  At this time, we will do medical management, treat with Heparin      for a total of 72 hours or longer if her troponin continues to rise or      patient develops chest pain.  Once patient's clinical status is deemed      stable from her sepsis, Metoprolol 25 mg p.o. b.i.d. can be initiated.      Aspirin should be continued.  IV Lasix can be used to diurese patient      once her sepsis has been cleared and she would no longer require IV      fluids.  A transthoracic echo should be obtained to evaluate her EF for      possibility of chronic long term CHF management.  Ideally, patient will      need a heart catheterization as an outpatient.  However, she can not      lie flat secondary to her chronic orthopnea.  In addition, she would      have to be an arm access case which may be logistically difficult.  At      this time, we would consider just medical therapy and re assess in the      coming days.      Reginia Forts, MD  Electronically Signed     RA/MEDQ  D:  07/24/2006  T:  07/24/2006  Job:  [id number]

## 2011-01-15 NOTE — H&P (Signed)
[name], [name]               ACCOUNT NO.:  [id number]   MEDICAL RECORD NO.:  [id number]          PATIENT TYPE:  INP   LOCATION:  A307                          FACILITY:  [facility]   PHYSICIAN:  Marcello Moores, MD   DATE OF BIRTH:  1932-04-17   DATE OF ADMISSION:  11/11/2006  DATE OF DISCHARGE:  LH                              HISTORY & PHYSICAL   PRIMARY CARE PHYSICIAN:  Dr. Lodema Hong.   CHIEF COMPLAINT:  She was presented to Dr. Anthony Sar office with  generalized weakness, shortness of breath and difficulty swallowing with  weight loss and was referred to the emergency room.   HISTORY OF PRESENT ILLNESS:  She is a 77 year old female patient with  significant past medical history with obesity, congestive heart failure,  severe malnutrition with weight loss, diabetes mellitus, and history of  acute renal failure, history of chronic diarrhea. The patient was  admitted on January to [facility] with shortness of breath,  diarrhea and vomiting and as per the patient's will she was transferred  to [facility] and managed there and was discharged improved.  Today she presented to Dr. Anthony Sar office with generalized weakness  and complaints of difficulty breathing and difficulty swallowing for  several days and she was referred to the emergency room. When I  interviewed her, the patient and her daughter said that she had  generalized weakness with difficulty swallowing and chronic loose stools  whenever she took and it was immediately whatever she took  for the last  several weeks and she claimed that she lost more than 100 pounds in the  last 3-4 months. Otherwise she denied any chest pain, no cough, no fever  and she denied abdominal pain, no vomiting, and there was not any  urinary complaints.   REVIEW OF SYSTEMS:  A 10-point review of systems is dictated in the HPI  otherwise noncontributory.   ALLERGIES:  She is also PENICILLIN.   SOCIAL HISTORY:  She lives  with her family at home and she denied  smoking, drinking and drug abuse.   FAMILY HISTORY:  Noncontributory.   PAST MEDICAL HISTORY:  1. Morbid obesity.  2. Type 2 diabetes mellitus.  3. Dyslipidemia.  4. Hypertension.  5. History of congestive heart failure.  6. History of acute renal failure.   HOME MEDICATIONS:  1. Aspirin 81 mg p.o. daily.  2. Protonix 40 mg p.o. daily.  3. Lasix 40 mg p.o. daily.  4. Potassium chloride 20 mEq once a day.  5. Lipitor 10 mg p.o. daily.  6. Requip 1 mg p.o. daily.  7. Xanax 0.25 mg twice daily p.r.n.  8. Metformin 500 mg twice a day.  9. Megace 25 mg p.o. daily.  10.Phenergan 25 mg p.r.n. every 8 h.  11.Combivent 2 puffs 4 times a day p.r.n.  12.Oxygen 2 liters per nasal cannula.  13.She was on Resource nutritional liquid and Beneprotein powder as      well.   PHYSICAL EXAMINATION:  GENERAL:  The patient is lying in the bed without  any respiratory distress, still obese patient.  VITAL  SIGNS:  Blood pressure is 98/64, temperature 97.5 and pulse is 98,  respiratory rate 18 per minute. Saturating 99% on room air.  HEENT:  She has pink conjunctiva, nonicteric sclera. Buccal mucosa is  moist.  NECK:  Supple, no lymphadenopathy.  CHEST:  Good air entry bilaterally. Scattered scrapes on the left lower  lung field.  CVS:  S1, S2 well heard, regular, no murmur.  ABDOMEN:  Obese, no area of tenderness, normal active bowel sounds.  EXTREMITIES:  Trace pedal edema. Peripheral pulse difficult to  appreciate.  CNS:  She is alert and well oriented.  MUSCULOSKELETAL:  There is no abnormality.  GENITOURINARY:  There is no abnormality.  SKIN:  Normal texture and color.   LABORATORY DATA:  White blood cells 16.8, hemoglobin is 12.8, hematocrit  is 38.4 and platelet count is 292. Chemistries, sodium is 137 and  potassium is 2.9, bicarb 19, chloride is 103 and glucose is 93. BUN 7,  creatinine is 0.9. Calcium level is 7.2. Liver function tests is  within  normal range. Total protein 6.5 and albumin is 1.8. Urine is negative  for protein as well as for sugar and there is no sign of infection.   ASSESSMENT:  1. Weight loss, chronic loose stool. This could be secondary to some      sort of irritable bowel syndrome or inflammatory bowel disease and      related problems. Considering the multiple differential diagnosis      for this weight loss and chronic diarrhea I will send stool exam      for C Dif and culture and I will do CAT scan of the abdomen and a      GI consult will be done also by Dr. Cira Servant for further management.  2. Difficulty swallowing. I will put her on pureed diet for now and      she will be evaluated by the speech pathologist for swallow test      and if it is abnormal, I will request neurologist also to help [place]      for further workup and management of the difficulty swallowing.  3. Hypoproteinemia. This could be secondary to pure malnutrition      related to her difficulty swallowing as well but this might be also      related to her GI problem. Will give her high protein  liquid  diet      as long as she tolerates it.  4. History of difficulty breathing and some scattered scrapes on the      examination with elevated white blood cells even though it was      elevated before. I would put her on antibiotic p.o. considering      possibility of early pneumonia.  5. Congestive heart failure compensated, stable currently and since      the blood pressure is on the lower side, I will hold the Lasix for      now.  6. Hypertension. Currently well controlled. The Lasix as well as the      obesity, even though she has lost around 100 pounds still she is      obese and nutrition will be involved to help [place] on her nutritional      intake.  7. Diabetes mellitus. Fairly controlled and she will continue on      Metformin and will monitor also capillary blood glucose.  Finally I discussed with the patient and her daughter  about the  management  of this patient and the possibilities of her problem and the  possible plan of workup.      Marcello Moores, MD  Electronically Signed     MT/MEDQ  D:  11/11/2006  T:  11/12/2006  Job:  [id number]

## 2011-01-15 NOTE — Consult Note (Signed)
[facility]  Patient:    [name], [name] Visit Number: [id number] MRN: [id number]          Service Type: FTC Location: FOOT Attending Physician:  Sharren Bridge Dictated by:   Jonelle Sports Cheryll Cockayne, M.D. Proc. Date: 03/15/02 Admit Date:  03/15/2002   CC:         Mirna Mires, M.D., [place], [place]   Consultation Report  HISTORY:  This 77 year old black female is seen for assistance with management of ulcerations on the posterior lower left leg.  The patient has been morbidly obese for a number of years and in addition has hypertension, cardiac disease, peripheral vascular disease, degenerative arthritis, and a ventral hernia.  She tentatively plans gastric stapling in late September at [facility].  She has recently been placed on Lasix with some improvement in peripheral edema and is working at taking less salt in her diet.  Through the years, she has had trouble with blistering and what sounds like stasis ulcers on her lower extremities.  These have been treated conservatively (never wrapped) and have always resolved; however, over recent weeks she has developed several of these on the posteromedial aspect of the distal left lower leg, which have failed to heal with usual measures.  It is noted that these areas are extremely pruritic and she has in fact been given a prescription for hydrocortisone cream, as well as for Bactroban, to manage them.  She has been using these things covered simply by a Teflon pad.  The ulcers do indeed weep fairly significantly and she reports that at least once in the past she has had cellulitis in that extremity.  PAST MEDICAL HISTORY:  In addition to those things previously mentioned, the patient has medications to include Combivent, Micardis, Univasc, Singulair, ______, Lasix, potassium, Cardura, Celebrex, and the Bactroban and hydrocortisone ointments.  ALLERGIES:  She is said to be allergic to  PENICILLIN.  PAST SURGICAL HISTORY:  She has had no prior surgical procedures.  PHYSICAL EXAMINATION:  EXTREMITIES:  Examination today is limited to the distal lower extremities. The patient is obviously morbidly obese and both legs are significantly edematous, estimated at 2-3+ pitting to well above the knees.  Her skin temperatures are slightly asymmetrical with there being temperature increase on the left compared to the right but these are still well within the normal range and are not suggestive of current active cellulitis.  Peripheral pulses are palpable though somewhat diminished through the edema.  Monofilament testing shows that sensation is clearly intact.  There are multiple plantar calluses on the hallices, the first ______ head areas bilaterally, and the heels.  In addition, there is a callus on the tip of the right third toe and calluses on the dorsolateral aspects of both fifth toes.  On the posteromedial aspect of the distal left lower extremity, just proximal to the ankle joint, are multiple opened areas, the largest of which measures 12 x 8 mm in diameter consistent with stasis ulcerations.  There does not appear to be significant secondary infection.  The insteps of both feet, as well as the medial aspects of the feet, particularly on the heels and the interdigital spaces, are involved with what clearly appears to be a dermatophytosis.  DISPOSITION: 1. The patient is given general instruction regarding footwear by video with    some nurse and physician reinforcement. 2. The calluses are reduced by dremelling by the nurse and two of these by the    physician, namely that on  the dorsolateral aspect of the left fifth toe and    at the tip of the right third toe. 3. The ulcerated areas on the left lower extremity are coated with Cleocin T    and this is overlain then with ______ and an absorptive Sofsorb pad and    that extremity then wrapped in traditional Profore  wrap. 4. The patient is given a prescription for Lamisil tablets 250 mg and is    instructed to take these one a day for a week, to pause for three weeks,    then resume that pattern of cycling for a total of eight cycles of therapy. 5. The patient is advised to get Lamisil cream over-the-counter and to begin    using this on the involved areas of the right foot with the anticipation of    doing the same thing on the left foot once we have healed the ulcers and    she is out of compressive wrap. 6. Followup visit will be to this clinic in one week to the R.N. and two weeks    to the physician. Dictated by:   Jonelle Sports Cheryll Cockayne, M.D. Attending Physician:  Sharren Bridge DD:  03/15/02 TD:  03/19/02 Job: [id number]

## 2011-01-15 NOTE — Consult Note (Signed)
NAMEJAILINE, [name]               ACCOUNT NO.:  [id number]   MEDICAL RECORD NO.:  [id number]          PATIENT TYPE:  OBV   LOCATION:  A415                          FACILITY:  [facility]   PHYSICIAN:  Tilda Burrow, M.D. DATE OF BIRTH:  1932/04/29   DATE OF CONSULTATION:  10/15/2006  DATE OF DISCHARGE:                                 CONSULTATION   REASON FOR CONSULTATION:  Postmenopausal vaginal bleeding.   HISTORY OF PRESENT ILLNESS:  The patient is a 77 year old female patient  at [facility], referred to [facility] ER and seen in labor and  delivery in order to utilize the delivery bed for exam, is seen for  vaginal bleeding of several months duration.   This 77 year old female has not had a GYN exam in over 40 years.  Has  been an inpatient at [facility] from leg weakness and  inflexibility after a recent hospitalization for congestive heart  failure, pneumonia and possibly myocardial infarction at [facility].  She has not been home since October.  She has had vaginal  bleeding off and on for three months with no fever, chills, nausea,  vomiting, or diarrhea.  GI history is notable for esophageal stricture.  She describes multiple upper and lower endoscopies.  Medical record  referred from Dr. Lodema Hong indicates that she has had recent  hospitalization, January 3 to September 07, 2006 where her problem list  included nausea and vomiting, diarrhea which has subsequently resolved,  acute renal failure which is suspected to be due to over diuresis,  history of chronic congestive heart failure, protein malnutrition, and  diabetes mellitus.   PAST SURGICAL HISTORY:  Ventral hernia only.   ALLERGIES:  None known.   HABITS:  Negative for alcohol, recreational drugs.   MEDICATIONS:  See accompanying [facility] list which includes:  1. Aspirin 81 mg per day.  2. Multivitamin liquid 10 mL daily.  3. Lasix 40 mg daily.  4. Klor 20 mEq one daily.  5. Megace 20 mg tablet  two tablets orally daily.  6. Omeprazole (Prilosec) 40 mg p.o. daily.  7. Metformin 500 mg p.o. daily.  8. Simvastatin 20 mg daily.  9. Requip 1 mg two tablets at bedtime.  10.Xanax 0.25 mg b.i.d. p.r.n.  11.Combivent inhaler two puffs four times a day p.r.n.  12.Promethazine 25 mg p.o. daily p.r.n.  13.Prostat 80 mL by mouth three times a day for malnutrition.   PHYSICAL EXAMINATION:  GENERAL:  An alert, oriented, morbidly obese  African American female.  VITAL SIGNS:  Weight by patient history 300 pounds.  Recent hemoglobin  11, hematocrit 34, white count 9800.  ABDOMEN:  Nontender, morbidly obese abdomen with huge panniculus.  EXTREMITIES:  Leg mobility is adequate for GYN exam.  SPECULUM EXAM:  Mucoid, bloody discharge coming from the cervix.  There  is no lesions on the cervix or uterus.  GC and Chlamydia cultures are  performed.  Pap smear not able to be performed at this time secondary to  purulence and discharge.  Uterus sounds to 10 cm.  Endometrial biopsy  contains a bloody,  mucoid, questionably purulent debris consistent with  wide differential diagnoses including cancer or infection.  Culture of  endometrial sampling performed as well as pathology specimen.   IMPRESSION:  Postmenopausal bleeding secondary to endometrial pathology,  endometrial cancer versus infection versus endometrial polyp or fibroid.   PLAN:  Add doxycycline 100 mg b.i.d. for seven days as prophylaxis.  Followup pathology report next week.  Subsequent treatment based on  pathology results.   ADDRESS:  Level 5, greater than one hour spent in evaluation,  examination and documentation.      Tilda Burrow, M.D.  Electronically Signed     JVF/MEDQ  D:  10/15/2006  T:  10/15/2006  Job:  [id number]

## 2011-01-15 NOTE — H&P (Signed)
NAME:  [name], [name]                         ACCOUNT NO.:  [id number]   MEDICAL RECORD NO.:  [id number]                   PATIENT TYPE:  AMB   LOCATION:  DAY                                  FACILITY:  [facility]   PHYSICIAN:  Jerolyn Shin C. Katrinka Blazing, M.D.                DATE OF BIRTH:  1931/10/25   DATE OF ADMISSION:  DATE OF DISCHARGE:                                HISTORY & PHYSICAL   HISTORY OF PRESENT ILLNESS:  [age]-year-old female with a history of large  ventral incisional hernia.  The patient presented in early May with  incarceration and pain in the hernia site.  She is morbidly obese and it was  felt that since the patient was not obstructed, that she should have a  cardiac workup.  Echocardiogram was done and this was normal with normal LV  function and no wall abnormalities.  Stress test was done and this reveals  no inducible ischemia.  In the interim, the hernia has become larger and she  has ulceration of the skin over the hernia site.  She is now scheduled for  incisional hernia repair.   PAST HISTORY:  She has:  1. Gastroesophageal reflux disease.  2. Hypertension.  3. Osteoarthritis.  4. Chronic venous insufficiency.  5. Diabetes mellitus.   MEDICATIONS:  1. Univasc 15 mg daily.  2. Crestor 10 mg daily.  3. Aciphex 20 mg daily.  4. Motrin 800 mg b.i.d.  5. Glucophage XR 500 mg in the morning and 250 mg in the evening.  6. Potassium chloride 20 mEq daily.  7. Verelan 200 mg at h.s.  8. Micardis 80/12.5 mg 1 daily.   PHYSICAL EXAMINATION:  GENERAL:  On examination, she is morbidly obese,  weight 393 pounds.  VITAL SIGNS:  Blood pressure 130/80, pulse of 92, respirations 28, oxygen  saturation is 97% on room air.  HEENT:  HEENT is unremarkable except for some dental cavities.  NECK:  Neck is supple, no JVD or bruit.  CHEST:  Chest clear to auscultation with no rubs, rales, rhonchi or wheezes.  HEART:  Heart regular rate and rhythm without murmur, gallop or rub.  ABDOMEN:  Abdomen massively obese.  Very large, tender hernia with erosion  of the skin over the hernia site.  EXTREMITIES:  Chronic venous stasis changes with stasis dermatitis, no  ulcerations, 2+ edema bilaterally.  NEUROLOGIC:  No focal motor, sensory or cerebellar deficit.   IMPRESSION:  1. Incisional hernia with incarceration and erosion of skin.  2. Hypertension.  3. Degenerative joint disease.  4. Diabetes mellitus.  5. Chronic venous insufficiency.  6. Gastroesophageal reflux disease.  7. Morbid obesity.   PLAN:  Repair of incisional hernia with probable mesh graft.     ___________________________________________  Dirk Dress. Katrinka Blazing, M.D.   LCS/MEDQ  D:  02/06/2004  T:  02/07/2004  Job:  [id number]   cc:   Milus Mallick. Lodema Hong, M.D.  [address]  [place], [place] [address]  Fax: [phone]

## 2011-01-15 NOTE — Procedures (Signed)
   NAME:  [name], [name]                         ACCOUNT NO.:  [id number]   MEDICAL RECORD NO.:  [id number]                   PATIENT TYPE:  INP   LOCATION:  A312                                 FACILITY:  [facility]   PHYSICIAN:  Dirk Dress. Katrinka Blazing, M.D.                DATE OF BIRTH:  10-May-1932   DATE OF PROCEDURE:  DATE OF DISCHARGE:                                  ECHOCARDIOGRAM   The echocardiogram is markedly limited secondary to obesity.   INDICATIONS:  Congestive heart failure.  428.0   CONCLUSIONS:  1. Mild-to-moderate left atrial enlargement.  2. No overall abnormalities of the left ventricle.  It is very difficult to     see the ventricular endocardium; however, it appears that there is normal     systolic function grossly.  3. Mild left ventricular hypertrophy.  4. No important valvular abnormalities.  5. Normal right-sided structures and function.     Thomas C. Wall, M.D.                      Dirk Dress. Katrinka Blazing, M.D.    TCW/MEDQ  D:  05/13/2003  T:  05/13/2003  Job:  [id number]   cc:   Dirk Dress. Katrinka Blazing, M.D.  [address]  [place]  [place] [address]  Fax: [phone]

## 2011-01-15 NOTE — Procedures (Signed)
NAMEPRISCILLE, SHADDUCK               ACCOUNT NO.:  [id number]   MEDICAL RECORD NO.:  [id number]          PATIENT TYPE:  INP   LOCATION:  IC07                          FACILITY:  [facility]   PHYSICIAN:  Edward L. Juanetta Gosling, M.D.DATE OF BIRTH:  [birth year]   DATE OF PROCEDURE:  DATE OF DISCHARGE:                              EKG INTERPRETATION   The time is 11:37 on August 05, 2006.  The rhythm is sinus tachycardia  with a rate of about 120.  There is biatrial enlargement.  The axis is  rightward but not right axis deviation.  Q waves are seen across the  precordium suggesting a previous anterior infarction and clinical  correlation is suggested.   Abnormal electrocardiogram.      Oneal Deputy. Juanetta Gosling, M.D.  Electronically Signed     ELH/MEDQ  D:  08/06/2006  T:  08/06/2006  Job:  [id number]

## 2011-01-15 NOTE — Discharge Summary (Signed)
[name], [name]               ACCOUNT NO.:  [id number]   MEDICAL RECORD NO.:  [id number]          PATIENT TYPE:  INP   LOCATION:  IC07                          FACILITY:  [facility]   PHYSICIAN:  Mobolaji B. Bakare, M.D.DATE OF BIRTH:  23-Nov-1931   DATE OF ADMISSION:  08/05/2006  DATE OF DISCHARGE:  12/10/2007LH                               DISCHARGE SUMMARY   PRIMARY CARE PHYSICIAN:  Dr. Syliva Overman.   REASON FOR TRANSFER:  Acute respiratory failure.   DISCHARGE DIAGNOSES:  1. Acute respiratory failure.  2. Persistent right lower lobe pneumonia.  3. Elevated troponin.  Recent cardiac catheterization showed no      occlusive disease.  4. Septic shock.  5. Acute renal failure; admission creatinine was 2.0; transfer      creatinine was 1.1.  6. Takotsubo's cardiomyopathy.  7. Diabetes mellitus, type 2.   PROCEDURES:  1. Chest x-ray done on the 7th of December showed persistent airspace      disease in the left lower lobe, obscuring the left hemidiaphragm,      cannot exclude pneumonia.  There is mild cardiomegaly with      pulmonary venous hypertension and borderline interstitial edema.  2. Lower extremity Dopplers showed no deep venous thrombosis.  3. Endotracheal intubation on the 10th of December 2007.   BRIEF HISTORY:  [name] is a pleasant 77 year old African  American female who was recently discharged from critical care unit at  [facility] on the 28th of November 2007.  During that hospitalization,  she was treated for right lower lobe pneumonia, hypotension, non-ST-  elevation myocardial infarction.  She had a cardiac catheterization  which showed no evidence of culprit lesion, CHF, cardiomyopathy thought  to be Takotsubo's cardiomyopathy.  She was much improved at the time of  discharge on the 1st of December 2007.  She continued to improve at home  and was taking her medications as prescribed.  She went for a followup  doctor's appointment on the 7th  of December and while in the waiting  room, she developed chills, but temperature was normal and it was  associated with shortness of breath; hence, she was brought to [facility] Emergency Room by the EMS.   PHYSICAL EXAMINATION:  Initial evaluation revealed temperature of 97.4,  O2 SATs of 94% on oxygen, blood pressure of 95/62 and tachycardic with a  pulse rate of 120; respiratory rate was 24.  Physical examination was  remarkable for an obese African American lady who was very anxious, but  not in acute distress.  Lungs had crackles at the lung bases, more on  the right than the left.  There was no wheeze or rhonchi.  The rest of  the physical examination was unremarkable.   INITIAL LABORATORY DATA:  White cell count of 9.2, hemoglobin of 14.2  and platelets of 356,000.  She had a BNP of 104 and liver transaminases  were normal.  D-dimer was 3.77.   Chest x-ray was suspicious for persistent pneumonia and borderline  interstitial edema.   [name] was subsequently admitted to the intensive  care unit.   HOSPITAL COURSE:  While in the intensive care unit, she became  hypotensive after 2-3 hours of admission into the unit.  The patient was  bolused with IV fluid; she received about 2500 mL without good  improvement and she was started on dopamine.  She was empirically  covered with full-dose anticoagulation (Lovenox) because of suspicion of  pulmonary embolism.  We could not obtain a CT scan due to high  creatinine  and the patient was unstable for V/Q scan at that time.  She  was covered with IV cefepime.  The patient remained afebrile until  tonight, when she developed chills.  She had an oral temperature of 98.7  and a rectal temperature of 103.8.  Her white cell count jumped from 9.2  to 29.4 within 24 hours of admission.  Next couple of days, the patient  was making slow progress from shortness of breath standpoint.  She  required 3 L of nasal cannula and she was saturating  O2 at 97% to 99%.  Approximately about 11:30 p.m. on the 9th of December, the patient had a  sudden onset of shortness of breath associated with chills and rectal  temperature of 103.8.  Her work of breathing was high with a respiratory  rate of 36-40.  The patient was tachycardic with a heart rate of 140-  150.  Blood pressure was about 120/70 systolic.  She had an ABG which  showed pH of 7.30, PCO2 of 43, PO2 of 26, base excess of 20.4 on 8 L.  Decision was made to intubate this patient and ventilate.  She was given  25 mg of propofol and 10 mg of Versed pre-intubation.  Three attempts by  respiratory therapist to intubate were unsuccessful secondary to the  patient's body habitus.  ER physician was able to intubate without  difficulty.  Blood pressure dropped to 67/40 post intubation.  The  patient was bolused with IV fluid; there was minimal response.  She was  started on 5 mcg of dopamine, which was subsequently changed to Neo-  Synephrine due to increasing tachycardia.   The patient was discussed with physician on call at [facility].  She will be  transferred to [facility] for further management.  Two sets of blood  cultures have been drawn.  The patient was started on vancomycin.  She  received 1500 mg of vancomycin.  During the course of acute respiratory  distress, she received 40 mg of Lasix and morphine 2 mg IV.  She was  tried on BiPAP.  The patient was very anxious and somewhat  claustrophobic and could not tolerate BiPAP; hence, she was intubated.   Post-intubation blood gas was pH 7.15, PCO2 62, PO2 82 and O2 SATs of  93% on 100% FIO2 (ventilator setting was assist control, respiratory  rate of 12, tidal volume of 500, peak flow of 60 and FIO2 of 1).  Ventilator setting was subsequently changed to respiratory rate of 16,  tidal volume of 600 and peak flow 70.  Repeat blood gas has not been  done.  Current vitals at the time of this dictation were heart rate of 147,   respiratory rate of 19, blood pressure 84/34 and O2 SATs of 98% on FIO2  of 100%.  The patient is currently on Neo-Synephrine 100 mcg, dopamine  at 10 mcg and IV fluids.   Blood cultures have been drawn.  We are planning to put in a central  line prior to transfer  and possibly an A line.   DISCHARGE CONDITION:  As mentioned above.   DISPOSITION:  The patient is transferred to [facility].      Mobolaji B. Corky Downs, M.D.  Electronically Signed     MBB/MEDQ  D:  08/08/2006  T:  08/08/2006  Job:  [id number]   cc:   Milus Mallick. Lodema Hong, M.D.  Fax: [phone]

## 2011-01-15 NOTE — Discharge Summary (Signed)
NAMEILISA, [name]               ACCOUNT NO.:  [id number]   MEDICAL RECORD NO.:  [id number]          PATIENT TYPE:  INP   LOCATION:  IC07                          FACILITY:  [facility]   PHYSICIAN:  Margaretmary Dys, M.D.DATE OF BIRTH:  1932-04-28   DATE OF ADMISSION:  07/22/2006  DATE OF DISCHARGE:  11/24/2007LH                                 DISCHARGE SUMMARY   DISCHARGE DIAGNOSES:  1. Septic shock.  2. Hypotension.  3. Morbid obesity with a weight of over 400 pounds.  4. Acute renal failure likely related to septic shock.  5. Elevated troponin, possible demand ischemia versus myocardial      infarction.   DISPOSITION:  Patient has been transferred to [facility]  Intensive Care Unit for further management.   HOSPITAL COURSE:  [name] is a 77 year old African-American female who  was admitted to the hospital on July 22, 2006, via the emergency room.  Please refer to Dr. Orlie Dakin History and Physical.  Patient apparently had  been to the emergency room a couple of times prior to this visit complaining  of shortness of breath.  She was evaluated with a chest x-ray at the time  which was unremarkable.  A D-dimer was negative.  She had a VQ scan which  was said to be negative for pulmonary embolism.   She presented this time complaining of fever.  Patient had a temperature of  104.8 with chills.  A chest x-ray revealed probable right lung pneumonia.  Patient had significant leukocytosis with bandemia.  Cardiac enzymes were  also noted to be elevated.  Patient denies any chest pain but felt fairly  fatigued.   An EKG reveals some evidence of some sinus tachycardia without any ST  elevation.  Patient was discussed with the cardiologist on call at [facility].  An attempt was made to transfer her to a higher level of  care, but there was no bed available.  As a result, patient was admitted  here at [facility].  Due to her septic picture,  patient was started  on vancomycin and Primaxin.  Levaquin was added the next day.  She was also  fairly hypertensive and nitroglycerin was discontinued after 30 mg Levophed  was started.  She initially received 1 g of ceftriaxone and Zithromax p.o.  with some chewable aspirin.  A heparin infusion was started due to elevated  cardiac enzymes.   During the course of hospitalization patient's temperature improved down to  97.8 the next day.  White counts have increased to 35,000 with persistent  bandemia.  Blood pressure improved from 80 systolic to 130/70 with a MAP of  80.   Her cardiac enzymes continued to worsen.   Due to the multiple end-organ damage the patient has, including the high  refractory hypertension, the acute renal failure, a decision was made to  continue to keep her on the transfer list to [facility]  for further evaluation.  Patient eventually had a bed late Saturday,  July 23, 2006.  Patient was transferred in a more stable state  though  she remained on Levophed at 8 mcg.   PERTINENT LABORATORY DATA ON ADMISSION:  Her BNP was less than 30, sodium  was 135, potassium 3.5, chloride of 100, CO2 was 20, glucose 179, BUN of 15,  creatinine was 1.3, calcium was 8.8, white blood cell count was 15.6,  hemoglobin 14.4, hematocrit 44.3, platelet count was 319,000.  Neutrophils  were 49%, lymphocytes 20%, bands were greater than 20%.  Urinalysis:  Urine  specific gravity was greater than 1.030 , no nitrites or leukocytes.  Chest  x-ray shows possible right lung pneumonia with borderline cardiomegaly.  A  lactic acid level was 3.1.  Blood gas showed a pH of 7.37, pCO2 of 30.1,  bicarbonate 17.7, pO2 of 94.7, oxygen saturation was 97.1%, this was on 3L  of oxygen.   DISPOSITION:  Patient is being transferred to [facility].   PERTINENT PHYSICAL EXAM:  Other than being morbidly obese and her blood  pressure being 80/42, pulse was 123, oxygen  saturation was 97% on 3L,  temperature was 104.8, patient was dyspneic with a rate of about 20.  LUNGS:  Revealed reduced air entry with no crackles, wheezing or rhonchi.  EXTREMITIES:  Chronic venous stasis dermatitis.  Rest of exam was unremarkable.   Please note that I discussed extensively with patient's children and  relatives, some of them physicians on her status and the importance of being  transferred.      Margaretmary Dys, M.D.  Electronically Signed     AM/MEDQ  D:  07/24/2006  T:  07/24/2006  Job:  [id number]   cc:   Milus Mallick. Lodema Hong, M.D.  Fax: [phone]

## 2011-01-15 NOTE — Consult Note (Signed)
[name], [name]               ACCOUNT NO.:  [id number]   MEDICAL RECORD NO.:  [id number]          PATIENT TYPE:  INP   LOCATION:  A307                          FACILITY:  [facility]   PHYSICIAN:  Kassie Mends, M.D.      DATE OF BIRTH:  12/08/1931   DATE OF CONSULTATION:  11/12/2006  DATE OF DISCHARGE:                                 CONSULTATION   REASON FOR CONSULTATION:  Weight loss, diarrhea, and dysphagia.   PRIMARY CARE PHYSICIAN:  Milus Mallick. Lodema Hong, M.D.   HISTORY OF PRESENT ILLNESS:  [name] is a 77 year old female with a  past medical history of oropharyngeal dysphagia diagnosed by modified  barium swallow in February of 2008.  She has had difficulty swallowing  since November of 2007.  She reports 100 pound weight loss due to  difficulty swallowing.  She had an EGD in February of 2008 performed by  Dr. Darleen Crocker which showed an esophageal stricture.  She was dilated  to 18 mm.  According to the notes, she did not have any improvement in  her difficulty swallowing.  The patient confirms that her swallowing did  not improve after her esophageal dilation.  She states she can swallow  water, pills, potatoes with gravy, and soup.  She states she is sick of  soup.  She has been unable to eat solid foods for three months.  She  does not recall ever seeing a neurologist.  She has basically been able  to eat the juice from turnip greens and beans.  She also drinks two cans  of Ensure a day.  She states that everything she eats causes her to have  a watery stool.  She has never had a colonoscopy.  She denies blood in  her stool, black tarry stools, fever, abdominal pain, or heartburn.  She  does not have well water and has not traveled outside the [place].  She has been in and out of the hospital since November of 2007.  She has  been hospitalized five times.  She was also in rehabilitation at [facility].  She states she has at least two watery stools a day.  The number of  stools she has a day depends on how many times she eats.  She states she  eats and 15 minutes later she has a watery stool.  The last formed stool  she had was in November.  She is able to drink through a straw.  She  reports being nauseated all the time.  She states that she does try to  eat food and tries to force it down with water and the food bubbles back  up.  She never really retches or vomits.   PAST MEDICAL HISTORY:  1. Diabetes.  2. Hyperlipidemia.  3. Hypertension.  4. History of congestive heart failure, ejection fraction of 67% on      stress Cardiolite scan in June of 2005.   ALLERGIES:  PENICILLIN.   MEDICATIONS:  1. Aspirin 81 mg daily.  2. Zithromax 500 mg daily.  3. Lovenox 40 mg q.24h.  4. Levaquin  500 mg daily.  5. Metformin 500 mg b.i.d.  6. Protonix 40 mg daily.  7. K-Dur 20 mEq daily.  8. Zocor 20 mg daily.  9. Ambien 5 mg q.h.s.  10.Phenergan 25 mg as needed.   FAMILY HISTORY:  [name] has no family history of diarrheal  illnesses, liver disease, colon cancer, colon polyps, uterine, ovarian  or breast cancer.   SOCIAL HISTORY:  She lives with her family.  She has three children.  She denies any tobacco, alcohol, or drug abuse.   REVIEW OF SYSTEMS:  Per the HPI; otherwise, all systems are negative.   PHYSICAL EXAMINATION:  VITAL SIGNS:  Temperature 97.8, blood pressure  96/64, pulse 95.  Fingersticks 99.  GENERAL:  She is in no apparent distress, alert and oriented x4.  HEENT:  Atraumatic, normocephalic.  Pupils are equal, round, and  reactive to light.  Mouth with no oral lesions.  Posterior oropharynx  without erythema or exudate.  Poor dentition.  NECK:  Full range of motion, no lymphadenopathy.  LUNGS:  Clear to auscultation bilaterally.  The bases have decreased  breath sounds bilaterally.  CARDIOVASCULAR:  Regular rhythm, no murmur.  Normal S1 and S2.  ABDOMEN:  Obese, soft, nontender, nondistended.  Bowel sounds present.  No rebound or  guarding.  Unable to appreciate hepatosplenomegaly.  EXTREMITIES:  Without clubbing, cyanosis, or edema.  She has  hyperpigmentation of the skin bilaterally.  NEUROLOGIC:  She has no focal neurologic deficits.   LABORATORY DATA:  White count 16.8, hemoglobin 12.8 on November 11, 2006.  On November 12, 2006, white count 18, hemoglobin 10.9, platelets 256.  Potassium 3.2.  Albumin 1.8, total protein 6.5, calcium 6.7.  UA  negative for protein.  AST 31, ALT 17.   CT scan performed on November 11, 2006 showed fatty liver, distended  gallbladder, and uterine fibroid.   ASSESSMENT:  [name] is a 77 year old female with oropharyngeal  dysphagia per speech pathology and no known neurologic workup.  The  etiology for her oropharyngeal dysphagia is unclear.  It is associated  with profound weight loss and hypoalbuminemia.  She has a history of  esophageal stricture with dilation to 18 mm which should be adequate.  She is able to tolerate Ensure and take pills.  She also has watery  diarrhea.  The differential diagnoses for her postprandial watery  diarrhea includes celiac sprue, small bowel bacterial overgrowth,  microscopic colitis, and a low likelihood of infectious diarrhea.   Thank you for allowing me to see [name] in consultation.  My  recommendations follow.   RECOMMENDATIONS:  1. Recommend neurology consult to further evaluation her oropharyngeal      dysphagia.  2. Would obtain the speech pathology consult from the modified barium      swallow that was performed on October 17, 2006 in [place].  3. Will perform EGD and colonoscopy on November 14, 2006 once IV access      is established.  4. Would add Flagyl to antibiotic coverage to treat small bowel      bacterial overgrowth in an attempt to improve the watery stools.  5. When the EGD is performed, biopsies will be performed of the small      bowel to evaluate for celiac sprue.  The colonoscopy will be     performed to address  anemia and to biopsy for microscopic colitis.  6. Will check a B12 and a ferritin.  7. Agree with stool studies and would add fecal  lactoferrin as well as      Giardia antigen.  8. Clear liquid diet on November 13, 2006 and would add Ensure four times      a day with protein powder in each can of Ensure.  9. Would obtain double-lumen PICC line.  10.Add Imodium 2 mg three times a day.      Kassie Mends, M.D.  Electronically Signed     SM/MEDQ  D:  11/12/2006  T:  11/13/2006  Job:  [id number]   cc:   Milus Mallick. Lodema Hong, M.D.  Fax: [phone]

## 2011-01-15 NOTE — H&P (Signed)
[name], PANIK               ACCOUNT NO.:  [id number]   MEDICAL RECORD NO.:  [id number]          PATIENT TYPE:  INP   LOCATION:  2103                         FACILITY:  [facility]   PHYSICIAN:  Charlcie Cradle. Delford Field, MD, FCCPDATE OF BIRTH:  [birth year] 04, 1931   DATE OF ADMISSION:  07/23/2006  DATE OF DISCHARGE:                              HISTORY & PHYSICAL   CHIEF COMPLAINT:  Shortness of breath.   HISTORY OF PRESENT ILLNESS:  A 77 year old [place] American female, who  had previously been seen in the emergency room on July 15, 2006,  complaining of dyspnea.  Their workup at that time showed vascular  congestion, no edema, D-dimer elevated, and V/Q scan was negative.  She  was treated for anxiety, sent home, and seen by Dr. Syliva Overman on  July 19, 2006.  She was given a course of Levaquin at that time.  She was taking this at 750 mg daily for 4 days, was somewhat better, but  then at 3:00 p.m. on July 22, 2006, developed progressive increased  dyspnea sudden in onset.  She was brought to the emergency room with a  temperature of 104.8 and had chills with this.  A chest x-ray showed a  right lower lobe infiltrate.  She had leukocytosis with bandemia and  also positive cardiac markers.  Initially, she was admitted to the ICU  at [facility], as there were no beds at [facility] or at [facility] in  the ICUs.  She was hypotensive and nitroglycerin was stopped.  She was  given volume resuscitation and a heparin drip.  She has transferred now  to [facility] for further specialist consultation today.   PAST MEDICAL HISTORY:  1. Type 2 diabetes.  2. Hyperlipidemia.  3. Hypertension.  4. Chronic venostasis.  5. Osteoarthritis.  6. Gastroesophageal reflux disease.  7. Previous hiatal hernia surgery.   MEDICATIONS PRIOR TO ADMISSION:  1. Ambien 12.5 mg h.s.  2. Verapamil 200 mg daily.  3. Metformin 500 mg b.i.d.  4. Potassium 20 mEq daily.  5. Lipitor 10 mg daily.  6. Lasix 80 mg  daily.  7. Micardis 80 mg daily.  8. Darvocet p.r.n.  9. Hydroxyzine p.r.n.  10.Levaquin was daily 750 mg.  11.Benzonatate 200 mg t.i.d.  12.Combivent p.r.n.  13.Requip 15 mg h.s.  14.Hyoscyamine daily.   ALLERGIES:  PENICILLIN CAUSES HIVES.   FAMILY HISTORY:  Noncontributory.   SOCIAL HISTORY:  The patient resides with family.  She does not smoke or  drink alcohol.  She used to smoke many years ago, but quit.   PHYSICAL EXAMINATION:  VITAL SIGNS:  Blood pressure 120/70, pulse 100  sinus tach, respirations 15-20, temp 98.  RESPIRATORY:  Chest showed rhonchi, poor air flow, a few expiratory  wheezes, and rales right lower lobe.  CARDIAC:  Showed a resting tachycardia without S3.  Normal S1, S2.  Heart tones are distant.  ABDOMEN:  Protuberant.  There was marked anasarca and edema in the lower  extremity with a marked pannus in the lower abdominal area.  Chronic  venostasis changes in the lower extremity seen with  mild edema.  NEUROLOGIC:  The patient is awake, alert, and in no distress.  SKIN:  Otherwise clear, except for venostasis changes in the lower  extremities.  HEENT:  Oropharynx clear.  NECK:  Supple.  The patient mildly diaphoretic.   LABORATORY DATA:  Prior to transfer included blood gas on 3 liters pH  7.38, PCO2, PO2 95.  White count 34,000 and was 15,000 yesterday.  Hemoglobin 13.1 and platelet count 294.  The patient has been given  hydrocortisone, however.  Sodium 133, potassium 3.8, chloride 102, CO2  18, BUN 18, creatinine 1.5, blood sugar 172, bilirubin 1, alk phos 95,  AST 68, ALT 50, albumin 2.3, lactic acid 3.1.  CK-MB 96 and troponin  14.5.  Urinalysis was unremarkable.  Chest x-ray showed a right lower  lobe infiltrate.  EKG showed widened complexes, J-point elevation, no  acute ST-T wave changes, and sinus tachycardia.   IMPRESSION:  1. Right lower lobe pneumonia with history of septic shock.  2. Positive cardiac enzymes suggesting non-ST segment  myocardial      infarction.  3. Hypoxic respiratory failure.  4. Morbid obesity.  5. Acute on chronic renal insufficiency.  6. Previous history of hypertension.  7. Diabetes.  8. Lactic acidosis.   RECOMMENDATIONS:  1. Switch imipenem to IV Rocephin.  2. Continue IV vancomycin.  3. Begin IV Zithromax.  4. Check strep and Legionella urine antigen.  5. Cardiac consultation.  6. Check echo.  7. Give 1 dose of Lasix.  8. Recycle enzymes.  9. Discontinue vasopressors.  10.Place triple lumen PICC line.  11.Continue IV heparin.  12.Continue aspirin.  13.Give hyperglycemia protocol.  14.Administer nebulizer treatments.  15.Follow up lab data.      Charlcie Cradle Delford Field, MD, [facility]  Electronically Signed     PEW/MEDQ  D:  07/23/2006  T:  07/23/2006  Job:  [id number]   cc:   Milus Mallick. Lodema Hong, M.D.

## 2011-01-15 NOTE — Discharge Summary (Signed)
NAMELELAINA, OATIS               ACCOUNT NO.:  [id number]   MEDICAL RECORD NO.:  [id number]          PATIENT TYPE:  INP   LOCATION:  A330                          FACILITY:  [facility]   PHYSICIAN:  Osvaldo Shipper, MD     DATE OF BIRTH:  18-Nov-1931   DATE OF ADMISSION:  08/29/2006  DATE OF DISCHARGE:  LH                               DISCHARGE SUMMARY   TRANSFER SUMMARY.   Patient is being transferred to [facility] per family  request.   The patient's primary care physician is Dr. Syliva Overman.   Please note patient was recently discharged from [facility] on  August 22, 2006, after being treated for respiratory failure secondary  to pneumonia and/or CHF.   Please review the H&P dictated by Dr. Sherle Poe for details regarding  patient's presenting illness for her current hospitalization.   DISCHARGE TRANSFER DIAGNOSES:  1. Diarrhea with nausea and vomiting and abdominal pain, rule out      Clostridium difficile, possible gastroenteritis.  2. Morbid obesity.  3. Hypertension with transient hypotension requiring holding of blood      pressure medication.  4. Anemia, stable.  5. Leukocytosis of unclear etiology, possibly related to #1.  6. Acute on chronic renal failure, improved.   BRIEF HOSPITAL COURSE:  Briefly, this is a 77 year old African-American  female who was discharged from [facility] on December 24th  after being treated for acute respiratory failure which was thought to  be either secondary to pneumonia and there was also a concern whether  she had pulmonary edema.  Patient at that time was actually initially  admitted to [facility] and had to be transferred to [facility]  because of her respiratory failure.   Patient presented this time with a 2 day history of nausea, vomiting and  diarrhea.  She was also having some abdominal pain.  The patient  underwent a CAT scan of the abdomen and pelvis in the ED which was done  without contrast because of renal failure.  However, it showed no  specific abnormalities in the abdomen which would cause her symptoms.  Stool studies were ordered, however, but not done so far.  She continued  to have diarrhea about 4-5 per day.  She was also having some nausea and  emesis with bilious material.  Her abdominal pain had also not improved  in the past few days.  The patient was empirically started on IV Cipro  and Flagyl.   It was also noted that her blood pressure was running low; hence,  antihypertensive agents were held and the blood pressure recovered  nicely.   This morning patient and her family were quite upset that the patient  had not shown improvement in the past 4 days that she had been in the  hospital.  I tried to explain to them that we are still waiting on  studies and that her medications may be changed over to provide better  control of her symptoms.  However, the patient and her family wanted the  patient to be transferred to [facility]  where they think that they  would get better care.  In view of this demand, I spoke to [facility]  admitting physician at [facility].  She was admitted to [facility] the  last time she was there.  I spoke with Dr. Gasper Sells who has accepted  the patient in transfer to [facility].  Arrangements have been made and  the patient will be transferred to a private room at [facility].   MEDICATIONS THAT SHE IS ON CURRENTLY:  Include the following:  1. Aspirin full dose, 325 daily.  2. Cipro IV.  3. Flagyl IV.  4. Lovenox DVT prophylactic dose.  5. Zocor.  6. Xanax.  7. Dilaudid p.r.n.  8. Reglan.  9. Zofran.  10.Tylox.  11.Her blood pressure pills including Avapro and Lopressor are being      held at this time.   At home she is also on:  1. Lasix, which is being held.  2. She is on metformin, which was held.  3. Micardis was held.  4. Combivent.  5. Requip.  6. Home O2.   Further disposition and other  follow up will be decided when the patient  is discharged from [facility].   TOTAL TIME SPENT AT DISCHARGE:  About 35 minutes.      Osvaldo Shipper, MD  Electronically Signed     GK/MEDQ  D:  09/01/2006  T:  09/01/2006  Job:  [id number]

## 2011-01-15 NOTE — Discharge Summary (Signed)
[name], [name]               ACCOUNT NO.:  [id number]   MEDICAL RECORD NO.:  [id number]          PATIENT TYPE:  INP   LOCATION:  3734                         FACILITY:  [facility]   PHYSICIAN:  Madaline Savage, MD        DATE OF BIRTH:  Nov 24, 1931   DATE OF ADMISSION:  08/08/2006  DATE OF DISCHARGE:                               DISCHARGE SUMMARY   PRIMARY CARE PHYSICIAN:  Milus Mallick. Lodema Hong, M.D., [facility]   CONSULTATIONS:  1. Seen by [facility] while in intensive care unit.  2. Seen by [facility] Cardiology while in the hospital.   PROCEDURES IN HOSPITAL:  1. She had lower extremity Doppler's done on August 05, 2006 while      showed no deep venous thrombosis.  2. She had an ultrasound of the pelvis done on August 15, 2006 which      was nondiagnostic.  3. She had 2-dimensional echocardiogram done on August 08, 2006      which showed no significant interval change since August 24, 2006.  Left ventricular ejection fraction was estimated to be 50%.   HISTORY OF PRESENT ILLNESS:  [name] is a 77 year old morbidly obese  African-American female with multiple medical problems including  diabetes, hypertension and recent admission for sepsis pneumonia, who  came to the hospital with complaints of fever and chills.  Her initial  evaluation showed that she had bilateral crackles and her x-ray on  admission showed possible cardiac enlargement.   HOSPITAL COURSE:  She was admitted with the diagnosis of possible  pneumonia and an acute renal failure with a creatinine of 2.  The  patient initially was then transferred to [facility] and was  admitted to the ICU for her pneumonia and was discharged back to floor  on August 13, 2006 on BiPAP mask, but, on the next day, August 14, 2006, the patient was sent back to the intensive care unit for increased  respiratory distress and was then sent back to floor and now has been  doing well.   PROBLEM LIST:  Acute respiratory failure.      Madaline Savage, MD  Electronically Signed     PKN/MEDQ  D:  08/22/2006  T:  08/22/2006  Job:  [id number]

## 2011-01-15 NOTE — H&P (Signed)
[name], [name]               ACCOUNT NO.:  [id number]   MEDICAL RECORD NO.:  [id number]          PATIENT TYPE:  INP   LOCATION:  A225                          FACILITY:  [facility]   PHYSICIAN:  Osvaldo Shipper, MD     DATE OF BIRTH:  10-18-1931   DATE OF ADMISSION:  08/05/2006  DATE OF DISCHARGE:  LH                              HISTORY & PHYSICAL   PRIMARY CARE PHYSICIAN:  Dr. Syliva Overman.   Patient has been seen by [facility] at [facility]   ADMITTING DIAGNOSES:  1. Shortness of breath.  2. Rule out pulmonary embolism.  3. Possible recurrent pneumonia.  4. Morbid obesity.  5. Type 2 diabetes.  6. Possible nonischemic cardiomyopathy.   CHIEF COMPLAINT:  Sudden onset of chills and shortness of breath.   HISTORY OF PRESENT ILLNESS:  The patient is a 77 year old morbidly obese  African-American female who has medical problems including diabetes,  hypertension and recent admission at [facility] for sepsis secondary  to pneumonia and positive troponins and chest pain.  She underwent a  cardiac cath over there which shows nonobstructive coronary artery  disease.  The EF was estimated at 35%; however, on a 2D echocardiogram  the EF was estimated to be 55%.  It was felt that the patient has  Takotsubo's cardiomyopathy or she probably had coronary vasospasm.   Patient was at her PMD's office today when she felt cold, developed  chills and became short of breath and her PMD sent her over to the ED.  She denied any chest pain.  She has been having cough with clear mucus  expectoration, no blood.  She has had no nausea, vomiting, diarrhea  recently.  She had one episode of emesis back 5 days ago but none since  then.  Denied any fever.  She was discharged from [facility] on  November 30th.   MEDICATIONS AT HOME:  Are somewhat confusing, but she appears to be on  the following, this is pulled off her medication bottles:  1. Ambien as needed for insomnia.  2.  Metformin 500 mg b.i.d.  3. Potassium chloride 20 mEq once a day.  4. Lipitor 10 mg once a day.  5. Lasix 40 mg once a day.  6. Micardis 80 mg once a day.  7. Darvocet N as needed.  8. Hydroxyzine at bedtime.  9. Combivent as needed.  10.Coreg 6.25 mg b.i.d.  11.Requip 0.5 mg q.h.s.  12.Moexipril 7.5 mg daily.  13.Isosorbide mononitrate 30 mg once a day.  14.Enteric coated aspirin 325 mg once a day.   ALLERGIES:  PENICILLIN CAUSES HIVES, BUT SHE HAS TOLERATED  CEPHALOSPORINS BASED ON PREVIOUS HOSPITAL RECORDS.   PAST MEDICAL HISTORY:  1. Diabetes type 2.  2. Morbid obesity.  3. Dyslipidemia.  4. Possible restless leg syndrome.  5. Hypertension.  6. She had recent admission for septic shock and pneumonia.  She was      transferred to [facility] ICU.  She underwent cardiac cath      for elevated troponin for chest pain and was found to  have      nonobstructive coronary artery disease and possible re-      cardiomyopathy of nonischemic origin.   OTHER SURGERIES:  Include just hernia operations.   SOCIAL HISTORY:  Lives with her daughter, quit smoking 8 years ago, has  about a 25-30 pack year history of smoking, no alcohol use, no illicit  drug use, uses a cane and a walker to ambulate.   FAMILY HISTORY:  Significant for diabetes in her father, otherwise  unremarkable.   REVIEW OF SYSTEMS:  A 10-point review of systems was unremarkable  otherwise.  GENERAL: was just positive for weakness.  NEUROLOGICAL:  Unremarkable.  ENDOCRINE:  Unremarkable.  RESPIRATORY:  Please see HPI.  CARDIOVASCULAR:  See HPI.  CARDIOVASCULAR:  See HPI.  GI:  Unremarkable.  GU:  Unremarkable.  MUSCULOSKELETAL:  Unremarkable.  RENAL:  Unremarkable.   PHYSICAL EXAMINATION:  VITAL SIGNS:  I was told the patient was hypoxic  when she presented, but it is not recorded.  She has always been on O2,  here is 94%.  Temperature 97.4, blood pressure initially was 95/62 then  recovering to 112/38,  pulse 120's, tachycardic, irregular, respiratory  rate was 24 when she presented, currently about 20.  GENERAL EXAMINATION:  Morbidly obese African-American female who seems  very anxious, in no distress otherwise.  HEENT:  There is no pallor, no icterus.  Oral  mucous membranes slightly  dry, no lesions are noted.  NECK:  Soft and supple, no thyromegaly is appreciated.  CARDIOVASCULAR:  S1, S2 is normal, regular, no murmurs appreciated.  LUNGS:  There are some crackles at bilateral bases, right side more than  the left.  No wheeze is appreciated.  ABDOMEN:  Obese.  Nontender, nondistended.  EXTREMITIES:  Without edema.  Peripheral pulses palpable.  Chronic skin  changes noted in both legs.  NEUROLOGIC:  The patient is alert and oriented x3, no focal neurological  deficit appreciated.   LAB DATA:  White count is 9.2, hemoglobin 14.4, MCV is 92, platelet  count 356,000, neutrophils 91%.  Potassium was 5.6, sodium 137, chloride  97, bicarb 26, glucose 99, BUN is 39, creatinine 2.  Her BUN, creatinine  was normal one week ago.  Total bili normal, alk phos 184, SGOT normal,  albumin 2.8, myoglobin was 500.  CK MB 8.4.  BN peptide 104.  An EKG  shows sinus tachycardic rhythm with a normal axis.  This is a poor  quality EKG.  Possible Q waves in V3, V4, although it is very difficult  to be sure.  Do not appreciate any significant ST changes at this time.   Chest x-ray suggests possible cardiac enlargement, pulmonary venous  hypertension is present and borderline interstitial edema is also seen.  Possibility of a left lower lobe pneumonia is present.   ASSESSMENT AND PLAN:  This is a 77 year old African-American female with  medical problems as stated earlier who presents with sudden onset chills  and shortness of breath at her PMD's office.  Differential diagnoses  include pneumonia considering her x-ray findings and recent hospitalization.  Should also consider healthcare associated  pneumonia  in this individual.  Other possibilities include pulmonary embolism  considering again sudden onset of symptoms and recent hospitalization.  Cardiac etiology somewhat less likely because of her recent cardiac  catheterization.  Beta natriuretic peptide is only mildly elevated;  hence, this is likely not congestive heart failure.   PLAN:  1. Pulmonary.  We will get a D-dimer, put  her on oxygen, admit her to      Telemetry Floor.  Cover with cefepime for now.  She has been given      one dose of Avelox in the ED.  Cefepime because we are suspecting      healthcare associated pneumonia.  Depending on the D-dimer, a VQ      scan may be ordered because of her elevated creatinine.  2. Acute renal failure possibly from dehydration.  Will give her      gentle fluid of about 1 liter over the next 12 hours to help with      her dehydration and renal function.  Repeat set of labs tomorrow.      Kayexalate has been given by the ED physician for her hyperkalemia.      A BMET will be checked later tonight to make sure potassium is      going down.  3. Diabetes.  Will check CBGs q.a.c. and h.s., put her on sliding      scale.  Hold metformin because of elevated creatinine.  Check her      HBA1c.  HBA1c has not been checked recently.  4. Hypertension.  I will continue her moexipril for now.  If her renal      function gets worse we will definitely hold it.  I will, however,      hold her Micardis.  Continue and hold her Lasix for today.   Other labs that were done during recent hospitalization include a TSH of  1.121 on November 24th, cortisol 59, cholesterol was also checked  recently.  Troponins were as high as 18 during this recent  hospitalization.   Further management decisions will be based on results of initial testing  and patient response to treatment.      Osvaldo Shipper, MD  Electronically Signed     GK/MEDQ  D:  08/05/2006  T:  08/05/2006  Job:  [id number]   cc:    Milus Mallick. Lodema Hong, M.D.  Fax: [phone]

## 2011-01-15 NOTE — Op Note (Signed)
NAME:  [name], [name]                         ACCOUNT NO.:  [id number]   MEDICAL RECORD NO.:  [id number]                   PATIENT TYPE:  INP   LOCATION:  A225                                 FACILITY:  [facility]   PHYSICIAN:  Dirk Dress. Katrinka Blazing, M.D.                DATE OF BIRTH:  June 25, 1932   DATE OF PROCEDURE:  DATE OF DISCHARGE:                                 OPERATIVE REPORT   PREOPERATIVE DIAGNOSIS:  Incisional hernia.   POSTOPERATIVE DIAGNOSIS:  Large ventral hernia with incarceration.   PROCEDURE:  Ventral hernia repair with mesh graft.   SURGEON:  Dirk Dress. Katrinka Blazing, M.D.   DESCRIPTION:  Under general anesthesia the patient's abdomen was prepped and  draped in a sterile field.  A midline incision was made with excision of the  ulceration in the midportion of the supraumbilical midline. In the  superficial subcutaneous tissue a large hernia sac was encountered.  The  hernia sac was dissected back to the fascia with the finding of a large  ventral hernia defect which was separate from the umbilicus.  The sac was  entered and the sac contained most of the transverse colon.  A large volume  of omentum and a large amount of small bowel.  The neck of the sac was  dissected.  Excess omentum was clamped and tied with ligatures of 2-0 Dexon.  The viscera was then placed back into the abdominal cavity.  There did not  seem to be any increased pressure by pushing the viscera back into the  peritoneal cavity.  The edges of the fascia were inspected and they were  severely attenuated and markedly separated.  It was elected to dissect the  fascial edges bilaterally and then envelope each side of the fascia and  muscle with 6 x 6 inch piece of mesh graft.  This was done with enough  separation to allow a tension-free closure of the mesh in the midline.   The mesh was sutured through-and-through with interrupted #0 Prolene on the  most lateral aspect.  More medial to this a #1 Prolene was chosen  and a  running #0 Prolene was used to secure the mesh and fascia. This was done  bilaterally.  Once sponge, needle, instrument, and blade counts were  verified as correct; the fascia and mesh were then closed in the midline  using a running #1 Prolene.  This a good repair.  A JP drain was placed over  the graft and brought out through a left lower quadrant incision.  The  attenuated subcutaneous tissue was closed with 2-0 Biosyn.  The skin was  closed with staples.  Drain was secured with 3-0 nylon.  The patient  tolerated the procedure well.  She was awakened from anesthesia, transferred  to a bed, and taken to the post-anesthesia care unit for further monitoring.      ___________________________________________  Dirk Dress. Katrinka Blazing, M.D.   LCS/MEDQ  D:  02/07/2004  T:  02/07/2004  Job:  [id number]

## 2011-01-15 NOTE — H&P (Signed)
[name], [name]               ACCOUNT NO.:  [id number]   MEDICAL RECORD NO.:  [id number]          PATIENT TYPE:  INP   LOCATION:  A330                          FACILITY:  [facility]   PHYSICIAN:  Margaretmary Dys, M.D.DATE OF BIRTH:  [birth year]   DATE OF ADMISSION:  08/29/2006  DATE OF DISCHARGE:  LH                              HISTORY & PHYSICAL   PRIMARY CARE PHYSICIAN:  Syliva Overman, M.D.   ADMITTING DIAGNOSES:  1. Acute abdominal pain.  2. Nausea and vomiting.  3. Likely acute gastroenteritis.  4. Leukocytosis.  5. Morbid obesity.  6. Type 2 diabetes.  7. Acute renal insufficiency, likely prerenal azotemia.   CHIEF COMPLAINT:  Nausea, vomiting and diarrhea of 2 days' duration.   HISTORY OF PRESENT ILLNESS:  [name] is a 77 year old African  American female who presented to the emergency department due to nausea  and vomiting.  She also described diffuse abdominal pain which started  about 2 days ago.  She also had some diarrheal episodes yesterday,  although this improved.  The patient lives with her daughter.  She  denies anyone was ill at home.  She currently feels slightly better  after receiving some Zofran in the emergency room.  Evaluation in the  emergency room revealed that she had a leukocytosis and there is concern  for possible infectious cause.  A CT scan as explained below was  unremarkable.  The patient is now being admitted for observation and  further evaluation of her leukocytosis with concern for possible  underlying infection.   REVIEW OF SYSTEMS:  Ten-point review of systems is otherwise negative.  She denies any shortness of breath.  No chest pain.  No fevers.  She has  some chills.  No headaches or known dizziness.   PAST MEDICAL HISTORY:  1. Morbid obesity.  2. Type 2 diabetes.  3. Ischemic cardiomyopathy.  4. Dyslipidemia.  5. Hypertension.  6. Restless leg syndrome.  7. History of septic shock.  8. Pneumonia.   MEDICATIONS:  1.  Metoprolol 25 mg p.o. b.i.d.  2. Lasix 40 mg p.o. b.i.d.  3. Potassium chloride 20 mEq p.o. b.i.d.  4. Metformin 500 mg p.o. b.i.d.  5. Lipitor 10 mg p.o. once a day.  6. Micardis 80 mg p.o. once a day.  7. Combivent two puffs as needed.  8. Aspirin 325 mg p.o. once a day.  9. Xanax 0.25 mg as needed.  10.Phenergan 25 mg p.o. as needed.  11.Requip 1 mg p.o. once a day.  12.Oxygen 2 L a minute.   ALLERGIES:  She is allergic to PENICILLINS.   FAMILY HISTORY:  Noncontributory.   SOCIAL HISTORY:  The patient lives at home with family, does not smoke  or drink alcohol, quit smoking several years ago.  The patient is able  to ambulate with a walker.   PHYSICAL EXAM:  GENERAL:  The patient was conscious, alert, comfortable,  not in acute distress.  VITAL SIGNS:  Vital signs on arrival in the emergency room revealed a  blood pressure of 132/61, pulse of 84, respirations 20, temperature  96.9.  Oxygen saturation was 99% on 2 L.  HEENT:  Normocephalic, atraumatic.  Oral mucosa was moist with no  exudates.  NECK:  Supple.  No JVD.  No lymphadenopathy.  LUNGS:  Decreased air entry bilaterally, no crackles or wheeze.  No  rhonchi was heard.  HEART:  S1 and S2, irregular distant heart sounds.  ABDOMEN:  Markedly distended and protuberant due to obesity, but it was  soft, was not tender and there was no guarding.  No rigidity.  Bowel  sounds were slightly hyperactive.  EXTREMITIES:  The patient has evidence of chronic stasis dermatitis and  bilateral pitting pedal edema.  CNS:  Exam was grossly intact with no focal neurological deficits.   LABORATORY/DIAGNOSTIC DATA:  White blood cell count was 24.1, hemoglobin  11.8, hematocrit 36 and platelet count was 372,000, neutrophils of 80%.  Sodium 137, potassium 4.8, chloride of 103, CO2 23, glucose was 97, BUN  is elevated at 30, creatinine is 2.35.  AST 21, ALT of 23, albumin 2.4.  Lipase was 10.  Urinalysis was unremarkable.  Urine microscopy  was  unremarkable.   CT scan of the abdomen and pelvis shows no acute abnormalities.   ASSESSMENT:  [name] is a 77 year old African American female  presenting with acute abdominal pain, nausea, vomiting, diarrhea and  leukocytosis.  This is fairly consistent with likely gastroenteritis  with likely viral origin.  The patient does not have any fever.  Diarrhea is also slightly improved today.   PLAN:  Plan is to admit her.  She has evidence of dehydration with  worsening of kidney function; we will hydrate her with saline 150 mL an  hour.  We will repeat a BMET in the morning.  The patient has been  empirically started on Cipro and Flagyl to cover for any possible GI  pathogens that may have caused this.  The gallbladder was noted to be  unremarkable on the CT scan.  If the patient develops any more pain, we  will request an ultrasound to further evaluate her biliary tree.  It is  noted though that her LFTs are all unremarkable.   Lipase was normal.   CT scan shows some left lower lobe pulmonary atelectasis or scarring.  The patient's symptoms do not suggest any evidence of pneumonia and her  atelectasis would not be unusual, based on her morbid obesity.      Margaretmary Dys, M.D.  Electronically Signed     AM/MEDQ  D:  08/30/2006  T:  08/30/2006  Job:  [id number]   cc:   Milus Mallick. Lodema Hong, M.D.  Fax: [phone]

## 2011-01-15 NOTE — Consult Note (Signed)
[name], [name]               ACCOUNT NO.:  [id number]   MEDICAL RECORD NO.:  [id number]          PATIENT TYPE:  INP   LOCATION:  IC07                          FACILITY:  [facility]   PHYSICIAN:  Edward L. Juanetta Gosling, M.D.DATE OF BIRTH:  June 15, 1932   DATE OF CONSULTATION:  07/23/2006  DATE OF DISCHARGE:                                 CONSULTATION   REASON FOR CONSULTATION:  Pneumonia with sepsis.   HISTORY:  [name] is a 77 year old who is a primary care patient of  Dr. Syliva Overman.  [name] began having shortness of breath about  a week prior to admission.  She has been to the emergency room and was  treated.  Went to Dr. Anthony Sar office and was again given antibiotics  including Levaquin and she had been getting a little bit better until  about 3 p.m. on the day of admission.  She developed a severe fever to  104.8, chills, and came to the emergency room.  When she came to the  emergency room, she had a right-sided pneumonia.  She had marked  leukocytosis.  She had elevated CK and troponin, elevated liver enzymes,  elevated BUN and creatinine.  Because of her multiple illnesses, there  was an attempt to transfer her to [facility] but there was no  bed available so she has remained here.  She has had to have blood  pressure support but she is much improved.  She says she actually feels  okay.  She is awake and alert and able to provide history.   PAST MEDICAL HISTORY:  Her past medical history is positive for  diabetes, hyperlipidemia, hypertension, chronic venous insufficiency,  osteoarthritis, gastroesophageal reflux disease.  She had a ventral  hernia which had surgery and she has morbid obesity.   MEDICATIONS ON ADMISSION:  1. Ambien CR 12.5 mg as needed.  2. Verapamil 200 mg at bedtime.  3. Metformin 500 mg b.i.d.  4. Potassium chloride 20 mEq daily.  5. Lipitor 10 mg daily.  6. Lasix 80 mg daily.  7. Micardis 80 mg daily.  8. Darvocet N-100 on a  p.r.n. basis.  9. Hydroxyzine that she takes on a p.r.n. basis at bedtime.  10.She had been on Levaquin recently.  11.Combivent on a p.r.n. basis.  12.Requip-it is said to be 15 mg daily-I think the dose is wrong.  13.Hyoscyamine.   ALLERGIES:  SHE IS ALLERGIC TO PENICILLIN.   SOCIAL HISTORY:  She does not smoke.  She does not drink any alcohol.  She uses a walker or a wheelchair to ambulate.  She is retired from  [employer].   FAMILY HISTORY:  Having a family history positive for diabetes.   REVIEW OF SYSTEMS:  On her review of systems otherwise, she says she has  been sick for about 3 months.   PHYSICAL EXAMINATION:  GENERAL:  Her physical examination today shows  she is awake and alert.  VITAL SIGNS:  Her pulse is 76.  Blood pressure 110/90.  Respiratory rate  is listed at 68 but she looks much more comfortable than  that.  She is  able to converse in complete sentences with no difficulty.  She is awake  and alert.  HEENT:  Her HEENT shows that her mucous membranes are slightly dry.  NECK:  Her neck is supple without masses.  She does not have jugular  venous distention.  CHEST:  Her chest shows decreased breath sounds bilaterally; I do not  hear rhonchi, wheezes, or rales now.  Her heart is regular with no  murmur.  ABDOMEN:  Obese, soft, nontender.  EXTREMITIES:  Her extremities showed chronic venous stasis changes.  NEUROLOGIC:  CNS is grossly intact.   LABORATORY DATA:  Initially, her lab work showed her BUN was 15,  creatinine 1.3, white count 15,600 with 20% bands.  Cardiac markers were  elevated.  Now, her venous lactic acid 3.1, slightly high, that was done  this morning at about 3.  Comprehensive metabolic profile from today  shows her sodium is 133, glucose 172, BUN is 18, creatinine 1.5, SGOT  68, SGPT 50.  Her albumin is 2.3.  Her CBC shows white count now 34,000,  hemoglobin 13.1, platelets 294.  She still has 39% band forms.  Cardiac  panel:  Troponin  6.4, MB 27.7, with a CK of 465, BNP 99.2, and a blood  gas this morning pO2 94, pCO2 of 30, pH 7.38.  Chest x-ray shows  bilateral alveolar infiltrates; the right lower lobe is the greatest  extent.  Her   CURRENT MEDICATIONS:  1. Albuterol and Atrovent nebulizer treatments.  2. She has received aspirin.  3. Plavix 75 mg daily.  4. Guaifenesin 600 mg b.i.d.  5. She is on heparin.  6. Solu-Cortef 50 mg every 6 hours.  7. Primaxin 500 mg every 6 hours.  8. Insulin on a sliding scale.  9. Protonix 40 mg daily.  10.Zocor 20 mg daily.  11.Vancomycin per pharmacy protocol.  12.She is on Levophed pressor support.   ASSESSMENT:  Assessment then is that she has bilateral infiltrates which  are alveolar and are suggestive more of an atypical process or staph.  She is being treated for staph now.  Her clinical course is less likely  related to an atypical process considering the very high fever and the  sepsis and I would favor more a staph sepsis at this point.  She is  being treated with broad-spectrum antibiotics.  Dr. Sherle Poe has  ordered Levaquin and I agree with that to make sure that we have got  excellent antibiotic coverage.  She has fairly mild chronic renal  failure.  She has had some increase in her liver function tests  suggesting shock liver.  She has had a very marked white blood cell  response with marked band forms in her blood.  She is diabetic and she  has elevated cardiac enzymes.   ASSESSMENT:  My assessment then is that probably her primary problem is  sepsis which has led to the cardiac difficulties, etc..  We need to go  ahead and treat her as mentioned with Levaquin in addition to the other  antibiotics, continue with her other medications, and follow.      Edward L. Juanetta Gosling, M.D.  Electronically Signed     ELH/MEDQ  D:  07/23/2006  T:  07/23/2006  Job:  [id number]  cc:   Milus Mallick. Lodema Hong, M.D.  Fax: [phone]

## 2011-01-15 NOTE — Group Therapy Note (Signed)
[name], [name]               ACCOUNT NO.:  [id number]   MEDICAL RECORD NO.:  [id number]          PATIENT TYPE:  INP   LOCATION:  A307                          FACILITY:  [facility]   PHYSICIAN:  Margaretmary Dys, M.D.DATE OF BIRTH:  1931-09-24   DATE OF PROCEDURE:  11/12/2006  DATE OF DISCHARGE:                                 PROGRESS NOTE   SUBJECTIVE:  Patient continues to be concerned about her inability to  swallow.  Further questioning revealed that patient apparently has  esophageal stricture and was dilated back at [facility] a few  months ago.  The exact details are not available, but I did review a  note from GI in February 2008 which showed patient with persistent  problems of dysphagia.  An endoscopy on January 22 did show evidence of  esophageal stricture, hiatal hernia and duodenitis.  This was dilated to  18 mm with no significant improvement.  With the dilation, she continued  to lose weight, and also more concerning is that she has had diarrhea.  A plan to dilate her to about 19 to 20 mm could not proceed last week,  because she was still on aspirin, which she was advised to hold.  However, patient continued progressively to become weaker and then  decided to come here to [facility].   OBJECTIVE:  GENERAL:  Conscious, alert.  Patient is anxious.  VITAL SIGNS:  Blood pressure 122/63, pulse of 95, respirations was 28,  temperature 98.4.  Oxygen saturation was 98% on room air.  Blood sugars  have ranged between 89 to 110.  HEENT:  Normocephalic, atraumatic.  Oral mucosa was moist, no exudates.  NECK:  Supple, no JVD or lymphadenopathy.  LUNGS:  Clear clinically with good air entry bilaterally.  HEART:  S1, S2, regular.  ABDOMEN:  Obese but soft.  EXTREMITIES:  Bilateral trace pitting pedal edema.   LABORATORY/DIAGNOSTIC DATA:  White blood cell count was 18,000,  hemoglobin of 10.9, hematocrit 32.5, platelet count was 256 with no left  shift,  sodium 140, potassium 3.2, chloride of 111, CO2 of 22, glucose  77, BUN of 6, creatinine of 0.86.  Calcium was 6.7.  An albumin done  yesterday was recorded as 1.8.   ASSESSMENT/PLAN:  This is a 77 year old African-American female admitted  to the hospital with generalized weakness, weight loss, failure to  thrive.  Patient does have a history of dysphagia of unclear etiology,  but it does appear that it was secondary to esophageal stricture, which  has been stretched to 18 mm back in January.  Patient was due for  another stretch, but could not have it done, because she was on aspirin.  Plan right now is to request our local gastroenterologist here.  Dr.  Cira Servant asked to see the patient.  She and I actually discussed the  patient, and she will be proceeding with an EGD.  We are also concerned  about her chronic diarrhea, and she will need a colonoscopy.  Her  diarrhea has been going on for at least 3 months, as per the patient.  She does have a leukocytosis of about 18,000, which I am not exactly  clear what it is, but patient is being treated with Zithromax  for  bronchitis.   I presume the Flagyl is for Clostridium difficile.   Patient did have a CT of the abdomen and which showed no acute findingm  with diffuse fatty infiltration of the liver with mild distention of the  gallbladder, although this was regarded to be nonspecific.   I will continue to monitor her, and will see what GI comes up with.      Margaretmary Dys, M.D.  Electronically Signed     AM/MEDQ  D:  11/12/2006  T:  11/12/2006  Job:  [id number]

## 2011-01-15 NOTE — H&P (Signed)
NAME:  [name], [name]                         ACCOUNT NO.:  [id number]   MEDICAL RECORD NO.:  [id number]                   PATIENT TYPE:  INP   LOCATION:  A312                                 FACILITY:  [facility]   PHYSICIAN:  Dirk Dress. Katrinka Blazing, M.D.                DATE OF BIRTH:  October 26, 1931   DATE OF ADMISSION:  05/12/2003  DATE OF DISCHARGE:                                HISTORY & PHYSICAL   This is a 77 year old female admitted to the emergency room with acute  cellulitis of her right lower extremity.  The patient has a long history of  chronic venous insufficiency and chronic stasis dermatitis.  She states that  she was feeling fine until one day prior to admission when she developed  warmth, redness, and swelling and pain of her right lower extremity.  It  increased in intensity and became quite severe.  She was brought to the  emergency room for evaluation and was noted to have significant cellulitis  of the leg and admitted for treatment.  Temperature at the time of admission  is 100.2.  The patient denies having any injury or any type of insect bite.   PAST HISTORY:  She has:  1. Hypertension.  2. Degenerative joint disease.  3. Cardiomegaly.   PRESENT MEDICATIONS:  1. Combivent 2 puffs q.i.d.  2. Univasc 15 mg daily.  3. Singulair 10 mg daily.  4. Vioxx 25 mg daily.  5. Micardis 80 mg daily.  6. Lasix 80 mg daily.  7. Verelan PM 200 mg q.h.s.   SURGERY:  None.   EXAMINATION:  GENERAL:  Morbidly obese in no acute distress.  VITAL SIGNS:  Blood pressure is 100/80, pulse 90, respirations 20,  temperature 100.2.  HEENT:  Unremarkable.  NECK:  Supple.  Without JVD or bruit.  CHEST:  Clear to auscultation.  HEART:  Regular rate and rhythm, without murmur, gallop, or rub.  ABDOMEN:  Massively obese, with a large ventral hernia.  EXTREMITIES:  There is 3+ pitting edema of her right lower extremity below  the knee, with increased redness and warmth.  There is 1+ edema of the  left  lower extremity.  She has bilateral stasis changes on both sides.  NEUROLOGIC:  No focal deficit.   IMPRESSION:  1. Acute cellulitis right lower extremity.  2. Chronic venous stasis disease precipitating #1.  3. Chronic obstructive lung disease.  4. Hypertension.  5. Morbid obesity.  6. Probable pulmonary hypertension with right heart failure.   PLAN:  The patient will be admitted.  She will be maintained on her baseline  medications.  She will be treated with IV antibiotics.  Will get venous  Doppler studies to rule out DVT.  She will receive local wound care and will  also do anticoagulation if indicated.  Dirk Dress. Katrinka Blazing, M.D.    LCS/MEDQ  D:  05/16/2003  T:  05/17/2003  Job:  [id number]

## 2011-01-15 NOTE — Assessment & Plan Note (Signed)
[facility]                         GASTROENTEROLOGY OFFICE NOTE   NAME:[name], Samantha REMICK                      MRN:          [id number]  DATE:10/28/2006                            DOB:          1931/10/27    October 28, 2006   REFERRING PHYSICIAN:  Milus Mallick. Lodema Hong, M.D.   [name] is a 77 year old African American female who returns with  persistent problems with dysphagia.  She has difficulty swallowing most  solid foods and localizes the swallowing difficulty to her throat area.  She underwent a modified barium swallow on October 17, 2006, which  showed difficulty transferring the food bolus from her tongue tip to the  base of her tongue.  She was able to move the bolus with a sip of water.  The speech pathologist was unclear as to the reason for this.  No other  abnormalities were noted.  She underwent upper endoscopy on September 20, 2006, which showed an esophageal stricture, hiatal hernia, duodenitis  and GERD.  Her mild esophageal stricture was dilated to 18 mm.  She did  not notice any improvement with that dilation.   CURRENT MEDICATIONS:  1. Aspirin 81 mg daily.  2. Protonix 40 mg daily.  3. Lasix 40 mg daily.  4. Potassium chloride 20 mEq daily.  5. Lipitor 10 mg daily.  6. Requip 2 mg nightly.  7. Metformin 500 mg b.i.d.  8. Xanax 0.25 mg p.r.n.  9. Phenergan 25 mg p.r.n.  10.Combivent two puffs p.r.n.   ALLERGIES:  PENICILLIN.   PHYSICAL EXAMINATION:  GENERAL APPEARANCE:  Morbidly obese female in no  acute distress.  VITAL SIGNS:  Height 5 feet 8 inches, weight 331 pounds.  Blood pressure  118/74, pulse 64 and regular.  CHEST:  Clear to auscultation bilaterally.  CARDIOVASCULAR:  Regular rate and rhythm without murmurs appreciated.  ABDOMEN:  Soft, nontender, normal active bowel sounds.   ASSESSMENT/PLAN:  Persistent dysphagia.  She has a component of oral  dysphagia noted on the modified barium swallow.  This appears  to resolve  when she takes liquids with solids.  She may also be having a component  of esophageal dysphagia if her esophageal stricture persists.  We will  plan to proceed with repeat upper endoscopy with Savary dilation to 19-  20  mm, which should be more than adequate to allow for swallowing solids.  She may need further  follow-up with speech pathology and follow up with her primary physician  to address the oral dysphagia.     Venita Lick. Russella Dar, MD, [facility]  Electronically Signed    MTS/MedQ  DD: 10/28/2006  DT: 10/28/2006  Job #: [id number]

## 2011-01-15 NOTE — Discharge Summary (Signed)
[name], [name]               ACCOUNT NO.:  [id number]   MEDICAL RECORD NO.:  [id number]          PATIENT TYPE:  INP   LOCATION:  6709                         FACILITY:  [facility]   PHYSICIAN:  Madaline Savage, MD        DATE OF BIRTH:  10/11/1931   DATE OF ADMISSION:  09/01/2006  DATE OF DISCHARGE:                               DISCHARGE SUMMARY   DATE OF DISCHARGE:  Anticipated to be September 05, 2006.   PRIMARY CARE PHYSICIAN:  Dr. Milus Mallick. Lodema Hong, at [place].   CONSULTATIONS IN THE HOSPITAL:  She was seen by Dr. Russella Dar from [facility].   HISTORY OF PRESENT ILLNESS:  Ms. Kohan is a 77 year old lady who has a  significant past medical history of congestive cardiac failure requiring  recent intubation, obesity hypoventilation syndrome, morbid obesity,  COPD who was transferred from [facility] with nausea, vomiting  and diarrhea.  She was admitted at [facility] on August 29, 2006, and she was not improving and so the family wanted her transferred  to [facility].  She states her problems started a few days  after she was discharged from the hospital in December, 2007, when she  did not have any appetite.  While in the hospital she was treated with  antibiotics for vent dependent respiratory failure suspected to be  pneumonia.  She started developing diarrhea which was watery, she was  having nausea and vomiting.  She had workup done at [facility]  which was apparently negative and she was started on Flagyl and Cipro at  [facility].   PROCEDURES DONE IN THE HOSPITAL:  We did an ultrasound abdomen on the  4th of January, 2008, which showed small gallstones and sludge, no  gallbladder wall thickening or pain present, no ductal dilatation.   PROBLEM LIST:  1. Nausea, vomiting, diarrhea.  She most likely has antibiotic      associated diarrhea and we suspected a Clostridium difficile      colitis.  We did send for  stool for Clostridium difficile which      came back as negative but she was already on Flagyl at that time.      She most likely has Clostridium difficile colitis, which has      improved with Flagyl, and she is now tolerating a regular diet and      her diarrhea has completely resolved.  She no longer has abdominal      pain, nausea, vomiting.  She had further workup in the hospital      including an ultrasound of her abdomen which was negative.  We      consulted [facility] to see her who recommended that      she continue with the 14 days of Flagyl.  2. Acute renal failure.  When she went to [facility] she was      in acute renal failure, which was possibly secondary to her over-      diuresis.  At the time of discharge her creatinine is  back to      baseline with a creatinine of 0.8.  We will decrease her dose of      Lasix from 40 mg twice daily to once daily.  3. History of chronic congestive cardiac failure.  She had recently      intubated secondary to her congestive cardiac failure.  At this      point in time she is compensated from that standpoint.  4. Severe protein calorie malnutrition.  She states she has a loss of      appetite since she was in the hospital.  We have started her on      Megace in this hospital.  She will also be started on protein      supplements.  5. Diabetes mellitus.  Her metformin was held secondary to her renal      failure.  We have restarted her metformin as her renal function is      back to normal.   DISCHARGE DIAGNOSES:  1. Antibiotic-associated diarrhea most likely Clostridium difficile      colitis.  2. Acute renal failure secondary to over-diuresis.  3. History of congestive cardiac failure.  4. Severe protein calorie malnutrition.  5. Diabetes mellitus.   DISCHARGE MEDICATIONS:  1. She will be put on Flagyl 500 mg three times daily to be continued      until September 12, 2006.  She will get the complete 14 day  course of      Flagyl.  2. Aspirin 81 mg daily.  3. Protonix 40 mg daily.  4. Lasix 40 mg once daily.  5. Potassium chloride 20 mEq once daily.  6. Lipitor 10 mg once daily.  7. Requip 1 mg daily.  8. Xanax 0.25 mg twice daily as needed.  9. Metformin 500 mg twice daily.  10.Megace 25 mg daily.  11.Phenergan 25 mg every 8 hours by mouth as needed.  12.Combivent 2 puffs four times daily as needed.  13.Resource nutritional liquid, she will take with a meal three times      daily.  14.Beneprotein powder, 1 scoop mixed with water three times daily.  15.Oxygen by nasal cannula at 2 liters.   DISPOSITION:  She is still weak and deconditioned.  During her last  hospital stay we had advised her to go to a skilled nursing facility but  she had refused, but at this point of time she is agreeable to go to a  skilled nursing facility.  We are planning to send her to a skilled  nursing facility at this time for physical therapy.   FOLLOWUP:  She will follow up with her primary care doctor in 1-2 weeks.      Madaline Savage, MD  Electronically Signed     PKN/MEDQ  D:  09/04/2006  T:  09/04/2006  Job:  [id number]

## 2011-01-15 NOTE — Group Therapy Note (Signed)
[name], [name]               ACCOUNT NO.:  [id number]   MEDICAL RECORD NO.:  [id number]          PATIENT TYPE:  INP   LOCATION:  A307                          FACILITY:  [facility]   PHYSICIAN:  Margaretmary Dys, M.D.DATE OF BIRTH:  August 17, 1932   DATE OF PROCEDURE:  11/13/2006  DATE OF DISCHARGE:                                 PROGRESS NOTE   SUBJECTIVE:  Patient was seen by [name] yesterday and the patient's  plan is to rather to go [facility] on Tuesday from the hospital for  further studies. She continues to complain of difficulty with  swallowing, otherwise remains stable.   OBJECTIVE:  Conscious, alert, comfortable, not in acute distress. Vital  signs: Blood pressure 128/72, pulse of 98, respirations 24, temperature  98.6. blood sugars have ranged between 98 - 138.   PHYSICAL EXAM:  Is really unchanged from yesterday.   LABORATORY/DIAGNOSTIC DATA:  White blood cell count was 16.9, hemoglobin  10.8, hematocrit 32.8, platelet count was 269 with no left shift.  Sodium 139, potassium 3.4, this has been corrected. Chloride of 110, CO2  22, glucose of 83, BUN of 6, creatinine was 0.92. Calcium was 6.7,  magnesium was 1.0 and this is being corrected too.   ASSESSMENT AND PLAN:  The patient is a 77 year old female with history  of dysphagia, possibly oropharyngeal dysphagia versus esophageal  stricture. Patient would like to go to [facility] on Tuesday for  studies, including colonoscopy and EGD .I  think this is reasonable. I  appreciate [name]' input.  Will replace magnesium.      Margaretmary Dys, M.D.  Electronically Signed     AM/MEDQ  D:  11/13/2006  T:  11/13/2006  Job:  [id number]

## 2011-01-15 NOTE — Discharge Summary (Signed)
NAME:  [name], [name]                         ACCOUNT NO.:  [id number]   MEDICAL RECORD NO.:  [id number]                   PATIENT TYPE:  INP   LOCATION:  A312                                 FACILITY:  [facility]   PHYSICIAN:  Annia Friendly. Loleta Chance, M.D.                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  She was admitted, on May 12, 2003, for treatment of acute  cellulitis of the right lower extremity.  History was significant for  increased chronic swelling of her right lower leg, redness and significant  pain of her right lower leg.  Moreover, her right leg was significantly  warm.  The patient denied trauma to the leg, a fall or an insect bite.   MEDICAL HISTORY:  Positive for:  1. Chronic venous insufficiency.  2. Stasis dermatitis of both legs.  3. Hypertension.  4. Osteoarthritis.  5. Morbid obesity.  6. Cardiomegaly.  7. Chronic lung disease.   HABITS:  Positive for cigarette smoking.  Habits were negative for use of  ethanol and street drugs.   PAST MEDICAL HISTORY:  Positive hospitalizations for cellulitis of her right  lower leg at [facility] greater than five years ago.   Problem #1.  Acute cellulitis of right lower leg.   PHYSICAL EXAMINATION:  VITAL SIGNS:  On admission revealed a temperature of  100.2, pulse 90, respirations 20, blood pressure 100/80.  EXTREMITIES:  Demonstrated 3+ pitting edema right lower extremity below the  knee with redness and warmth.  Examination of the left lower extremity  demonstrated 1+ edema.  The patient had hyperpigmented change involving the  distal tibia bilaterally.   SIGNIFICANT LABS ON ADMISSION:  Were as follows:  white count 12.0,  hemoglobin 13.7, hematocrit  41.0, platelets 303,000.  Sodium 137, potassium  4.1, chloride 104, CO2 30, BUN 17, creatinine 1.2, calcium 8.8.  Urinalysis  (specific gravity greater than 1.030, pH 5.0, no glucose, no hemoglobin, no  bilirubin, no ketones, no protein,  nitrate negative).   The patient was treated with IV antibiotics, ordered blood culture by ER,  bed rest with right leg elevated, warm moist compresses to the right leg,  analgesia for pain.  The patient experienced significant decreased swelling  of both legs.  She experienced resolution of redness of her right leg.  The  pain had decreased significantly compared to admission.  She was afebrile at  the time of discharge.  She was alert and oriented to person, place and  time.  She had been scheduled for followup at [facility], on  May 22, 2003, for treatment of a bullous like lesion of right lower  leg - lateral aspect, during this hospitalization.   Problem #2.  Chronic venous insufficiency with stasis dermatitis.  The  patient was treated with feet elevation as much as possible.  Moreover, she  was treated with heparin 5,000 units subcu under the supervision of the  pharmacy prophylactic for prevention of a deep vein thrombosis.  A venous  doppler study was done of the right leg, on May 14, 2003, and  demonstrated no evidence of deep vein thrombosis.  The importance of feet  elevation was discussed with the patient before discharge.  Moreover, the  importance of good skin care was also emphasized to the patient.  And  lastly, the importance of weight loss was discussed with the patient.   Problem #3.  Acute pulmonary edema secondary to hypertension, cardiovascular  disease.  Chest x-ray, on admission, demonstrated no significant interval  change when compared to the previous exam, on May 09, 2003.  There was  no interval change in mild interstitial air space disease and pulmonary  venous hypertension.  Conclusion was  stable mild cardiomegaly and congestive  heart failure.  The patient was treated with IV Lasix initially.  Moreover,  she was given potassium supplement.  She was also put on oxygen 2 liters per  minute via nasal cannula.  An echocardiogram was repeated during this  hospitalization.  Weight on admission was 446 pounds.  Last recorded weight  was 433 pounds.  Echocardiogram, on May 13, 2003, was read as mild to  moderate left atrial enlargement, no overall abnormalities of the left  ventricle.  It was difficult to see the ventricular echocardium; however, it  appeared that there was normal systolic function grossly, mild left  ventricular hypertrophy, no important valvular abnormality, normal right  sided structure and function, as read by Dr. Valera Castle.  The patient was  able to lie on side at time of discharge.  She was not complaining of  shortness of breath at rest.  She was receiving physical therapy for  ambulation during this hospitalization and was able to walk short distances  without being significantly short of breath.  ABG on room air demonstrated  the following, on May 12, 2003:  pH 7.39, pCO2 41.6, pO2 58.9.  Repeat  ABGs, on May 20, 2003, on room air, revealed the following:  pH 7.39,  pCO2 44.7, pO2 63.3, O2 sat 93.0%.  The patient's lung fields were clear at  the time of discharge.  Heart exam was within normal limits.  EKG, during  this hospitalization, demonstrated a normal sinus rhythm with possible left  atrial enlargement, nonspecific T wave abnormality, by Dr. Carylon Perches, on  May 12, 2003.   Problem #4.  COPD.  The patient was treated with an Atrovent metered-dose  inhaler, two puffs q.i.d. and oxygen, during this hospitalization.  She  responded to treatment clinically.  She was not short of breath at all at  rest.  She was able to ambulate with the use of a walker at the time of  discharge.  Problem #5.  Morbid obesity.  The patient was  put on a calorie restricted  diet during this hospitalization.  Moreover, the dietician did see the  patient during this hospitalization.  The importance of weight loss  pertaining to her joint disease, respiratory status and her chronic venous  disease were discussed with the patient at length.   Problem #6.  Hypertension.  Blood pressure, on admission, was 100/80 with a  pulse of 90.  Lung fields were negative for wheezes or crackles at the time  of discharge.  Heart exam was within normal limits.  The patient was treated  with sodium restriction, and antihypertension meds.  Her blood pressure was  controlled during this hospitalization.  Her blood pressure, on the morning  of discharge, was 117/63.  Heart exam was within normal limits.  She had no  complaints of chest pain, palpitations, or dizziness.  She was alert and  oriented  to person, place and time.   Problem #7.  Uterine fibroid.  X-ray, as an outpatient, demonstrated a mass  in the right pelvic area.  A CT was not able to be done at [facility].  The  radiologist suggested a CT without contrast to determine the mass.  CT  without contrast at [facility] determined the mass to be a large  uterine calcified fibroid.   Problem #8.  Type 2 diabetes mellitus.  Serum glucose, on admission, was  136.  Repeat serum glucose, during this hospitalization, revealed values of  197 on May 15, 2003 at 0455, 133 on May 16, 2003 at 0505, 168 on  May 18, 2003 at 0447.  The patient was started on Glucophage XR 500 mg  p.o. every day with breakfast.  The dietician was asked to see the patient  again because of the development of diabetes probably related to her  obesity.  Home health will monitor blood sugar as an outpatient.  Hemoglobin  A1c was 7.5% (normal 4.6-6.1%).   Problem #9.  Osteoarthritis.  The patient did not experience any joint  swelling, redness or hotness.  She was receiving analgesics for pain.  She   will be continued on analgesia as an outpatient for pain.  The importance of  weight loss and the effect on weightbearing joints was discussed with the  patient.  The patient was discharged home on May 21, 2003.   DIET:  Low-salt, 1600 calories, carbohydrate modified.   ACTIVITY:  Ambulate with walker.   MEDICATIONS:  1. Lasix 80 mg p.o. every day for fluid.  2. K-Dur 20 mEq p.o. b.i.d.  3. Verelan 200 mg one capsule p.o. at bedtime.  4. Levaquin 750 mg p.o. every day for infection of right leg.  5. Percocet 5/500, one tablet p.o. every [name] to eight hours as needed for     pain.  6. Combivent metered-dose inhaler two puffs four times a day.  7. Glucophage XR 500 mg, one tablet p.o. every day with breakfast.  8. Prevacid 15 mg, one capsule p.o. every day.   FOLLOWUP:  1. [facility] on May 22, 2003 at 2:30.  2. Follow up at the office within two weeks. 3. Home health will follow the patient's blood sugar and edema of leg as an     outpatient.  The patient was advised to check her blood sugar at 7 a.m.     and 5 p.m. every day.   FINAL PRIMARY DIAGNOSES:  1. Acute cellulitis, right lower extremity.  2. Pulmonary edema with hypoxemia.  3. New onset type 2 diabetes mellitus.   SECONDARY DIAGNOSES:  1. Chronic venous insufficiency with stasis dermatitis.  2. Hypertension.  3. Uterine fibroid.  4. Osteoarthritis.  5. Cardiomegaly with pulmonary hypertension.  6. Chronic abdominal wall hernia.     ___________________________________________  Annia Friendly. Loleta Chance, M.D.   Levonne Hubert  D:  05/25/2003  T:  05/25/2003  Job:  [id number]

## 2011-01-15 NOTE — Discharge Summary (Signed)
[name], [name]               ACCOUNT NO.:  [id number]   MEDICAL RECORD NO.:  [id number]          PATIENT TYPE:  INP   LOCATION:  IC07                          FACILITY:  [facility]   PHYSICIAN:  Mobolaji B. Bakare, M.D.DATE OF BIRTH:  01-17-1932   DATE OF ADMISSION:  08/05/2006  DATE OF DISCHARGE:  12/10/2007LH                               DISCHARGE SUMMARY   ADDENDUM:   PRIMARY CARE PHYSICIAN:  Milus Mallick. Lodema Hong, M.D.   This is an addendum to an earlier dictation done.   MEDICATIONS AT THE TIME OF TRANSFER:  1. Aspirin 325 mg daily.  2. Protonix 40 mg daily.  3. Albuterol two puffs __________.  4. Requip 0.5 mg q.h.s.  5. Isosorbide mononitrate 30 mg daily.  6. Zocor 20 mg daily.  7. Sliding scale insulin with NovoLog.  8. Lovenox 100 mg q.24h.  9. Cefepime 2 g IV q.12h.  10.Neo-Synephrine infusion.  11.Dopamine infusion.  12.Tylenol 650 mg q.4h. p.r.n.  13.Dilaudid 1 mg IV q.4h. p.r.n.      Mobolaji B. Corky Downs, M.D.  Electronically Signed     MBB/MEDQ  D:  08/08/2006  T:  08/08/2006  Job:  [id number]   cc:   Milus Mallick. Lodema Hong, M.D.  Fax: [phone]

## 2011-01-15 NOTE — Discharge Summary (Signed)
NAME:  [name], [name]                         ACCOUNT NO.:  [id number]   MEDICAL RECORD NO.:  [id number]                   PATIENT TYPE:  INP   LOCATION:  A225                                 FACILITY:  [facility]   PHYSICIAN:  Dirk Dress. Katrinka Blazing, M.D.                DATE OF BIRTH:  [birth year]   DATE OF ADMISSION:  02/07/2004  DATE OF DISCHARGE:  02/11/2004                                 DISCHARGE SUMMARY   DISCHARGE DIAGNOSES:  1. Ventral hernia with incarceration.  2. Gastroesophageal reflux disease.  3. Hypertension.  4. Osteoarthritis.  5. Diabetes mellitus.  6. Chronic venous insufficiency.   SPECIAL PROCEDURE:  February 07, 2004, ventral hernia repair with mesh graft.   DISPOSITION:  Patient discharged home in stable satisfactory condition.   DISCHARGE MEDICATIONS:  1. Keflex 500 mg four times a day x 5 days.  2. Levaquin 500 mg daily x 5 days.  3. Tylox 1 q.4h. p.r.n. pain.  4. Combivent 2 puffs q.4h.  5. Univasc 15 mg daily.  6. Aciphex 20 mg daily.  7. Glucophage XR 500 mg q.a.m. and 250 mg q.p.m.  8. Verelan 200 mg q.h.s.  9. Micardis 80/12.5 mg daily.  10.      Potassium chloride 20 mEq daily.  11.      Crestor 10 mg daily.   The patient is to have home health nursing care for two weeks with wound  care and Jackson-Pratt management.   SUMMARY:  A 77 year old female with a history of large ventral hernia.  The  patient presented with incarceration and pain in the hernia site.  She is  morbidly obese, and it was felt that since she was not obstruction that she  should have a cardiac workup.  Echocardiogram was done, and this was normal  with normal left ventricular function and no left ventricular wall  abnormalities.  Stress test was completed, and this revealed no inducible  myocardial ischemia.  During the interim period, the hernia became larger,  and she developed an ulceration of the skin over the hernia site.  She was,  therefore, admitted for incisional hernia  repair.   Other problems included gastroesophageal reflux disease, hypertension,  diabetes mellitus, and chronic venous insufficiency.   PHYSICAL EXAMINATION:  VITAL SIGNS:  O2 saturation was 97% on room air,  blood pressure 130/80, pulse 92.  CHEST:  Clear.  HEART:  Regular without ectopy or gallop.  ABDOMEN:  Massively obese with very larger tender hernia with erosion of the  skin over the hernia site.  EXTREMITIES:  Examination reveals chronic venous stasis changes with stasis  dermatitis with 2+ edema.   HOSPITAL COURSE:  The patient was admitted through day surgery and on 10  June, repair of the large ventral hernia was carried out.  The large hernia  contained a portion of the transverse colon, small bowel, and omentum.  These structures were densely incarcerated  in a large hernia sac, and the  abdominal wall skin was severely attenuated.  After the edges of the hernia  were dissected out, the edges of the fascia and muscle on each side were  enveloped in 6 x 6 sheets of mesh.  The mesh was sewn in two rows through  and through to the muscle and fascia, and then the edges of the mesh were  sutured in the midline.  It was felt that this would be the best method of  closure and would give the least possibility for recurrence.  The patient  did exceptionally well in the postoperative period.  She was given Lovenox  subcutaneously for DVT prophylaxis.   On the second postoperative day, she was up and moving about.  On June 14,  she was doing well.  She had no complaints.  She had multiple bowel  movements.  Her incision was healing nicely.  Jackson-Pratt drain was  totally serous.  The overlying skin was adherent.  The patient was,  therefore, discharged home on oral antibiotics for 5 days with followup in  the office in two weeks.     ___________________________________________                                         Dirk Dress Katrinka Blazing, M.D.   LCS/MEDQ  D:  03/15/2004  T:   03/15/2004  Job:  [id number]   cc:   Milus Mallick. Lodema Hong, M.D.  [address]  [place], [place] [address]  Fax: [phone]

## 2011-01-15 NOTE — Discharge Summary (Signed)
[name], [name]               ACCOUNT NO.:  [id number]   MEDICAL RECORD NO.:  [id number]          PATIENT TYPE:  INP   LOCATION:  3734                         FACILITY:  [facility]   PHYSICIAN:  Madaline Savage, MD        DATE OF BIRTH:  [birth year]   DATE OF ADMISSION:  08/08/2006  DATE OF DISCHARGE:  08/22/2006                               DISCHARGE SUMMARY   PRIMARY CARE PHYSICIAN:  Milus Mallick. Lodema Hong, M.D. at [place].   CONSULTATIONS IN THE HOSPITAL:  1. Seen by [facility] in the ICU.  2. Was seen by Dr. Orvan Falconer from infectious disease.   DISCHARGE DIAGNOSES:  1. Ventilator-dependent respiratory failure secondary to congestive      cardiac failure.  2. Morbid obesity.  3. Obstructive sleep apnea/obesity hypoventilation syndrome.  4. Fever and pneumonia.  5. Chronic obstructive pulmonary disease.  6. Acute renal failure, which has resolved.  7. Supraventricular tachycardia, which has resolved.  8. Diabetes mellitus type 2, which is stable.   PROCEDURES DONE IN THE HOSPITAL:  1. She had an echocardiogram done on August 08, 2006 which showed no      change from her earlier echocardiogram in November 2007.  Her      ejection fraction was estimated to be 50%.  2. She had an x-ray of the chest on August 18, 2006 which showed      increase in diffuse air space disease.  She had Dopplers done on      August 16, 2006 which showed no evidence of DVT, SVT, or Baker's      cyst.   HISTORY OF PRESENT ILLNESS:  [name] is a very complex 77 year old  morbid obesity African American lady with multiple medical problems who  was transferred to [facility] with increased respiratory  distress.  She was recently in [facility] and was discharged on  July 27, 2006 with a right lower lobe pneumonia and non-ST elevation  MI.  At that time, she had a cardiac catheterization which showed normal  coronaries.  Since she came to the hospital, she was sent to  the ICU and  onto the critical care service and was intubated on August 08, 2006  and was extubated on August 09, 2006.  She was then transferred to the  floor on August 13, 2006, but returned to the ICU on August 14, 2006  again for increased respiratory distress.  At this time, she was put on  a BIPAP, and since then she has improved and is almost back to her  baseline.   PROBLEM LIST:  1. Acute renal failure.  On admission, she had an elevated white      count, and she was perceived to be having pneumonia.  She was      treated with cefepime, and she was intubated, and was extubated the      next day.  Since then, she has been on BIPAP.  Her respiratory      problems are complex, and she most likely has a component of  obesity hypoventilation syndrome too.  She does have congestive      cardiac failure, with an ejection fraction of 50%, which is also      contributing to her problem.  She has now been off the ventilator      for five days.  She has been using BIPAP at night while in the      hospital.  She is now back to her home oxygen level of 2 liters on      a nasal cannula.  2. Congestive cardiac failure.  She did have bilateral crackles on      admission.  Her echocardiogram did show an ejection fraction of      50%.  She was put on IV Lasix, and now she is back on her home dose      of Lasix.  She will continue to be on a beta blocker, and she will      be also on an angiotensin receptor blocker.  She will also continue      to take aspirin.  At this point of time, her volume status is      normal, and she will be discharged on the current regimen at this      point of time.  3. Obesity hypoventilation syndrome.  She probably most likely has a      component of obesity hypoventilation syndrome, and she tells me      that she has never had a sleep study in the past.  She was put on      BIPAP while in the hospital at night.  She is recommended to follow      up  with her primary doctor and also with [facility]      [facility], who will arrange for a sleep study as an outpatient.      She will need CPAP arranged in a few weeks by her primary doctor.  4. Pneumonia.  On admission, her x-ray of the chest showed      cardiomegaly and bibasilar atelectasis.  She was perceived to be      having pneumonia because she had fever and an elevated white count.      She was put on cefepime, and since then her white count has      normalized.  At this point of time, she is not on any antibiotics.  5. Chronic obstructive pulmonary disease.  She is on home oxygen at 2      liters.  Will continue this at the time of discharge.  She is      stable from that standpoint.  6. Morbid obesity.  She has been advised to lose weight.  7. Deconditioning.  Since being in the hospital, she has      deconditioned.  She is able to ambulate with assistance.  PT/OT was      consulted.  They recommended that she be placed in a skilled      nursing facility for rehab, but she did not want to go to a skilled      nursing facility, and she requested to be discharged home.  She      does have home health R.N. and PT/OT at home, and we will be      discharging her home at this point of time.   DISCHARGE MEDICATIONS:  1. Aspirin 325 mg once daily.  2. Metoprolol 25 mg twice a day.  3. Micardis 80 mg once  daily.  4. Lasix 40 mg twice daily.  5. Potassium chloride 20  mEq twice a day.  6. Requip 1 mg once daily.  7. Metformin 500 mg twice a day.  8. Lipitor 10 mg once daily.  9. Combivent 2 puffs q.6 h. as needed.  10.Xanax 0.25 mg q.4 h. as needed.  11.Oxygen by nasal cannula at 2 liters.   DISPOSITION:  She is now being discharged home in a stable condition.  She will get home health PT, OT, and R.N., and we will also arrange for  a health aide for her at home.   RECOMMENDATIONS:  She is advised to take a low-salt, lowfat diet.  She will get PT, OT at home.  She is  advised to call her doctor if she gains  3-4 pounds in 1-2 days.  She is also recommended to get a BMP in one  week's time, to be followed up with her primary doctor.  She is also  advised to call [facility] at [facility] and arrange for an  appointment for followup.  She will need to get a sleep study done as an  outpatient, and she will need to get CPAP at home.   FOLLOWUP:  1. She will follow up with her primary doctor, Dr. Syliva Overman,      in one week's time.  2. She will also follow up with [facility].  She will call for      an appointment.      Madaline Savage, MD  Electronically Signed     PKN/MEDQ  D:  08/22/2006  T:  08/22/2006  Job:  [id number]   cc:   Milus Mallick. Lodema Hong, M.D.

## 2011-01-15 NOTE — Discharge Summary (Signed)
[name], [name]               ACCOUNT NO.:  [id number]   MEDICAL RECORD NO.:  [id number]          PATIENT TYPE:  INP   LOCATION:  4741                         FACILITY:  [facility]   PHYSICIAN:  Leslye Peer, MD    DATE OF BIRTH:  11/24/1931   DATE OF ADMISSION:  07/23/2006  DATE OF DISCHARGE:  07/30/2006                               DISCHARGE SUMMARY   PRINCIPLE DISCHARGE DIAGNOSIS:  Congestive heart failure with a  Takotsubo's cardiomyopathy.   SECONDARY DISCHARGE DIAGNOSES:  Include:  1. Morbid obesity with obesity hypoventilation syndrome and probable      obstructive sleep apnea.  2. Possible right lower lobe pneumonia.  3. Hypotension in the setting of CHF and nitroglycerin therapy.  4. Non-ST elevation myocardial infarction without any evidence of      culprit lesion on cardiac catheterization.  5. Diabetes mellitus.  6. Hyperlipidemia.  7. Gastroesophageal reflux disease.  8. History of hiatal hernia.  9. Chronic venous stasis.  10.Hypertension.   DISCHARGE DIET:  The patient will begin a low-salt, low-fat, low-calorie  diet.   DISCHARGE ACTIVITY:  1. The patient is instructed to walk with assistance and increase her      activity slowly.  She will be receiving home health services with      physical therapy and occupational therapy.   DISCHARGE MEDICATIONS:  1. Coreg 6.25 mg p.o. b.i.d.  2. Lisinopril 20 mg p.o. daily.  3. Imdur 30 mg p.o. daily.  4. Aspirin 325 mg p.o. daily.  5. Lasix 40 mg p.o. b.i.d.  6. Metformin 500 mg p.o. b.i.d.  7. Ambien 12.5 mg p.o. q.h.s. p.r.n. for sleep.  8. Potassium chloride 20 mEq p.o. daily.  9. Zocor 20 mg p.o. daily.  10.Oxygen by nasal cannula at 2 liters per minute by nasal cannula.   BRIEF HOSPITAL COURSE:  [name] is a 77 year old woman with morbid  obesity, diabetes, hypertension and hyperlipidemia.  She was seen  originally in the emergency department on November 16 for dyspnea, at  which time she had a  negative VQ scan and some mild vascular congestion.  Follow-up with her primary care physician, Dr. Syliva Overman, on  November 20 showed that she had no significant improvement.  She was  given a course of levofloxacin for possible community-acquired pneumonia  versus bronchitis.  She was improving to some degree, until 3 days later  when she developed the acute onset of dyspnea and was brought to the  emergency department.  At that time, she was found to be febrile.  Her  chest x-ray showed bilateral diffuse infiltrates, but this appeared more  focality on the right, the right lower lobe, consistent with a possible  community-acquired pneumonia.  She was admitted to the ICU for further  care.  Her subsequent evaluation revealed a leukocytosis and also  positive troponins consistent with a non-ST-elevation myocardial  infarction, started on nitroglycerin and anticoagulation.  She  unfortunately became hypotensive when nitroglycerin was initiated, and  this had to be discontinued.  She was then transferred to [facility] for further care.  On arrival at [facility] on July 23, 2006, she was treated for possible resolving right lower lobe community-  acquired pneumonia, and her Heparin was continued.  Cardiology was  consulted, and gentle diuresis was initiated with significant  improvement in her shortness of breath.  Unfortunately during her  hospitalization on November 25, she developed acute onset of chest  discomfort.  Her troponins were positive, and her EKG showed  anterolateral ST elevation.  She was taken to the cardiac  catheterization lab by Dr. Excell Seltzer, and the evaluation identified mid-  anterior to mid-inferior akinesis with sparing of the basal segments and  a true apex, with an LVEF of 35%.  Also noted was non-obstructive  cardiomyopathy.  These findings were felt to be consistent with  Takotsubo's cardiomyopathy and transient vasospasm.  She was treated   with IV nitrates and started on ACE inhibitor and beta-blocker.  With  these interventions and with diuresis, she improved markedly.  She was  continued on antibiotics and completed a full course for her right lower  lobe pneumonia.  Through the rest of her hospital course, her  medications were adjusted, and her diuresis was continued on July 29, 2006.  She felt much improved and was nearing discharge.  Her Foley  was discontinued, and she was changed back to her metformin consistent  with home diabetes regimen, with plans to be discharged on December 1st  if she remains stable.  Her ambulatory oximetry on July 29, 2006  showed desaturation at 85% on room air, and she will be sent home on 2  liters per minute of oxygen by nasal cannula to wear at all times.   Her follow-up appointment will be with Dr. Syliva Overman.  She will  arrange to make this appointment in 7 to 10 days after discharge.  She  will also follow up with [facility] cardiology in [place] with Dr.  Dietrich Pates on August 10, 2006 at 8:30 in the morning.  Finally, she will  need an outpatient polysomnogram to evaluate and treat her almost  certain obstructive sleep apnea.  This will be arranged at her  outpatient follow-up appointment.      Leslye Peer, MD  Electronically Signed     RSB/MEDQ  D:  07/29/2006  T:  07/31/2006  Job:  [id number]   cc:   Milus Mallick. Lodema Hong, M.D.  Gerrit Friends. Dietrich Pates, MD, [facility]  Veverly Fells. Excell Seltzer, MD

## 2011-03-21 IMAGING — CR DG CHEST 1V PORT
1 series · 1 of 1 positions shown · non-contrast
Comparison: 03/20/08 and earlier.

CLINICAL DATA: 77-year-old female with flu like symptoms and fever.

PORTABLE CHEST - 1 VIEW

[view not recorded]
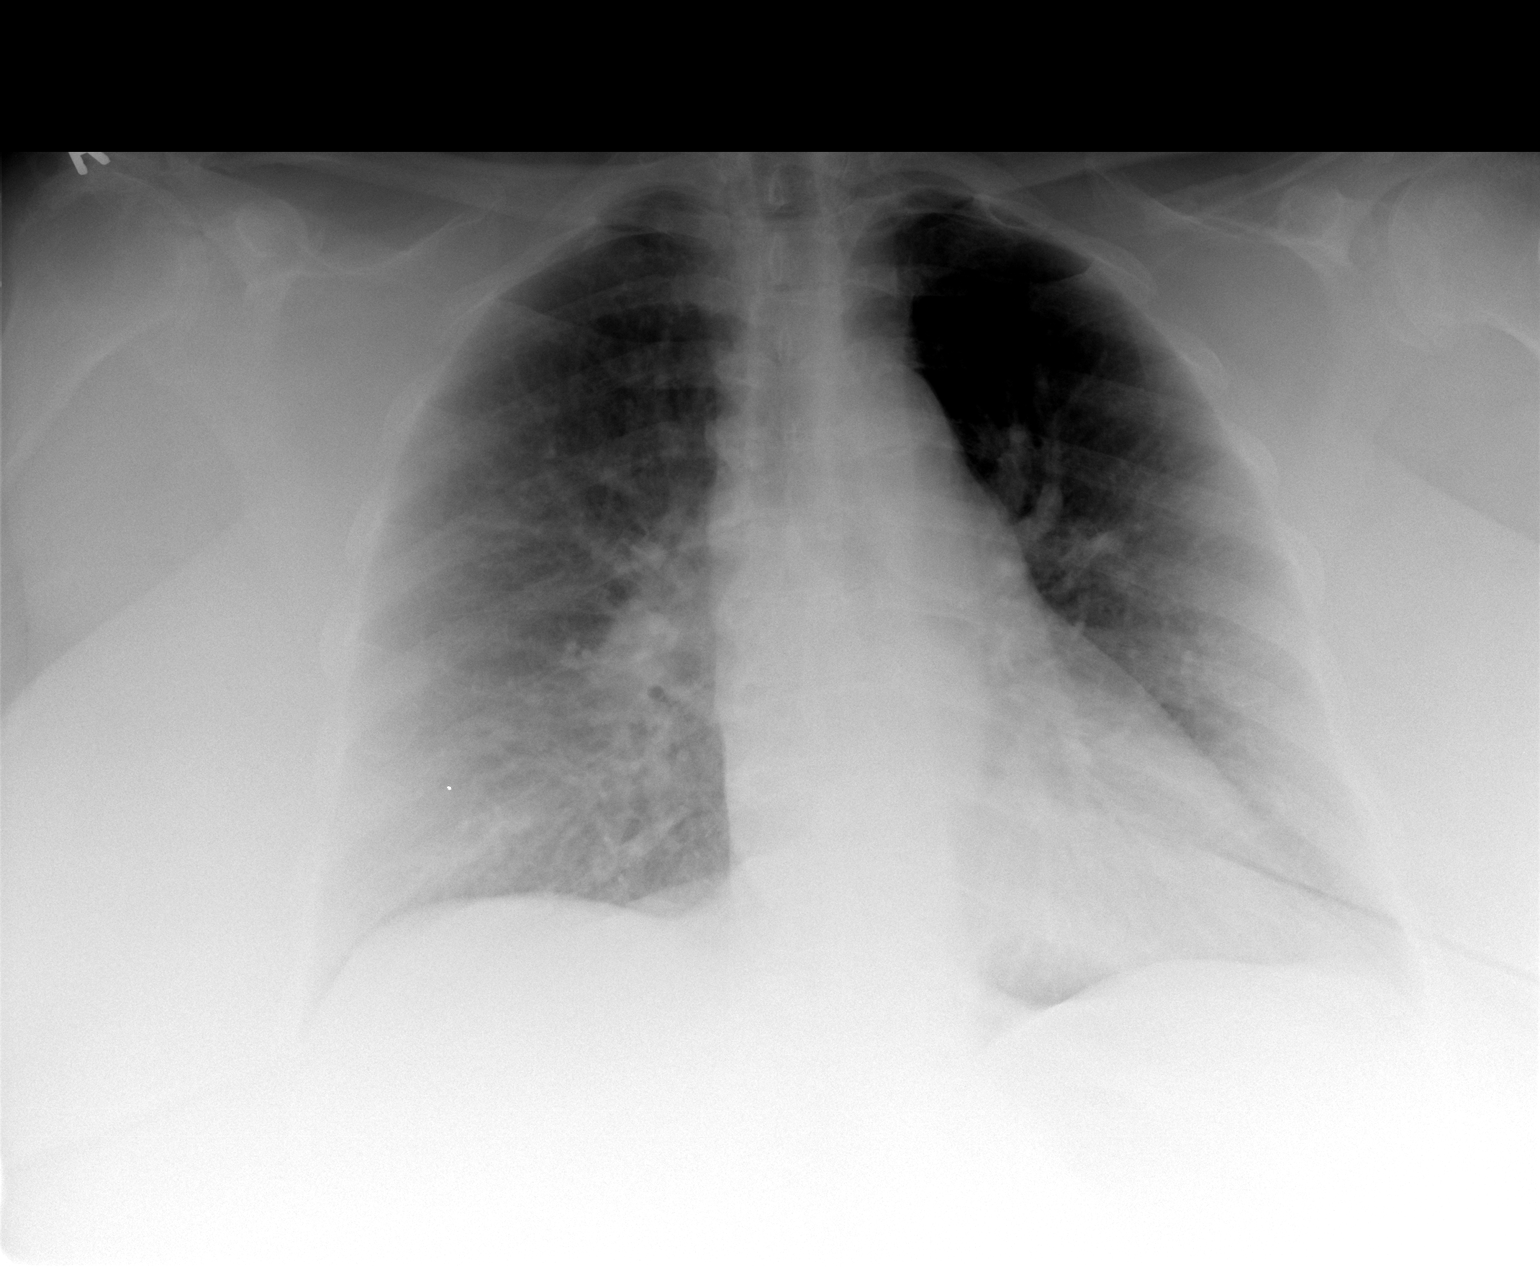

[1 of 1 positions shown; findings below may reference images not displayed]

FINDINGS: Portable AP view chest 7909 hours.  Cardiac size and
mediastinal contours are within normal limits.  Lung volumes are
within normal limits.  No pneumothorax, pulmonary edema,
consolidation, or confluent airspace opacity.  No definite
effusion.
IMPRESSION: No acute cardiopulmonary abnormality.

## 2011-05-05 ENCOUNTER — Ambulatory Visit (INDEPENDENT_AMBULATORY_CARE_PROVIDER_SITE_OTHER): Payer: Medicare Other | Admitting: Gastroenterology

## 2011-05-05 ENCOUNTER — Ambulatory Visit: Payer: Medicaid Other | Admitting: Gastroenterology

## 2011-05-05 ENCOUNTER — Encounter: Payer: Self-pay | Admitting: Gastroenterology

## 2011-05-05 DIAGNOSIS — K5900 Constipation, unspecified: Secondary | ICD-10-CM

## 2011-05-05 DIAGNOSIS — R1310 Dysphagia, unspecified: Secondary | ICD-10-CM

## 2011-05-05 NOTE — Assessment & Plan Note (Signed)
Sx improved with 1L water, Senna, and prn Miralax.

## 2011-05-05 NOTE — Assessment & Plan Note (Addendum)
Improved. Tolerating advanced diet. pT HAS peg USED FOR MEDS AND gLUCERNA bid.  Discussed with pt and daughter. She has a worn PEG that still functions. Pt declined changing tube at this time. Will need a change within the next 1-3 mos. Pt could also consider removing PEG if she can take pills and Glucerna po. OPV in 3 mos to discuss.

## 2011-05-05 NOTE — Progress Notes (Signed)
Cc to PCP 

## 2011-05-05 NOTE — Progress Notes (Signed)
Subjective:    Patient ID: [name], female    DOB: 1932/04/07, 77 y.o.   MRN: [id number]  PCP: Felecia Shelling  HPI Eatuing meats-chopped not pureed. EATING GOOD AND GAINING WEIGHT. bUILDING UP FLUID AND SO NOW GLUCERNA TWICE A DAY. sWALLOWING GOOD. Bm: 3X/DAY. Getting pills, water, and Glucerna via PEG. No problems with PEG. 1L H2O VIA PEG QD. ON SENN QHS AND MIRALAX PRN.  Past Medical History  Diagnosis Date  . GERD (gastroesophageal reflux disease)   . Dysphagia 2008    LAST BPE 2011: SEVERE EMD  . Stroke 2010    R HEMIPARESIS  . Left carotid artery occlusion   . Obesity, morbid (more than 100 lbs over ideal weight or BMI > 40)   . Diabetes mellitus   . Encounter for PEG (percutaneous endoscopic gastrostomy) SEP 2011 [facility] RADS  . Restless leg syndrome   . Hyperlipemia   . PVD (peripheral vascular disease)     Past Surgical History  Procedure Date  . Colonoscopy 2008    SIMPLE ADENOMA, bX: NEG FOR MICROSCOPIC COLITIS  . Ventral hernia repair 2005  . Upper gastrointestinal endoscopy 2005 NUR    W/ DILATATION    Allergies  Allergen Reactions  . Penicillins Itching and Rash    Current Outpatient Prescriptions  Medication Sig Dispense Refill  . acetaminophen (TYLENOL) 325 MG tablet Take 325 mg by mouth every 6 (six) hours as needed.        Marland Kitchen albuterol (PROVENTIL) (5 MG/ML) 0.5% nebulizer solution Take 2.5 mg by nebulization every 6 (six) hours as needed.        Marland Kitchen aspirin 81 MG tablet Take 81 mg by mouth daily.        . Calcium Carbonate (CALCIUM 500 PO) Take by mouth.        . Cholecalciferol 5000 UNITS capsule Take 5,000 Units by mouth daily.        . clopidogrel (PLAVIX) 75 MG tablet Take 75 mg by mouth daily.        . Fluticasone-Salmeterol (ADVAIR DISKUS) 250-50 MCG/DOSE AEPB Inhale 1 puff into the lungs every 12 (twelve) hours.        . furosemide (LASIX) 20 MG tablet Take 20 mg by mouth 2 (two) times daily.        Marland Kitchen guaiFENesin (MUCINEX) 600 MG 12 hr tablet Take by  mouth 2 (two) times daily.        . hydrOXYzine (ATARAX) 50 MG tablet Take 50 mg by mouth 3 (three) times daily as needed.        . levETIRAcetam (KEPPRA) 100 MG/ML solution Take 10 mg/kg by mouth 2 (two) times daily.        Marland Kitchen loperamide (IMODIUM A-D) 2 MG tablet Take 2 mg by mouth 4 (four) times daily as needed.        Marland Kitchen LORazepam (ATIVAN) 0.5 MG tablet Take 0.5 mg by mouth every 8 (eight) hours.        . mirtazapine (REMERON) 15 MG tablet Take 15 mg by mouth at bedtime.        . Nutritional Supplements (GLUCERNA 1.5 CAL) LIQD Take by mouth. As directed       . ondansetron (ZOFRAN) 8 MG tablet Take by mouth every 8 (eight) hours as needed.        . Pollen Extracts (PROSTAT PO) Take by mouth. 60 cc's tid       . potassium chloride SA (K-DUR,KLOR-CON) 20 MEQ tablet Take 20 mEq  by mouth 2 (two) times daily.        . promethazine (PHENERGAN) 12.5 MG tablet Take 12.5 mg by mouth every 6 (six) hours as needed.        . sennosides-docusate sodium (SENOKOT-S) 8.6-50 MG tablet Take 1 tablet by mouth daily.        . sertraline (ZOLOFT) 50 MG tablet Take 50 mg by mouth daily.        . Wheat Dextrin (BENEFIBER DRINK MIX PO) Take by mouth.        . zolmitriptan (ZOMIG) 5 MG tablet Take 5 mg by mouth as needed.        . zolpidem (AMBIEN) 5 MG tablet Take 5 mg by mouth at bedtime as needed.           Review of Systems     Objective:   Physical Exam  Vitals reviewed. Constitutional: She is oriented to person, place, and time. No distress.  HENT:  Head: Normocephalic and atraumatic.  Neck: Normal range of motion.  Cardiovascular: Normal rate, regular rhythm and normal heart sounds.   Pulmonary/Chest: Effort normal and breath sounds normal. No respiratory distress.  Abdominal: Soft. Bowel sounds are normal. She exhibits no distension. There is no tenderness.       Obese, PEG looks worn, PEG SITE NON-TENDER, NO ERYTHEMA OR EXUDATE  Neurological: She is alert and oriented to person, place, and time.           Assessment & Plan:

## 2011-05-06 NOTE — Progress Notes (Signed)
Pt is aware of OV for 12/5 at 1000 with SF

## 2011-05-28 LAB — DIFFERENTIAL
Basophils Absolute: 0
Basophils Absolute: 0.1
Basophils Relative: 0
Basophils Relative: 0
Basophils Relative: 1
Eosinophils Absolute: 0.2
Eosinophils Absolute: 0.4
Eosinophils Relative: 1
Eosinophils Relative: 4
Lymphocytes Relative: 22
Lymphocytes Relative: 30
Lymphs Abs: 2.6
Lymphs Abs: 2.9
Monocytes Absolute: 0.2
Monocytes Absolute: 0.5
Monocytes Absolute: 0.8
Monocytes Relative: 10
Monocytes Relative: 2 — ABNORMAL LOW
Monocytes Relative: 4
Neutro Abs: 11.5 — ABNORMAL HIGH
Neutro Abs: 4.9
Neutro Abs: 9.6 — ABNORMAL HIGH
Neutrophils Relative %: 56
Neutrophils Relative %: 73

## 2011-05-28 LAB — BASIC METABOLIC PANEL
BUN: 10
BUN: 16
CO2: 27
CO2: 27
CO2: 28
Calcium: 8.1 — ABNORMAL LOW
Calcium: 8.7
Calcium: 8.8
Chloride: 103
Chloride: 108
Chloride: 108
Creatinine, Ser: 0.94
Creatinine, Ser: 1.12
GFR calc Af Amer: 55 — ABNORMAL LOW
GFR calc Af Amer: 58 — ABNORMAL LOW
GFR calc Af Amer: >60
GFR calc non Af Amer: 48 — ABNORMAL LOW
GFR calc non Af Amer: 58 — ABNORMAL LOW
Glucose, Bld: 104 — ABNORMAL HIGH
Glucose, Bld: 146 — ABNORMAL HIGH
Potassium: 3.8
Potassium: 3.8
Sodium: 137
Sodium: 139
Sodium: 140

## 2011-05-28 LAB — URINE CULTURE
Colony Count: >=100000
Special Requests: NEGATIVE

## 2011-05-28 LAB — URINALYSIS, ROUTINE W REFLEX MICROSCOPIC
Glucose, UA: NEGATIVE
Leukocytes, UA: NEGATIVE
Nitrite: POSITIVE — AB
Protein, ur: NEGATIVE
Urobilinogen, UA: 0.2

## 2011-05-28 LAB — CBC
HCT: 32.9 — ABNORMAL LOW
HCT: 35.6 — ABNORMAL LOW
Hemoglobin: 10.8 — ABNORMAL LOW
Hemoglobin: 11.6 — ABNORMAL LOW
Hemoglobin: 12.1
MCHC: 32.5
MCHC: 32.7
MCHC: 32.9
MCV: 89.9
MCV: 90.1
MCV: 90.2
Platelets: 278
Platelets: 313
RBC: 3.65 — ABNORMAL LOW
RBC: 3.96
RBC: 4.12
RDW: 15.1
RDW: 15.2
WBC: 13.2 — ABNORMAL HIGH
WBC: 8.8

## 2011-05-28 LAB — B-NATRIURETIC PEPTIDE (CONVERTED LAB)
Pro B Natriuretic peptide (BNP): 56.3
Pro B Natriuretic peptide (BNP): <30

## 2011-05-28 LAB — CULTURE, BLOOD (ROUTINE X 2)

## 2011-05-28 LAB — GC/CHLAMYDIA PROBE AMP, GENITAL
Chlamydia, DNA Probe: NEGATIVE
GC Probe Amp, Genital: NEGATIVE

## 2011-05-28 LAB — WET PREP, GENITAL

## 2011-05-28 LAB — VANCOMYCIN, RANDOM: Vancomycin Rm: 14.9

## 2011-05-28 LAB — URINE MICROSCOPIC-ADD ON

## 2011-06-16 LAB — URINALYSIS, ROUTINE W REFLEX MICROSCOPIC
Glucose, UA: NEGATIVE
Ketones, ur: NEGATIVE
Nitrite: NEGATIVE
Protein, ur: NEGATIVE
Urobilinogen, UA: 0.2

## 2011-06-16 LAB — COMPREHENSIVE METABOLIC PANEL
ALT: 9
AST: 19
Alkaline Phosphatase: 66
CO2: 25
Calcium: 8.2 — ABNORMAL LOW
GFR calc Af Amer: >60
GFR calc non Af Amer: 58 — ABNORMAL LOW
Glucose, Bld: 100 — ABNORMAL HIGH
Potassium: 3.3 — ABNORMAL LOW
Sodium: 138

## 2011-06-16 LAB — CBC
Hemoglobin: 9.7 — ABNORMAL LOW
MCHC: 33
RBC: 3.28 — ABNORMAL LOW
WBC: 14.7 — ABNORMAL HIGH

## 2011-06-16 LAB — DIFFERENTIAL
Basophils Relative: 0
Eosinophils Relative: 3
Metamyelocytes Relative: 0
Myelocytes: 0
Promyelocytes Absolute: 0
nRBC: 0

## 2011-06-16 LAB — URINE CULTURE

## 2011-06-16 LAB — URINE MICROSCOPIC-ADD ON

## 2011-06-17 LAB — URINALYSIS, ROUTINE W REFLEX MICROSCOPIC
Bilirubin Urine: NEGATIVE
Glucose, UA: NEGATIVE
Hgb urine dipstick: NEGATIVE
Ketones, ur: NEGATIVE
Nitrite: POSITIVE — AB
Protein, ur: NEGATIVE
Specific Gravity, Urine: 1.01
Urobilinogen, UA: 0.2
pH: 5.5

## 2011-06-17 LAB — URINE MICROSCOPIC-ADD ON

## 2011-06-17 LAB — URINE CULTURE

## 2011-07-08 ENCOUNTER — Other Ambulatory Visit: Payer: Self-pay | Admitting: Gastroenterology

## 2011-07-08 ENCOUNTER — Ambulatory Visit (INDEPENDENT_AMBULATORY_CARE_PROVIDER_SITE_OTHER): Payer: Medicare Other | Admitting: Gastroenterology

## 2011-07-08 ENCOUNTER — Encounter: Payer: Self-pay | Admitting: Gastroenterology

## 2011-07-08 DIAGNOSIS — K5900 Constipation, unspecified: Secondary | ICD-10-CM

## 2011-07-08 DIAGNOSIS — R634 Abnormal weight loss: Secondary | ICD-10-CM

## 2011-07-08 DIAGNOSIS — K9423 Gastrostomy malfunction: Secondary | ICD-10-CM

## 2011-07-08 NOTE — Assessment & Plan Note (Signed)
ANOREXIA-USING PEG FOR NUTRITION. PEG WORN.  PEG CHANGE 11/12. DISCUSSED PROCEDURE WITH PT AND DAUGHTER. PT NEEDS TO COME BY AMBULANCE BECAUSE SHE CANNOT TRANSFER 2o TO HER CVA.

## 2011-07-08 NOTE — Progress Notes (Signed)
Cc to PCP 

## 2011-07-08 NOTE — Assessment & Plan Note (Signed)
IMPROVED.  CONTINUE SENNA AND BENEFIBER. OPV IN 4 MOS.

## 2011-07-08 NOTE — Progress Notes (Signed)
Reminder in epic to follow up in 4 months E30 visit with SF/constipation,peg tube

## 2011-07-08 NOTE — Patient Instructions (Signed)
PEG CHANGE 11/12 AT 0900. CONTINUE BENEFIBER & SENNA. FOLLOW UP IN 4 MOS.

## 2011-07-08 NOTE — Progress Notes (Signed)
Subjective:    Patient ID: [name], female    DOB: 10/16/1931, 77 y.o.   MRN: [id number]  PCP: [facility]  HPI HASN'T EATEN IN 3 DAYS.  STARTED WITH A UTI. TOLERATING ENSURE. PEG TUBE WORN AND TURNED BLACK. NOW ON SENNA-KOT AND BENEFIBER TO HELP BOWELS MOVE. PT DOES TAKE CaCO3. BMS BETTER.  Past Medical History  Diagnosis Date  . GERD (gastroesophageal reflux disease)   . Dysphagia 2008    LAST BPE 2011: SEVERE EMD  . Stroke 2010    R HEMIPARESIS  . Left carotid artery occlusion   . Obesity, morbid (more than 100 lbs over ideal weight or BMI > 40)   . Diabetes mellitus   . Encounter for PEG (percutaneous endoscopic gastrostomy) SEP 2011 [facility] RADS  . Restless leg syndrome   . Hyperlipemia   . PVD (peripheral vascular disease)     Past Surgical History  Procedure Date  . Colonoscopy 2008    SIMPLE ADENOMA, bX: NEG FOR MICROSCOPIC COLITIS  . Ventral hernia repair 2005  . Upper gastrointestinal endoscopy 2005 NUR    W/ DILATATION    Allergies  Allergen Reactions  . Penicillins Itching and Rash    Current Outpatient Prescriptions  Medication Sig Dispense Refill  . acetaminophen (TYLENOL) 325 MG tablet Take 325 mg by mouth every 6 (six) hours as needed.        Marland Kitchen albuterol (PROVENTIL) (5 MG/ML) 0.5% nebulizer solution Take 2.5 mg by nebulization every 6 (six) hours as needed.        Marland Kitchen aspirin 81 MG tablet Take 81 mg by mouth daily.        . Calcium Carbonate (CALCIUM 500 PO) Take by mouth.        . Cholecalciferol 5000 UNITS capsule Take 5,000 Units by mouth daily.        . clopidogrel (PLAVIX) 75 MG tablet Take 75 mg by mouth daily.        . Fluticasone-Salmeterol (ADVAIR DISKUS) 250-50 MCG/DOSE AEPB Inhale 1 puff into the lungs every 12 (twelve) hours.        . furosemide (LASIX) 20 MG tablet Take 20 mg by mouth 2 (two) times daily.        Marland Kitchen guaiFENesin (MUCINEX) 600 MG 12 hr tablet Take by mouth 2 (two) times daily.        . hydrOXYzine (ATARAX) 50 MG tablet Take 50  mg by mouth 3 (three) times daily as needed.        . levETIRAcetam (KEPPRA) 100 MG/ML solution Take 10 mg/kg by mouth 2 (two) times daily.        Marland Kitchen loperamide (IMODIUM A-D) 2 MG tablet Take 2 mg by mouth 4 (four) times daily as needed.        Marland Kitchen LORazepam (ATIVAN) 0.5 MG tablet Take 0.5 mg by mouth every 8 (eight) hours.        . mirtazapine (REMERON) 15 MG tablet Take 15 mg by mouth at bedtime.        . Nutritional Supplements (GLUCERNA 1.5 CAL) LIQD Take by mouth. As directed       . ondansetron (ZOFRAN) 8 MG tablet Take by mouth every 8 (eight) hours as needed.        . Pollen Extracts (PROSTAT PO) Take by mouth. 60 cc's tid       . potassium chloride SA (K-DUR,KLOR-CON) 20 MEQ tablet Take 20 mEq by mouth 2 (two) times daily.        Marland Kitchen  promethazine (PHENERGAN) 12.5 MG tablet Take 12.5 mg by mouth every 6 (six) hours as needed.        . sennosides-docusate sodium (SENOKOT-S) 8.6-50 MG tablet Take 1 tablet by mouth daily.        . sertraline (ZOLOFT) 50 MG tablet Take 50 mg by mouth daily.        . Wheat Dextrin (BENEFIBER DRINK MIX PO) Take by mouth.        . zolmitriptan (ZOMIG) 5 MG tablet Take 5 mg by mouth as needed.        . zolpidem (AMBIEN) 5 MG tablet Take 5 mg by mouth at bedtime as needed.            Review of Systems     Objective:   Physical Exam  Vitals reviewed. Constitutional: She is oriented to person, place, and time. No distress.  HENT:  Head: Normocephalic and atraumatic.  Cardiovascular: Normal rate, regular rhythm and normal heart sounds.   Pulmonary/Chest: Effort normal and breath sounds normal.  Abdominal: Soft. Bowel sounds are normal. She exhibits no distension. There is no tenderness.       PEG LUMEN BLACK  Neurological: She is alert and oriented to person, place, and time.       NO NEW FOCAL DEFICITS           Assessment & Plan:

## 2011-07-09 ENCOUNTER — Encounter (HOSPITAL_COMMUNITY): Payer: Self-pay | Admitting: Pharmacy Technician

## 2011-07-12 ENCOUNTER — Ambulatory Visit (HOSPITAL_COMMUNITY)
Admission: RE | Admit: 2011-07-12 | Discharge: 2011-07-12 | Disposition: A | Discharge status: Home / Self Care | Payer: PRIVATE HEALTH INSURANCE | Source: Ambulatory Visit | Attending: Gastroenterology | Admitting: Gastroenterology

## 2011-07-12 ENCOUNTER — Encounter (HOSPITAL_COMMUNITY)
Admission: RE | Disposition: A | Discharge status: Home / Self Care | Payer: Self-pay | Source: Ambulatory Visit | Attending: Gastroenterology

## 2011-07-12 ENCOUNTER — Encounter (HOSPITAL_COMMUNITY): Payer: Self-pay | Admitting: *Deleted

## 2011-07-12 DIAGNOSIS — K9423 Gastrostomy malfunction: Secondary | ICD-10-CM | POA: Insufficient documentation

## 2011-07-12 SURGERY — REPLACEMENT, PEG TUBE, WITHOUT ENDOSCOPY
Anesthesia: LOCAL

## 2011-07-12 NOTE — Procedures (Signed)
04/14/2011  1:38 PM  PATIENT:  [name]  77 y.o. female  PRE-OPERATIVE DIAGNOSIS:  peg tube MALFUNCTION  POST-OPERATIVE DIAGNOSIS:  PEG TUBE REPLACEMENT  PROCEDURE:  Procedure(s): PERCUTANEOUS ENDOSCOPIC GASTROSTOMY (PEG) RELACEMENT  SURGEON:  Surgeon(s): Arlyce Harman, MD  Procedure technique: Pt  prepped and draped. PEG tube worn. Tube removed WITH TENSION. Tube replaced with 20 FR [employer] PEG. 5 CC STERILE WATER INFUSED INTO BALLOON. ALL PORTS FLUSHED AND ASPIRATED. BS HEARD OVER EPIGASTRIUM. Pt tolerated procedure well.  PLAN: 1. RESUME TUBE FEEDS AND DIET.

## 2011-07-12 NOTE — Interval H&P Note (Signed)
History and Physical Interval Note:   07/12/2011   9:17 AM   [name]  has presented today for surgery, with the diagnosis of peg tube worn & black  The various methods of treatment have been discussed with the patient and family. After consideration of risks, benefits and other options for treatment, the patient has consented to  Procedure(s): PERCUTANEOUS ENDOSCOPIC GASTROSTOMY (PEG) REPLACEMENT as a surgical intervention .  The patients' history has been reviewed, patient examined, no change in status, stable for surgery.  I have reviewed the patients' chart and labs.  Questions were answered to the patient's satisfaction.     Jonette Eva  MD  THE PATIENT WAS EXAMINED AND THERE IS NO CHANGE IN THE PATIENT'S CONDITION SINCE THE ORIGINAL H&P WAS COMPLETED.\

## 2011-07-12 NOTE — H&P (Signed)
Reason for Visit     Follow-up        Vitals - Last Recorded       BP Pulse Temp(Src) Ht Wt BMI    95/60  70  98.1 F (36.7 C) (Temporal)  5\' 8"  (1.727 m)  255 lb (115.667 kg)  38.77 kg/m2       Progress Notes     Jonette Eva, MD  07/08/2011  1:01 PM  Signed    Subjective:      Patient ID: [name], female    DOB: [birth year], 77 y.o.   MRN: [id number]   PCP: [facility]   HPI HASN'T EATEN IN 3 DAYS.  STARTED WITH A UTI. TOLERATING ENSURE. PEG TUBE WORN AND TURNED BLACK. NOW ON SENNA-KOT AND BENEFIBER TO HELP BOWELS MOVE. PT DOES TAKE CaCO3. BMS BETTER.    Past Medical History   Diagnosis  Date   .  GERD (gastroesophageal reflux disease)     .  Dysphagia  2008       LAST BPE 2011: SEVERE EMD   .  Stroke  2010       R HEMIPARESIS   .  Left carotid artery occlusion     .  Obesity, morbid (more than 100 lbs over ideal weight or BMI > 40)     .  Diabetes mellitus     .  Encounter for PEG (percutaneous endoscopic gastrostomy)  SEP 2011 [facility] RADS   .  Restless leg syndrome     .  Hyperlipemia     .  PVD (peripheral vascular disease)         Past Surgical History   Procedure  Date   .  Colonoscopy  2008       SIMPLE ADENOMA, bX: NEG FOR MICROSCOPIC COLITIS   .  Ventral hernia repair  2005   .  Upper gastrointestinal endoscopy  2005 NUR       W/ DILATATION       Allergies   Allergen  Reactions   .  Penicillins  Itching and Rash       Current Outpatient Prescriptions   Medication  Sig  Dispense  Refill   .  acetaminophen (TYLENOL) 325 MG tablet  Take 325 mg by mouth every 6 (six) hours as needed.           Marland Kitchen  albuterol (PROVENTIL) (5 MG/ML) 0.5% nebulizer solution  Take 2.5 mg by nebulization every 6 (six) hours as needed.           Marland Kitchen  aspirin 81 MG tablet  Take 81 mg by mouth daily.           .  Calcium Carbonate (CALCIUM 500 PO)  Take by mouth.           .  Cholecalciferol 5000 UNITS capsule  Take 5,000 Units by mouth daily.           .  clopidogrel  (PLAVIX) 75 MG tablet  Take 75 mg by mouth daily.           .  Fluticasone-Salmeterol (ADVAIR DISKUS) 250-50 MCG/DOSE AEPB  Inhale 1 puff into the lungs every 12 (twelve) hours.           .  furosemide (LASIX) 20 MG tablet  Take 20 mg by mouth 2 (two) times daily.           Marland Kitchen  guaiFENesin (MUCINEX) 600 MG 12 hr tablet  Take  by mouth 2 (two) times daily.           .  hydrOXYzine (ATARAX) 50 MG tablet  Take 50 mg by mouth 3 (three) times daily as needed.           .  levETIRAcetam (KEPPRA) 100 MG/ML solution  Take 10 mg/kg by mouth 2 (two) times daily.           Marland Kitchen  loperamide (IMODIUM A-D) 2 MG tablet  Take 2 mg by mouth 4 (four) times daily as needed.           Marland Kitchen  LORazepam (ATIVAN) 0.5 MG tablet  Take 0.5 mg by mouth every 8 (eight) hours.           .  mirtazapine (REMERON) 15 MG tablet  Take 15 mg by mouth at bedtime.           .  Nutritional Supplements (GLUCERNA 1.5 CAL) LIQD  Take by mouth. As directed          .  ondansetron (ZOFRAN) 8 MG tablet  Take by mouth every 8 (eight) hours as needed.           .  Pollen Extracts (PROSTAT PO)  Take by mouth. 60 cc's tid          .  potassium chloride SA (K-DUR,KLOR-CON) 20 MEQ tablet  Take 20 mEq by mouth 2 (two) times daily.           .  promethazine (PHENERGAN) 12.5 MG tablet  Take 12.5 mg by mouth every 6 (six) hours as needed.           .  sennosides-docusate sodium (SENOKOT-S) 8.6-50 MG tablet  Take 1 tablet by mouth daily.           .  sertraline (ZOLOFT) 50 MG tablet  Take 50 mg by mouth daily.           .  Wheat Dextrin (BENEFIBER DRINK MIX PO)  Take by mouth.           .  zolmitriptan (ZOMIG) 5 MG tablet  Take 5 mg by mouth as needed.           .  zolpidem (AMBIEN) 5 MG tablet  Take 5 mg by mouth at bedtime as needed.                  Review of Systems     Objective:    Physical Exam  Vitals reviewed. Constitutional: She is oriented to person, place, and time. No distress.  HENT:   Head: Normocephalic and atraumatic.    Cardiovascular: Normal rate, regular rhythm and normal heart sounds.   Pulmonary/Chest: Effort normal and breath sounds normal.  Abdominal: Soft. Bowel sounds are normal. She exhibits no distension. There is no tenderness.       PEG LUMEN BLACK  Neurological: She is alert and oriented to person, place, and time.       NO NEW FOCAL DEFICITS            Assessment & Plan:        [name]  07/08/2011  2:59 PM  Signed Cc to PCP  Ferne Reus  07/08/2011  4:26 PM  Signed Reminder in epic to follow up in 4 months E30 visit with SF/constipation,peg tube     CONSTIPATION - Jonette Eva, MD  07/08/2011 12:59 PM  Signed IMPROVED.   CONTINUE SENNA AND BENEFIBER. OPV IN 4  MOS.  WEIGHT LOSS - Jonette Eva, MD  07/08/2011  1:00 PM  Signed ANOREXIA-USING PEG FOR NUTRITION. PEG WORN.   PEG CHANGE 11/12. DISCUSSED PROCEDURE WITH PT AND DAUGHTER. PT NEEDS TO COME BY AMBULANCE BECAUSE SHE CANNOT TRANSFER 2o TO HER CVA.

## 2011-07-16 ENCOUNTER — Encounter (HOSPITAL_COMMUNITY): Payer: Self-pay | Admitting: Gastroenterology

## 2011-08-04 ENCOUNTER — Ambulatory Visit: Payer: PRIVATE HEALTH INSURANCE | Admitting: Gastroenterology

## 2011-12-23 ENCOUNTER — Encounter: Payer: Self-pay | Admitting: Gastroenterology

## 2011-12-23 ENCOUNTER — Ambulatory Visit (INDEPENDENT_AMBULATORY_CARE_PROVIDER_SITE_OTHER): Payer: Medicare Other | Admitting: Gastroenterology

## 2011-12-23 VITALS — BP 130/80 | HR 72 | Temp 96.4°F

## 2011-12-23 DIAGNOSIS — R1110 Vomiting, unspecified: Secondary | ICD-10-CM | POA: Insufficient documentation

## 2011-12-23 NOTE — Progress Notes (Signed)
Reminder in epic to follow up in 6 months in E30 with SF 

## 2011-12-23 NOTE — Progress Notes (Signed)
Faxed to PCP

## 2011-12-23 NOTE — Assessment & Plan Note (Addendum)
RESOLVED. MAY HAVE DELAYED GASTRIC EMPTYING DUE TO DIABETES. PT NOT A CANDIDATE FOR REGLAN.  CONTINUE CONTINUOUS FEEDS. CHECK HFP. FAX RESULTS TO [id number]. ZOFRAN AS NEEDED FOR NAUSEA OR VOMITING. OPV IN 6 MOS.

## 2011-12-23 NOTE — Progress Notes (Signed)
Subjective:    Patient ID: [name], female    DOB: January 30, 1932, 78 y.o.   MRN: [id number]  PCP: [facility]  HPI PT BROUGHT IN BY EMS. PT HAVING TROUBLE WITH VOMITING AFTER BOLUS FEEDS. STARTED CONTINUOUS FEEDS AND NOW NO VOMITING FOR A WEEK. Pt not taking anything po for 3 mos. ? Concern for gallstones for an unknown reason. LAST CT 2011: NO GALLSTONES, GB NL. Peg ok. NO ABDOMINAL PAIN OR DIARRHEA. FAMILY REPORTS SKIN SCALY DUE TO NURTITIONAL ISSUES.  Past Medical History  Diagnosis Date  . GERD (gastroesophageal reflux disease)   . Dysphagia 2008    LAST BPE 2011: SEVERE EMD  . Stroke 2010    R HEMIPARESIS  . Left carotid artery occlusion   . Obesity, morbid (more than 100 lbs over ideal weight or BMI > 40)   . Diabetes mellitus   . Encounter for PEG (percutaneous endoscopic gastrostomy) SEP 2011 [facility] RADS  . Restless leg syndrome   . Hyperlipemia   . PVD (peripheral vascular disease)   . Asthma     Past Surgical History  Procedure Date  . Colonoscopy 2008    SIMPLE ADENOMA, bX: NEG FOR MICROSCOPIC COLITIS  . Ventral hernia repair 2005  . Upper gastrointestinal endoscopy 2005 NUR    W/ DILATATION  . Peg placement 07/12/2011    Procedure: PERCUTANEOUS ENDOSCOPIC GASTROSTOMY (PEG) REPLACEMENT;  Surgeon: Arlyce Harman, MD;  Location: [facility] SUITE;  Service: Endoscopy;  Laterality: N/A;  9:30   Allergies  Allergen Reactions  . Penicillins Itching and Rash    Current Outpatient Prescriptions  Medication Sig Dispense Refill  . acetaminophen (TYLENOL) 325 MG tablet Take 650 mg by mouth every 4 (four) hours as needed. For pain      . aspirin 81 MG tablet Take 81 mg by mouth daily.        . Calcium Carbonate (CALCIUM 500 PO) Take 1 tablet by mouth daily.       . Cholecalciferol 5000 UNITS capsule Take 5,000 Units by mouth daily.        . clopidogrel (PLAVIX) 75 MG tablet Take 75 mg by mouth daily.        . feeding supplement (PRO-STAT SUGAR FREE 64) LIQD Take 60 mLs by  mouth 2 (two) times daily.       . Fluticasone-Salmeterol (ADVAIR DISKUS) 250-50 MCG/DOSE AEPB Inhale 1 puff into the lungs every 12 (twelve) hours.        . furosemide (LASIX) 20 MG tablet Take 20 mg by mouth daily. Takes a 40 mg and 20 mg to make 60 mg      . furosemide (LASIX) 40 MG tablet Take 40 mg by mouth daily. Takes a 20 mg and a 40 mg to make a 60 mg       . guaiFENesin (MUCINEX) 600 MG 12 hr tablet Take by mouth 2 (two) times daily.        . hydrOXYzine (ATARAX/VISTARIL) 25 MG tablet Take 25 mg by mouth daily as needed.        . levETIRAcetam (KEPPRA) 100 MG/ML solution Take 250 mg by mouth 2 (two) times daily. Take 2.5 mls orally twice a day.      . loperamide (IMODIUM A-D) 2 MG tablet Take 2 mg by mouth 4 (four) times daily as needed. diarrhea      . LORazepam (ATIVAN) 0.5 MG tablet Take 0.5 mg by mouth every 8 (eight) hours. anxiety      .  mirtazapine (REMERON) 15 MG tablet Take 15 mg by mouth at bedtime.        . Multiple Vitamins-Minerals (MULTIVITAMINS THER. W/MINERALS) TABS Take 1 tablet by mouth daily.        . Nutritional Supplements (GLUCERNA 1.5 CAL) LIQD Take 1 Can by mouth daily.       Marland Kitchen omeprazole (PRILOSEC) 20 MG capsule Take 20 mg by mouth daily.        . ondansetron (ZOFRAN) 8 MG tablet Take 8 mg by mouth every 8 (eight) hours as needed. Nausea,vomiting      . polyethylene glycol (MIRALAX / GLYCOLAX) packet Take 17 g by mouth daily as needed. For constipation       . potassium chloride SA (K-DUR,KLOR-CON) 20 MEQ tablet Take 20 mEq by mouth 2 (two) times daily.        . promethazine (PHENERGAN) 12.5 MG tablet Take 12.5 mg by mouth every 6 (six) hours as needed. Nausea/vomiting      . sennosides-docusate sodium (SENOKOT-S) 8.6-50 MG tablet Take 1 tablet by mouth daily.        . sertraline (ZOLOFT) 50 MG tablet Take 50 mg by mouth daily.        Marland Kitchen zolmitriptan (ZOMIG) 5 MG tablet Take 5 mg by mouth as needed. For migraines      . zolpidem (AMBIEN) 5 MG tablet Take 2.5 mg  by mouth at bedtime as needed. For sleep         Review of Systems     Objective:   Physical Exam  Vitals reviewed. Constitutional: She is oriented to person, place, and time. No distress.  HENT:  Head: Normocephalic and atraumatic.  Neck: Normal range of motion. Neck supple.  Cardiovascular: Normal rate and normal heart sounds.   Pulmonary/Chest: Effort normal and breath sounds normal. No respiratory distress.  Abdominal: Soft. Bowel sounds are normal. She exhibits no distension.       PEG SITE DRY, NO ERYTHEMA. TUBE SLIGHTLY WORN.  Neurological: She is alert and oriented to person, place, and time.       NO  NEW FOCAL DEFICITS  R HEMIPARESIS   Psychiatric: She has a normal mood and affect.          Assessment & Plan:

## 2011-12-29 ENCOUNTER — Ambulatory Visit: Payer: PRIVATE HEALTH INSURANCE | Admitting: Gastroenterology

## 2011-12-31 ENCOUNTER — Emergency Department (HOSPITAL_COMMUNITY)
Admission: EM | Admit: 2011-12-31 | Discharge: 2011-12-31 | Disposition: A | Discharge status: Skilled Nursing Facility | Payer: PRIVATE HEALTH INSURANCE | Attending: Emergency Medicine | Admitting: Emergency Medicine

## 2011-12-31 ENCOUNTER — Encounter (HOSPITAL_COMMUNITY): Payer: Self-pay | Admitting: Emergency Medicine

## 2011-12-31 ENCOUNTER — Emergency Department (HOSPITAL_COMMUNITY): Payer: PRIVATE HEALTH INSURANCE

## 2011-12-31 ENCOUNTER — Encounter: Payer: Self-pay | Admitting: Gastroenterology

## 2011-12-31 DIAGNOSIS — K9429 Other complications of gastrostomy: Secondary | ICD-10-CM | POA: Insufficient documentation

## 2011-12-31 DIAGNOSIS — K9423 Gastrostomy malfunction: Secondary | ICD-10-CM

## 2011-12-31 DIAGNOSIS — I69998 Other sequelae following unspecified cerebrovascular disease: Secondary | ICD-10-CM | POA: Insufficient documentation

## 2011-12-31 DIAGNOSIS — R109 Unspecified abdominal pain: Secondary | ICD-10-CM | POA: Insufficient documentation

## 2011-12-31 DIAGNOSIS — K219 Gastro-esophageal reflux disease without esophagitis: Secondary | ICD-10-CM | POA: Insufficient documentation

## 2011-12-31 DIAGNOSIS — G2581 Restless legs syndrome: Secondary | ICD-10-CM | POA: Insufficient documentation

## 2011-12-31 DIAGNOSIS — J45909 Unspecified asthma, uncomplicated: Secondary | ICD-10-CM | POA: Insufficient documentation

## 2011-12-31 DIAGNOSIS — E785 Hyperlipidemia, unspecified: Secondary | ICD-10-CM | POA: Insufficient documentation

## 2011-12-31 DIAGNOSIS — K9420 Gastrostomy complication, unspecified: Secondary | ICD-10-CM

## 2011-12-31 DIAGNOSIS — E119 Type 2 diabetes mellitus without complications: Secondary | ICD-10-CM | POA: Insufficient documentation

## 2011-12-31 DIAGNOSIS — Y849 Medical procedure, unspecified as the cause of abnormal reaction of the patient, or of later complication, without mention of misadventure at the time of the procedure: Secondary | ICD-10-CM | POA: Insufficient documentation

## 2011-12-31 DIAGNOSIS — R29898 Other symptoms and signs involving the musculoskeletal system: Secondary | ICD-10-CM | POA: Insufficient documentation

## 2011-12-31 NOTE — ED Notes (Signed)
[facility] will call back with size/type of PEG tube.

## 2011-12-31 NOTE — Discharge Instructions (Signed)
PLEASE RETURN FOR ANY FEVER, VOMITING OR ABDOMINAL PAIN OVER THE NEXT 12 HOURSCare of a Feeding Tube Site Individuals who have trouble swallowing or cannot take food or medication by mouth are sometimes given feeding tubes. A feeding tube can go into the nose and down to the stomach or through the skin in the abdomen and into the stomach or small bowel. Some of the names of these feeding tubes are gastrostomy tubes, PEG lines, nasogastric tubes, and gastrojejunostomy tubes.  Liquids or foods that have been made into a thick, smooth soup consistency (pureed), along with medications, may be given through the tube. There are several ways to give the liquid food (formula), and there are several kinds of prescribed formulas. Your caregiver will arrange for you to get nutrition in the right amounts for you. EQUIPMENT NEEDED FOR TUBE FEEDING  A 60 mL syringe.   Formula suggested by your caregiver or dietician.   A specific plug to put into the feeding tube tip during the times when you are not getting food or medications.   A feeding pump or a place to hang your food container (an IV pole or a wall hook) so it can operate by gravity while you are getting your feedings. You attach the tube from the food container to the end of the feeding tube. Your caregivers will help you with these supplies or tell you where to get them.  PROCEDURE FOR TUBE FEEDING 1. Wash your hands before touching the equipment, food, medications, or the site (where the tube enters your body).  2. Check the tube placement before starting the feeding by removing the plug in the end of the feeding tube and attaching a syringe to the feeding tube. Pull the plunger back and you will usually see some yellowish or greenish fluid. This tells you the tube is in the correct place. Note the amount and gently push the residual back into the tube.  Ask your caregiver if there are instances when you would not start tube feedings depending on the  amount or type of contents withdrawn from the stomach.   If gastrointestinal (GI) contents do not show when you pull back on the plunger, measure the length from the stoma site (the opening in your body made for feeding) to the end of your feeding tube. If this different from the previous measurement, you should call your caregiver.   If at any point the feeding tube seems blocked, use your syringe and pull back on the plunger. If this does not work, try to gently force some warm water through the tube. If none of these methods work, call your caregiver. It is important not to miss your medications, food, or water.  3. If everything with the tube seems to be okay, insert the tip of the tube from the food container into your feeding tube.  4. While sitting up or with your head propped up at least 30 degrees to 45 degrees, start the feeding. If you develop coughing or have trouble breathing, stop the feeding immediately.  5. Administer the feeding for the length of time instructed by your caregiver.  6. To keep the tube open, following the feeding, flush the tube with 30 mLs of water using a syringe, and replace the plug at the end of the feeding tube.  7. Do not leave unused food out between feedings. Refrigerate or store it as directed.  GIVING YOUR MEDICATIONS THROUGH THE TUBE  Use liquid forms of your medications, if  available.   Some pills or tablets may be crushed and put into warm water. Do not use hot water, which could affect the contents.   Ask the pharmacist if you can crush your pills. Do not crush capsules that have SR (sustained release), XR (extended release), or CD (controlled release) printed on them unless your caregiver or pharmacist says it is okay.   After you have crushed your pills or capsules into small pieces or a powder, let the pieces dissolve in warm (not hot) water so that no pieces will clog your tube. Draw medication up into your syringe by pulling back on the plunger.     Attach the syringe to the end of the feeding tube and push on the plunger to give your medication.   Flush the tube with 30 mLs of water after giving your medication. This makes sure you have received all of your medications.  CARE OF THE SKIN AROUND THE ENTRANCE OF THE TUBE  Check the skin daily where the tube enters the abdomen for redness, irritation, drainage, or tenderness.   Clean around the tube daily with water or soap and water using cotton-tipped applicators or gauze squares. Dry completely after cleaning.   Change the dressing around the tube insertion site daily or as instructed.   Apply antibiotic ointment around the site, if directed by your caregiver.   If the dressing becomes wet or soiled, change it as soon as convenient.   You may use tape to fasten your feeding tube to your skin for comfort or do as directed.  SEEK IMMEDIATE MEDICAL CARE IF:   You develop coughing or have trouble breathing during a feeding. Stop the feeding and call your caregiver immediately.   You notice swelling, redness, drainage, or tenderness at the tube insertion site.   You develop pain,nausea, vomiting, diarrhea, constipation, or bleeding around the tube insertion site.   The tube seems plugged and you are unable to get water through the tube.   There is food leaking from around the tube.   The tube falls out.  Document Released: 08/16/2005 Document Revised: 08/05/2011 Document Reviewed: 11/11/2006 [facility] Patient Information 2012 [place], [place].

## 2011-12-31 NOTE — ED Provider Notes (Signed)
History   This chart was scribed for Samantha Gaskins, MD by Shari Heritage. The patient was seen in room APA07/[facility]07. Patient's care was started at 1111.   CSN: [id number]  Arrival date & time 12/31/11  1111   First MD Initiated Contact with Patient 12/31/11 1206      Chief Complaint  Patient presents with  . Abdominal Pain     HPI [name] is a 78 y.o. female who presents to the Emergency Department complaining of removal of a PEG tube from abdomen with associated mild abdominal pain which occurred this morning. Patient has had the current feeding tube for 1+ year and family member states tube was last changed 6 months ago. Patient denies vomiting, nausea, diarrhea, coughing, fever, and chills. Patient with h/o of CVA with residual weakness of right side, diabetes, HLD, PVD, asthma.  PCP - Selena Batten  Past Medical History  Diagnosis Date  . GERD (gastroesophageal reflux disease)   . Dysphagia 2008    LAST BPE 2011: SEVERE EMD  . Stroke 2010    R HEMIPARESIS  . Left carotid artery occlusion   . Obesity, morbid (more than 100 lbs over ideal weight or BMI > 40)   . Diabetes mellitus   . Encounter for PEG (percutaneous endoscopic gastrostomy) SEP 2011 [facility] RADS  . Restless leg syndrome   . Hyperlipemia   . PVD (peripheral vascular disease)   . Asthma     Past Surgical History  Procedure Date  . Colonoscopy 2008    SIMPLE ADENOMA, bX: NEG FOR MICROSCOPIC COLITIS  . Ventral hernia repair 2005  . Upper gastrointestinal endoscopy 2005 NUR    W/ DILATATION  . Peg placement 07/12/2011    20 FR BUMPER PEG    History reviewed. No pertinent family history.  History  Substance Use Topics  . Smoking status: Former Games developer  . Smokeless tobacco: Not on file  . Alcohol Use: No    OB History    Grav Para Term Preterm Abortions TAB SAB Ect Mult Living                  Review of Systems A complete 10 system review of systems was obtained and all systems are negative except as  noted in the HPI and PMH.   Allergies  Penicillins  Home Medications   Current Outpatient Rx  Name Route Sig Dispense Refill  . ACETAMINOPHEN 325 MG PO TABS Oral Take 650 mg by mouth every 4 (four) hours as needed. For pain    . ASPIRIN 81 MG PO TABS Oral Take 81 mg by mouth daily.      Marland Kitchen CALCIUM 500 PO Oral Take 1 tablet by mouth daily.     . CHOLECALCIFEROL 5000 UNITS PO CAPS Oral Take 5,000 Units by mouth daily.      Marland Kitchen CLOPIDOGREL BISULFATE 75 MG PO TABS Oral Take 75 mg by mouth daily.      Marland Kitchen PRO-STAT 64 PO LIQD Oral Take 60 mLs by mouth 2 (two) times daily.     Marland Kitchen FLUTICASONE-SALMETEROL 250-50 MCG/DOSE IN AEPB Inhalation Inhale 1 puff into the lungs every 12 (twelve) hours.      . FUROSEMIDE 20 MG PO TABS Oral Take 20 mg by mouth daily. Takes a 40 mg and 20 mg to make 60 mg    . FUROSEMIDE 40 MG PO TABS Oral Take 40 mg by mouth daily. Takes a 20 mg and a 40 mg to make a 60  mg     . GUAIFENESIN ER 600 MG PO TB12 Oral Take by mouth 2 (two) times daily.      Marland Kitchen HYDROXYZINE HCL 25 MG PO TABS Oral Take 25 mg by mouth daily as needed.      Marland Kitchen LEVETIRACETAM 100 MG/ML PO SOLN Oral Take 250 mg by mouth 2 (two) times daily. Take 2.5 mls orally twice a day.    Marland Kitchen LOPERAMIDE HCL 2 MG PO TABS Oral Take 2 mg by mouth 4 (four) times daily as needed. diarrhea    . LORAZEPAM 0.5 MG PO TABS Oral Take 0.5 mg by mouth every 8 (eight) hours. anxiety    . MIRTAZAPINE 15 MG PO TABS Oral Take 15 mg by mouth at bedtime.      Carma Leaven M PLUS PO TABS Oral Take 1 tablet by mouth daily.      Marland Kitchen GLUCERNA 1.5 CAL PO LIQD Oral Take 1 Can by mouth daily.     Marland Kitchen OMEPRAZOLE 20 MG PO CPDR Oral Take 20 mg by mouth daily.      Marland Kitchen ONDANSETRON HCL 8 MG PO TABS Oral Take 8 mg by mouth every 8 (eight) hours as needed. Nausea,vomiting    . POLYETHYLENE GLYCOL 3350 PO PACK Oral Take 17 g by mouth daily as needed. For constipation     . POTASSIUM CHLORIDE CRYS ER 20 MEQ PO TBCR Oral Take 20 mEq by mouth 2 (two) times daily.      Marland Kitchen  PROMETHAZINE HCL 12.5 MG PO TABS Oral Take 12.5 mg by mouth every 6 (six) hours as needed. Nausea/vomiting    . SENNA-DOCUSATE SODIUM 8.6-50 MG PO TABS Oral Take 1 tablet by mouth daily.      . SERTRALINE HCL 50 MG PO TABS Oral Take 50 mg by mouth daily.      Marland Kitchen ZOLMITRIPTAN 5 MG PO TABS Oral Take 5 mg by mouth as needed. For migraines    . ZOLPIDEM TARTRATE 5 MG PO TABS Oral Take 2.5 mg by mouth at bedtime as needed. For sleep      BP 100/59  Pulse 76  Temp(Src) 98.3 F (36.8 C) (Rectal)  Resp 19  SpO2 100%  Physical Exam CONSTITUTIONAL: Well developed/well nourished HEAD AND FACE: Normocephalic/atraumatic EYES: EOMI/PERRL ENMT: Mucous membranes moist NECK: supple no meningeal signs SPINE:entire spine nontender CV: S1/S2 noted, no murmurs/rubs/gallops noted LUNGS: Lungs are clear to auscultation bilaterally, no apparent distress ABDOMEN: soft, nontender, no rebound or guarding, PEG site with fluid draining but no overlying redness NEURO: Pt is awake/alert GU: Foley catheter in place on arrival EXTREMITIES: pulses normal, full ROM, weakness in RUE and RLE from stroke SKIN: warm, color normal PSYCH: no abnormalities of mood noted  ED Course  Gastrostomy tube replacement Performed by: [name] Authorized by: [name] Consent: Verbal consent obtained. Consent given by: patient Local anesthesia used: no Patient sedated: no Patient tolerance: Patient tolerated the procedure well with no immediate complications. Comments: Area was cleansed with betadine and  Foley was placed without incident Placement confirmed by radiology     DIAGNOSTIC STUDIES: Oxygen Saturation is 100% on room air, normal by my interpretation.    COORDINATION OF CARE: 12:32PM- Patient informed of current plan for treatment and evaluation and agrees with plan at this time.   D/w dr fields, and after I placed foley, she came in and placed PEG tube herself    1. Gastrostomy  complication       MDM  Nursing notes reviewed and considered in documentation       I personally performed the services described in this documentation, which was scribed in my presence. The recorded information has been reviewed and considered.      Samantha Gaskins, MD 12/31/11 1524

## 2011-12-31 NOTE — ED Notes (Signed)
Pt from [facility]. Alert/oriented is some. Pt has indwelling cath. Sent to ed due pulling out PEG tube per staff. Pt states she did not know it was out. Has some serosanguinis d/c and foul odor.

## 2011-12-31 NOTE — Progress Notes (Signed)
PT HAD BEDSIDE PEG CHANGE. FOLEY CATH REMOVED. 20 FR BS BALLOON PEG PLACED. 6 CC STERILE WATAR IN BALLOON. Marland Kitchen

## 2011-12-31 NOTE — ED Notes (Signed)
ems called to transfer back. Dr fields placed 60f PEG tube in. 6cc sterile water in balloon. 5.5 at skin. Pt tolerated well. Nad.

## 2012-01-01 ENCOUNTER — Emergency Department (HOSPITAL_COMMUNITY): Payer: PRIVATE HEALTH INSURANCE

## 2012-01-01 ENCOUNTER — Encounter (HOSPITAL_COMMUNITY): Payer: Self-pay

## 2012-01-01 ENCOUNTER — Emergency Department (HOSPITAL_COMMUNITY)
Admission: EM | Admit: 2012-01-01 | Discharge: 2012-01-01 | Disposition: A | Discharge status: Skilled Nursing Facility | Payer: PRIVATE HEALTH INSURANCE | Attending: Emergency Medicine | Admitting: Emergency Medicine

## 2012-01-01 DIAGNOSIS — Z7982 Long term (current) use of aspirin: Secondary | ICD-10-CM | POA: Insufficient documentation

## 2012-01-01 DIAGNOSIS — F329 Major depressive disorder, single episode, unspecified: Secondary | ICD-10-CM | POA: Insufficient documentation

## 2012-01-01 DIAGNOSIS — I509 Heart failure, unspecified: Secondary | ICD-10-CM | POA: Insufficient documentation

## 2012-01-01 DIAGNOSIS — Z79899 Other long term (current) drug therapy: Secondary | ICD-10-CM | POA: Insufficient documentation

## 2012-01-01 DIAGNOSIS — F3289 Other specified depressive episodes: Secondary | ICD-10-CM | POA: Insufficient documentation

## 2012-01-01 DIAGNOSIS — R109 Unspecified abdominal pain: Secondary | ICD-10-CM | POA: Insufficient documentation

## 2012-01-01 DIAGNOSIS — K219 Gastro-esophageal reflux disease without esophagitis: Secondary | ICD-10-CM | POA: Insufficient documentation

## 2012-01-01 DIAGNOSIS — K9423 Gastrostomy malfunction: Secondary | ICD-10-CM | POA: Insufficient documentation

## 2012-01-01 DIAGNOSIS — Z9889 Other specified postprocedural states: Secondary | ICD-10-CM | POA: Insufficient documentation

## 2012-01-01 DIAGNOSIS — I739 Peripheral vascular disease, unspecified: Secondary | ICD-10-CM | POA: Insufficient documentation

## 2012-01-01 DIAGNOSIS — J45909 Unspecified asthma, uncomplicated: Secondary | ICD-10-CM | POA: Insufficient documentation

## 2012-01-01 DIAGNOSIS — E119 Type 2 diabetes mellitus without complications: Secondary | ICD-10-CM | POA: Insufficient documentation

## 2012-01-01 DIAGNOSIS — Y849 Medical procedure, unspecified as the cause of abnormal reaction of the patient, or of later complication, without mention of misadventure at the time of the procedure: Secondary | ICD-10-CM | POA: Insufficient documentation

## 2012-01-01 DIAGNOSIS — M810 Age-related osteoporosis without current pathological fracture: Secondary | ICD-10-CM | POA: Insufficient documentation

## 2012-01-01 DIAGNOSIS — E785 Hyperlipidemia, unspecified: Secondary | ICD-10-CM | POA: Insufficient documentation

## 2012-01-01 DIAGNOSIS — Z8673 Personal history of transient ischemic attack (TIA), and cerebral infarction without residual deficits: Secondary | ICD-10-CM | POA: Insufficient documentation

## 2012-01-01 HISTORY — DX: Insomnia, unspecified: G47.00

## 2012-01-01 HISTORY — DX: Foreign body in bladder, initial encounter: T19.1XXA

## 2012-01-01 HISTORY — DX: Major depressive disorder, single episode, unspecified: F32.9

## 2012-01-01 HISTORY — DX: Transient cerebral ischemic attack, unspecified: G45.9

## 2012-01-01 HISTORY — DX: Hemiplegia, unspecified affecting unspecified side: G81.90

## 2012-01-01 HISTORY — DX: Foreign body in urethra, initial encounter: T19.0XXA

## 2012-01-01 HISTORY — DX: Edema, unspecified: R60.9

## 2012-01-01 HISTORY — DX: Depression, unspecified: F32.A

## 2012-01-01 HISTORY — DX: Cerebral atherosclerosis: I67.2

## 2012-01-01 HISTORY — DX: Heart failure, unspecified: I50.9

## 2012-01-01 HISTORY — DX: Anemia, unspecified: D64.9

## 2012-01-01 HISTORY — DX: Methicillin resistant Staphylococcus aureus infection, unspecified site: A49.02

## 2012-01-01 MED ORDER — IOHEXOL 300 MG/ML  SOLN
20.0000 mL | Freq: Once | INTRAMUSCULAR | Status: AC | PRN
  Administered 2012-01-01: 20 mL via INTRAVENOUS

## 2012-01-01 NOTE — ED Notes (Addendum)
Peg tube in place; abd firm; Pt also has quarter size wound on upper L inner arm; Dressing reapplied

## 2012-01-01 NOTE — ED Notes (Signed)
Pt stable at discharge No distress noted 

## 2012-01-01 NOTE — ED Notes (Signed)
Patient was placed on 2 lpm via nasal cannula per nurse due to low o2 sat.

## 2012-01-01 NOTE — Discharge Instructions (Signed)
Care of a Feeding Tube Site  Individuals who have trouble swallowing or cannot take food or medication by mouth are sometimes given feeding tubes. A feeding tube can go into the nose and down to the stomach or through the skin in the abdomen and into the stomach or small bowel. Some of the names of these feeding tubes are gastrostomy tubes, PEG lines, nasogastric tubes, and gastrojejunostomy tubes.   Liquids or foods that have been made into a thick, smooth soup consistency (pureed), along with medications, may be given through the tube. There are several ways to give the liquid food (formula), and there are several kinds of prescribed formulas. Your caregiver will arrange for you to get nutrition in the right amounts for you.  EQUIPMENT NEEDED FOR TUBE FEEDING   A 60 mL syringe.   Formula suggested by your caregiver or dietician.   A specific plug to put into the feeding tube tip during the times when you are not getting food or medications.   A feeding pump or a place to hang your food container (an IV pole or a wall hook) so it can operate by gravity while you are getting your feedings. You attach the tube from the food container to the end of the feeding tube. Your caregivers will help you with these supplies or tell you where to get them.  PROCEDURE FOR TUBE FEEDING  1. Wash your hands before touching the equipment, food, medications, or the site (where the tube enters your body).  2. Check the tube placement before starting the feeding by removing the plug in the end of the feeding tube and attaching a syringe to the feeding tube. Pull the plunger back and you will usually see some yellowish or greenish fluid. This tells you the tube is in the correct place. Note the amount and gently push the residual back into the tube.   Ask your caregiver if there are instances when you would not start tube feedings depending on the amount or type of contents withdrawn from the stomach.   If gastrointestinal (GI)  contents do not show when you pull back on the plunger, measure the length from the stoma site (the opening in your body made for feeding) to the end of your feeding tube. If this different from the previous measurement, you should call your caregiver.   If at any point the feeding tube seems blocked, use your syringe and pull back on the plunger. If this does not work, try to gently force some warm water through the tube. If none of these methods work, call your caregiver. It is important not to miss your medications, food, or water.  3. If everything with the tube seems to be okay, insert the tip of the tube from the food container into your feeding tube.  4. While sitting up or with your head propped up at least 30 degrees to 45 degrees, start the feeding. If you develop coughing or have trouble breathing, stop the feeding immediately.  5. Administer the feeding for the length of time instructed by your caregiver.  6. To keep the tube open, following the feeding, flush the tube with 30 mLs of water using a syringe, and replace the plug at the end of the feeding tube.  7. Do not leave unused food out between feedings. Refrigerate or store it as directed.  GIVING YOUR MEDICATIONS THROUGH THE TUBE   Use liquid forms of your medications, if available.   Some pills or   tablets may be crushed and put into warm water. Do not use hot water, which could affect the contents.   Ask the pharmacist if you can crush your pills. Do not crush capsules that have SR (sustained release), XR (extended release), or CD (controlled release) printed on them unless your caregiver or pharmacist says it is okay.   After you have crushed your pills or capsules into small pieces or a powder, let the pieces dissolve in warm (not hot) water so that no pieces will clog your tube. Draw medication up into your syringe by pulling back on the plunger.   Attach the syringe to the end of the feeding tube and push on the plunger to give your  medication.   Flush the tube with 30 mLs of water after giving your medication. This makes sure you have received all of your medications.  CARE OF THE SKIN AROUND THE ENTRANCE OF THE TUBE   Check the skin daily where the tube enters the abdomen for redness, irritation, drainage, or tenderness.   Clean around the tube daily with water or soap and water using cotton-tipped applicators or gauze squares. Dry completely after cleaning.   Change the dressing around the tube insertion site daily or as instructed.   Apply antibiotic ointment around the site, if directed by your caregiver.   If the dressing becomes wet or soiled, change it as soon as convenient.   You may use tape to fasten your feeding tube to your skin for comfort or do as directed.  SEEK IMMEDIATE MEDICAL CARE IF:    You develop coughing or have trouble breathing during a feeding. Stop the feeding and call your caregiver immediately.   You notice swelling, redness, drainage, or tenderness at the tube insertion site.   You develop pain,nausea, vomiting, diarrhea, constipation, or bleeding around the tube insertion site.   The tube seems plugged and you are unable to get water through the tube.   There is food leaking from around the tube.   The tube falls out.  Document Released: 08/16/2005 Document Revised: 08/05/2011 Document Reviewed: 11/11/2006  [employer] Patient Information 2012 [employer]

## 2012-01-01 NOTE — ED Provider Notes (Signed)
History  This chart was scribed for Joya Gaskins, MD by Bennett Scrape. This patient was seen in room APA10/APA10 and the patient's care was started at 11:11AM.  CSN: [id number]  Arrival date & time 01/01/12  1058   First MD Initiated Contact with Patient 01/01/12 1111      Chief Complaint  Patient presents with  . check peg tube placement     The history is provided by a relative and the patient. No language interpreter was used.    [name] is a 78 y.o. female who presents to the Emergency Department for a PEG tube replacement check. Pt's daughter states that the tube is leaking and the pt c/o pain around the tube site. Pt was seen here yesterday because she pulled her PEG tube out. Dr. Darrick Penna put in a new tube yesterday and pt was discharged back to [facility]. Daughter states that the staff noticed the the pt's abdomen was distended and noticed that the pt's medications were leaking out of the tube. Pt reports that the site burned with flushing today. Pt denies fever, diarrhea, chest pain and abdominal pain. Pt has chronic right-sided weakness from a prior CVA. She also has a h/o diabetes, PVD and CHF. She is a former smoker but denies alcohol use.  Past Medical History  Diagnosis Date  . GERD (gastroesophageal reflux disease)   . Dysphagia 2008    LAST BPE 2011: SEVERE EMD  . Stroke 2010    R HEMIPARESIS  . Left carotid artery occlusion   . Obesity, morbid (more than 100 lbs over ideal weight or BMI > 40)   . Diabetes mellitus   . Encounter for PEG (percutaneous endoscopic gastrostomy) SEP 2011 [facility] RADS  . Restless leg syndrome   . Hyperlipemia   . PVD (peripheral vascular disease)   . Asthma   . CHF (congestive heart failure)   . Edema   . Hemiplegia   . Insomnia   . Anemia   . MRSA infection   . TIA (transient ischemic attack)   . Foreign body in bladder and urethra   . Osteoporosis   . Depression   . Cerebral atherosclerosis     Past Surgical History    Procedure Date  . Colonoscopy 2008    SIMPLE ADENOMA, bX: NEG FOR MICROSCOPIC COLITIS  . Ventral hernia repair 2005  . Upper gastrointestinal endoscopy 2005 NUR    W/ DILATATION  . Peg placement 07/12/2011 20 FR     MAY 2-20 FR BS BALLOON PEG    No family history on file.  History  Substance Use Topics  . Smoking status: Former Games developer  . Smokeless tobacco: Not on file  . Alcohol Use: No    Review of Systems  A complete 10 system review of systems was obtained and all systems are negative except as noted in the HPI and PMH.   Allergies  Macrobid and Penicillins  Home Medications   Current Outpatient Rx  Name Route Sig Dispense Refill  . ACETAMINOPHEN 325 MG PO TABS Oral Take 650 mg by mouth every 4 (four) hours as needed. For pain    . ASPIRIN 81 MG PO TABS Oral Take 81 mg by mouth daily.      Marland Kitchen CALCIUM 500 PO Oral Take 1 tablet by mouth daily.     . CHOLECALCIFEROL 5000 UNITS PO CAPS Oral Take 5,000 Units by mouth daily.      Marland Kitchen CLOPIDOGREL BISULFATE 75 MG PO TABS Oral  Take 75 mg by mouth daily.      Marland Kitchen PRO-STAT 64 PO LIQD Oral Take 60 mLs by mouth 2 (two) times daily.     Marland Kitchen FLUTICASONE-SALMETEROL 250-50 MCG/DOSE IN AEPB Inhalation Inhale 1 puff into the lungs every 12 (twelve) hours.      . FUROSEMIDE 20 MG PO TABS Oral Take 20 mg by mouth daily. Takes a 40 mg and 20 mg to make 60 mg    . FUROSEMIDE 40 MG PO TABS Oral Take 40 mg by mouth daily. Takes a 20 mg and a 40 mg to make a 60 mg     . GUAIFENESIN ER 600 MG PO TB12 Oral Take by mouth 2 (two) times daily.      Marland Kitchen HYDROXYZINE HCL 25 MG PO TABS Oral Take 25 mg by mouth daily as needed.      Marland Kitchen LEVETIRACETAM 100 MG/ML PO SOLN Oral Take 250 mg by mouth 2 (two) times daily. Take 2.5 mls orally twice a day.    Marland Kitchen LOPERAMIDE HCL 2 MG PO TABS Oral Take 2 mg by mouth 4 (four) times daily as needed. diarrhea    . LORAZEPAM 0.5 MG PO TABS Oral Take 0.5 mg by mouth every 8 (eight) hours. anxiety    . MIRTAZAPINE 15 MG PO TABS Oral  Take 15 mg by mouth at bedtime.      Carma Leaven M PLUS PO TABS Oral Take 1 tablet by mouth daily.      Marland Kitchen GLUCERNA 1.5 CAL PO LIQD Oral Take 1 Can by mouth daily.     Marland Kitchen OMEPRAZOLE 20 MG PO CPDR Oral Take 20 mg by mouth daily.      Marland Kitchen ONDANSETRON HCL 8 MG PO TABS Oral Take 8 mg by mouth every 8 (eight) hours as needed. Nausea,vomiting    . POLYETHYLENE GLYCOL 3350 PO PACK Oral Take 17 g by mouth daily as needed. For constipation     . POTASSIUM CHLORIDE CRYS ER 20 MEQ PO TBCR Oral Take 20 mEq by mouth 2 (two) times daily.      Marland Kitchen PROMETHAZINE HCL 12.5 MG PO TABS Oral Take 12.5 mg by mouth every 6 (six) hours as needed. Nausea/vomiting    . SENNA-DOCUSATE SODIUM 8.6-50 MG PO TABS Oral Take 1 tablet by mouth daily.      . SERTRALINE HCL 50 MG PO TABS Oral Take 50 mg by mouth daily.      Marland Kitchen ZOLMITRIPTAN 5 MG PO TABS Oral Take 5 mg by mouth as needed. For migraines    . ZOLPIDEM TARTRATE 5 MG PO TABS Oral Take 2.5 mg by mouth at bedtime as needed. For sleep      Triage Vitals: BP 98/49  Pulse 97  Temp(Src) 97.7 F (36.5 C) (Oral)  Resp 16  Wt 255 lb (115.667 kg)  SpO2 100%  Physical Exam  Nursing note and vitals reviewed.  CONSTITUTIONAL: Well developed/well nourished HEAD AND FACE: Normocephalic/atraumatic EYES: EOMI ENMT: Mucous membranes moist NECK: supple no meningeal signs CV: S1/S2 noted, no murmurs/rubs/gallops noted LUNGS: Lungs are clear to auscultation bilaterally, no apparent distress ABDOMEN: soft, no rebound or guarding, PEG tube in place, it is clean and dry, no abdominal tenderness noted NEURO: Pt is awake/alert EXTREMITIES: pulses normal,right arm and right leg weakness from prior CVA SKIN: warm, color normal PSYCH: no abnormalities of mood noted  ED Course  Procedures   DIAGNOSTIC STUDIES: Oxygen Saturation is 100% on O2, normal by my interpretation.  COORDINATION OF CARE: 11:21AM-Discussed treatment plan which includes x-ray of abdomen to make sure PEG tube is  still in place with pt and duaghter and daughter and pt agreed. 12:18PM-Discussed the radiology results with family and family acknowledged results. Per nurse, tube is leaking only small amounts of fluid. Advised family to have [facility] staff inject fluids slowly into the the tube to avoid leakage.  Otherwise well appearing and stable for d/c  The patient appears reasonably screened and/or stabilized for discharge and I doubt any other medical condition or other [facility] requiring further screening, evaluation, or treatment in the ED at this time prior to discharge.   Labs Reviewed - No data to display Dg Abd 1 View  01/01/2012  *RADIOLOGY REPORT*  Clinical Data:  Confirm G-tube placement  ABDOMEN - 1 VIEW  Comparison: Dec 31, 2011  Findings: Water soluble contrast was injected through the peg tube by the technologist.  The contrast material is within the stomach. There is no extravasation.  IMPRESSION: The peg tube tip is within the stomach near the antrum.  Original Report Authenticated By: Brandon Melnick, M.D.   Dg Fluoro Rm 1-60 Min  12/31/2011  *RADIOLOGY REPORT*  Clinical Data: PEG tube replacement  FLOURO RM 1-60 MIN  Comparison: 1.0 minutes  Findings: Replaced PEG tube injected with 17 ml of Omnipaque-300. Contrast opacifies the distal gastric antrum near pylorus, duodenal bulb and proximal duodenal C-loop. No contrast extravasation identified.  IMPRESSION: Tip of replaced PEG tube is within the distal gastric lumen near the pylorus.  Original Report Authenticated By: Lollie Marrow, M.D.       MDM  Nursing notes reviewed and considered in documentation xrays reviewed and considered Previous records reviewed and considered    I personally performed the services described in this documentation, which was scribed in my presence. The recorded information has been reviewed and considered.         Joya Gaskins, MD 01/01/12 [phone]

## 2012-01-01 NOTE — ED Notes (Signed)
Staff at [facility] reports pt was sent here yesterday because pulled out peg tube.  Pt had new peg tube placed yesterday.  Today staff noticed abd distention.  Also reports when meds were given, they were coming out around the tube.  Pt says this am when peg tube was flushed, it burned.   Pt denies pain at present.

## 2012-01-01 NOTE — ED Notes (Signed)
Peg tube flushed with 150 cc H20. Very slight drainage noted between tube and abd wall. MD aware. Pt states no discomfort with flushing

## 2012-01-04 ENCOUNTER — Other Ambulatory Visit (HOSPITAL_COMMUNITY): Payer: Self-pay | Admitting: Internal Medicine

## 2012-01-04 ENCOUNTER — Other Ambulatory Visit (HOSPITAL_COMMUNITY): Payer: Self-pay | Admitting: *Deleted

## 2012-01-04 DIAGNOSIS — R1110 Vomiting, unspecified: Secondary | ICD-10-CM

## 2012-01-06 ENCOUNTER — Ambulatory Visit (HOSPITAL_COMMUNITY)
Admission: RE | Admit: 2012-01-06 | Discharge: 2012-01-06 | Disposition: A | Discharge status: Home / Self Care | Payer: PRIVATE HEALTH INSURANCE | Source: Ambulatory Visit | Attending: Family Medicine | Admitting: Family Medicine

## 2012-01-06 ENCOUNTER — Ambulatory Visit: Payer: PRIVATE HEALTH INSURANCE | Admitting: Gastroenterology

## 2012-01-06 ENCOUNTER — Other Ambulatory Visit (HOSPITAL_COMMUNITY): Payer: Self-pay | Admitting: *Deleted

## 2012-01-06 DIAGNOSIS — R1110 Vomiting, unspecified: Secondary | ICD-10-CM

## 2012-01-06 DIAGNOSIS — R109 Unspecified abdominal pain: Secondary | ICD-10-CM | POA: Insufficient documentation

## 2012-01-10 ENCOUNTER — Ambulatory Visit (INDEPENDENT_AMBULATORY_CARE_PROVIDER_SITE_OTHER): Payer: Medicare Other | Admitting: Gastroenterology

## 2012-01-10 ENCOUNTER — Encounter: Payer: Self-pay | Admitting: Gastroenterology

## 2012-01-10 ENCOUNTER — Ambulatory Visit: Payer: Medicare Other | Admitting: Gastroenterology

## 2012-01-10 VITALS — BP 95/56 | HR 89 | Temp 97.6°F

## 2012-01-10 DIAGNOSIS — Z431 Encounter for attention to gastrostomy: Secondary | ICD-10-CM | POA: Insufficient documentation

## 2012-01-10 NOTE — Progress Notes (Signed)
Faxed to PCP

## 2012-01-10 NOTE — Progress Notes (Signed)
Primary Care Physician: Syliva Overman, MD, MD  Primary Gastroenterologist:  slightly   Chief Complaint  Patient presents with  . check peg tube    HPI: [name] is a 78 y.o. female here for further evaluation of PEG site. Daughter, Alver Fisher presents with patient from [facility] today. Patient's PEG came out recently and new PEG replaced (at bedside) on 12/31/11 by Dr. Darrick Penna. The following day patient was seen back in ED with complaints of PEG site pain and abdominal distention. After PEG replacement on 12/31/11 she had flouro exam which showed PEG tube tip within distal gastric lumen near pylorus. On 01/01/12, when she presented to ED for second time, she had abd view which showed peg tube tip within stomach near antrum. Patient subsequently had CT A/P without contrast on 01/04/12 which showed the PEG extending into stomach.   Daughter states, the nursing home staff have reported that the PEG site has been leaking around the tube and saturating the dressings. Patient complains of achy discomfort at PEG site. No n/v. BM regular. No melena, brbpr.   Current Outpatient Prescriptions  Medication Sig Dispense Refill  . Amino Acids-Protein Hydrolys (PRO-STAT 101) LIQD Take 1 Package by mouth 3 (three) times daily.      . Calcium Carbonate (CALCIUM 500 PO) 1 tablet by Gastric Tube route 2 (two) times daily.       . Cholecalciferol 50000 UNITS capsule 50,000 Units by Gastric Tube route every 30 (thirty) days.      . clobetasol cream (TEMOVATE) 0.05 % Apply 1 application topically as needed. For rash. Do not apply to face, groin, or under arms      . clopidogrel (PLAVIX) 75 MG tablet Take 75 mg by mouth daily.        . clotrimazole-betamethasone (LOTRISONE) cream Apply 1 application topically 2 (two) times daily. x4 weeks for rash. Started 12/18/11      . Dextromethorphan-Guaifenesin (ROBAFEN DM) 10-100 MG/5ML liquid Take 10 mLs by mouth every 6 (six) hours as needed. cough      . dronabinol  (MARINOL) 2.5 MG capsule Take 2.5 mg by mouth daily.      . fluticasone (CUTIVATE) 0.05 % cream Apply 1 application topically 2 (two) times daily as needed. For rash      . Fluticasone-Salmeterol (ADVAIR DISKUS) 250-50 MCG/DOSE AEPB Inhale 1 puff into the lungs every 12 (twelve) hours.        . furosemide (LASIX) 20 MG tablet Take 20 mg by mouth daily. Takes a 40 mg and 20 mg to make 60 mg      . furosemide (LASIX) 40 MG tablet Take 40 mg by mouth daily. To take with 20mg  to make 60mg  daily      . guaiFENesin (MUCINEX) 600 MG 12 hr tablet Take 600 mg by mouth 2 (two) times daily.       . hydrOXYzine (ATARAX/VISTARIL) 25 MG tablet Take 25 mg by mouth 2 (two) times daily.       Marland Kitchen ipratropium-albuterol (DUONEB) 0.5-2.5 (3) MG/3ML SOLN Take 3 mLs by nebulization every 6 (six) hours as needed. For wheezing      . Jevity 1.5 Cal (JEVITY 1.5) LIQD Take 1,000 mLs by mouth daily. Give 48ml/hr via G-Tube every shift for weight loss      . levETIRAcetam (KEPPRA) 100 MG/ML solution Take 250 mg by mouth 2 (two) times daily. Take 2.5 mls orally twice a day.      . loperamide (IMODIUM A-D) 2 MG  tablet Take 4 mg by mouth as needed. diarrhea      . metoCLOPramide (REGLAN) 5 MG tablet Take 5 mg by mouth 3 (three) times daily.      . mirtazapine (REMERON) 15 MG tablet Take 15 mg by mouth at bedtime.        . Multiple Vitamins-Iron (MULTIVITAMINS WITH IRON) TABS Take 1 tablet by mouth daily.      Marland Kitchen nystatin (MYCOSTATIN) powder Apply topically as needed. Apply to abdominal folds topically as needed for redness and under breasts, bend of legs and groin      . olopatadine (PATANOL) 0.1 % ophthalmic solution Place 1 drop into both eyes as needed. For itchy eyes      . omeprazole (PRILOSEC) 20 MG capsule 20 mg by PEG Tube route daily.       . ondansetron (ZOFRAN) 8 MG tablet Take 8 mg by mouth as needed. Nausea,vomiting      . Ondansetron HCl (ZOFRAN) 2 MG/ML SOLN injection Inject 4 mg into the vein every 6 (six) hours as  needed. For nausea and vomiting      . phenol (CHLORASEPTIC) 1.4 % LIQD Use as directed 2 sprays in the mouth or throat every 4 (four) hours as needed. For pain      . polyethylene glycol (MIRALAX / GLYCOLAX) packet Take 17 g by mouth daily as needed. For constipation      . potassium chloride (KLOR-CON) 20 MEQ packet 60 mEq by PEG Tube route daily.      . Pramoxine HCl-Cleanser (SARNA SENSITIVE) 1 % KIT Apply 1 application topically every 8 (eight) hours as needed. For itching      . predniSONE (DELTASONE) 5 MG tablet Take 5-10 mg by mouth daily. Take 2 tablets on 01/01/12, 2 tablets on 01/02/12, 1 tablet on 01/03/12, and 1 tablet on 01/04/12 for rash      . senna-docusate (SENNA PLUS) 8.6-50 MG per tablet Take 2 tablets by mouth every 12 (twelve) hours as needed. For constipation      . sertraline (ZOLOFT) 50 MG tablet Take 50 mg by mouth daily.        . Skin Protectants, Misc. (MINERIN) CREA Apply 1 application topically 2 (two) times daily. Apply to feet and legs topically every day and every evening for dryness      . traMADol (ULTRAM) 50 MG tablet Take 50 mg by mouth every 6 (six) hours as needed. For pain      . triamcinolone cream (KENALOG) 0.1 % Apply 1 application topically 3 (three) times daily as needed. For rash      . zinc oxide 20 % ointment Apply 1 application topically every evening. Apply to inner thighs every shift for friction      . zolpidem (AMBIEN) 5 MG tablet Take 2.5 mg by mouth at bedtime as needed. For insomnia      . acetaminophen (TYLENOL) 325 MG tablet Take 650 mg by mouth every 4 (four) hours as needed. For pain      . DISCONTD: furosemide (LASIX) 40 MG tablet Take 40 mg by mouth daily. Takes a 20 mg and a 40 mg to make a 60 mg         Allergies as of 01/10/2012 - Review Complete 01/10/2012  Allergen Reaction Noted  . Macrobid (nitrofurantoin) Other (See Comments) 01/01/2012  . Penicillins Itching and Rash 11/24/2007    ROS:  General: Negative for anorexia, weight  loss, fever, chills. Patient is virtually bedbound at  this point. Arrived to clinic on stretcher.  ENT: Negative for hoarseness,  nasal congestion. Eats very little po.  CV: Negative for chest pain, angina, palpitations, dyspnea on exertion, peripheral edema.  Respiratory: Negative for dyspnea at rest, dyspnea on exertion, cough, sputum, wheezing.  GI: See history of present illness. GU:  Negative for dysuria, hematuria, urinary incontinence, urinary frequency, nocturnal urination.  Endo: Negative for unusual weight change.    Physical Examination:   BP 95/56  Pulse 89  Temp(Src) 97.6 F (36.4 C) (Temporal)  General: Elderly female, accompanied by daughter. NAD.  Eyes: No icterus. Mouth: Oropharyngeal mucosa moist and pink , no lesions erythema or exudate.  Abdomen: Bowel sounds are normal, nontender, nondistended, no hepatosplenomegaly or masses, no abdominal bruits or hernia , no rebound or guarding.  PEG site just right of midline in upper abd. Small amount of white/green drainage around tube. On right side of tube, 3mm area of erythema/erosion.  Extremities: No lower extremity edema. Right upper ext hemiparesis. Neuro: Alert and oriented x 4   Skin: Warm and dry, no jaundice.   Psych: Alert and cooperative, normal mood and affect.     Imaging Studies: Ct Abdomen Pelvis Wo Contrast  01/06/2012  *RADIOLOGY REPORT*  Clinical Data:  Vomiting, abdominal pain  CT CHEST, ABDOMEN AND PELVIS WITHOUT CONTRAST  Technique:  Multidetector CT imaging of the chest, abdomen and pelvis was performed following the standard protocol without IV contrast.  Comparison:  CT abdomen 12/28/2009  CT CHEST  Findings:  No axillary or supraclavicular lymphadenopathy.  No mediastinal or hilar lymphadenopathy.  No pericardial fluid. Coronary calcifications present.  There is likely a pulmonary nodule at the right lung base measuring 10 mm.  There is significant respiratory motion.  No pneumothorax. No pleural fluid.   IMPRESSION: 1.   No acute findings in the thorax.  2.  Potential 10 mm right lower lobe pulmonary nodule.  Exam is degraded by respiratory motion.  Recommend repeat CT without contrast in 3 months.  CT ABDOMEN AND PELVIS  Findings:  Non-IV contrast images demonstrate no focal hepatic lesion.  The gallbladder, pancreas, spleen, and adrenal gland are unchanged. Fatty replacement of the pancreas.  The stomach, small bowel, colon unchanged.  There is a percutaneous enteric tube extending into the stomach.  Abdominal aorta normal caliber.  No retroperitoneal lymphadenopathy.  No free fluid the pelvis.  No abdominal pelvic abscess.  No intraperitoneal free air.  There is a large calcified leiomyoma within the uterus.  Foley catheter in the bladder.  No pelvic lymphadenopathy.  IMPRESSION:  1.  No acute abdominal or pelvic findings on non contrast exam.  2.  No evidence of abscess or obstruction.  Original Report Authenticated By: Genevive Bi, M.D.   Dg Abd 1 View  01/01/2012  *RADIOLOGY REPORT*  Clinical Data:  Confirm G-tube placement  ABDOMEN - 1 VIEW  Comparison: Dec 31, 2011  Findings: Water soluble contrast was injected through the peg tube by the technologist.  The contrast material is within the stomach. There is no extravasation.  IMPRESSION: The peg tube tip is within the stomach near the antrum.  Original Report Authenticated By: Brandon Melnick, M.D.    Dg Fluoro Rm 1-60 Min  12/31/2011  *RADIOLOGY REPORT*  Clinical Data: PEG tube replacement  FLOURO RM 1-60 MIN  Comparison: 1.0 minutes  Findings: Replaced PEG tube injected with 17 ml of Omnipaque-300. Contrast opacifies the distal gastric antrum near pylorus, duodenal bulb and proximal duodenal C-loop. No  contrast extravasation identified.  IMPRESSION: Tip of replaced PEG tube is within the distal gastric lumen near the pylorus.  Original Report Authenticated By: Lollie Marrow, M.D.

## 2012-01-10 NOTE — Assessment & Plan Note (Signed)
PEG replaced after became dislodged on 12/31/11. Now with new complaints of drainage around the PEG tube. Small amount of white/green mucous material around PEG site. Small area of erosion as described above. Area needs to be keep dry as possible. I will discuss further with Dr. Darrick Penna. Further recommendations to follow.

## 2012-01-19 ENCOUNTER — Ambulatory Visit: Payer: PRIVATE HEALTH INSURANCE | Admitting: Gastroenterology

## 2012-01-30 NOTE — Progress Notes (Signed)
SPOKE TO [facility]. D/C NOT AN ISSUE. SKIN AT 5.5 CM. FEELS LIKE BUMPER IS SNUG.  REVIEWED.

## 2012-02-07 ENCOUNTER — Telehealth: Payer: Self-pay

## 2012-02-07 NOTE — Telephone Encounter (Signed)
Pager was sent at 10:30 AM to Dr. Darrick Penna who is doing procedures.  Pt's daughter came by after that and asked would it be possible for Dr. Darrick Penna to go to the nursing home to replace peg. Routing this to Dr. Darrick Penna. Told her I will let them know when I hear from Dr. Darrick Penna.

## 2012-02-07 NOTE — Telephone Encounter (Signed)
Called daughter. She was not home. Called [facility] and informed the nurse, Angela Nevin and she said family was there and she will inform them.

## 2012-02-07 NOTE — Telephone Encounter (Signed)
REVIEWED.  

## 2012-02-07 NOTE — Telephone Encounter (Signed)
T/C from [facility] @ [facility] requesting an URGENT appt for pt to get feeding tube replaced.  I spoke to the nurse Angela Nevin who said the peg tube  was found out by 3rd shift on 02/04/2012. They contacted Dr. Caesar Bookman who was on call for Dr. Darrick Penna He said to wait and see GI for consult. ( They had tired to schedule over the week-end at [facility] and [facility] and was unable to get it scheduled.) They do have a foley in and pt has been able to eat some things that she chooses and drank Ensure. She did not eat this AM, and has only had a little water when they attempted to give her meds PO, but pt spit the pills back out. Please advise !

## 2012-02-07 NOTE — Telephone Encounter (Signed)
WILL CHANGE PEG AT [facility] TODAY. LET DAUGHTER KNOW.

## 2012-02-10 IMAGING — CR DG CHEST 1V PORT
1 series · 1 of 1 positions shown · non-contrast
Comparison: 08/13/2009

CLINICAL DATA: Extremity swelling.  CHF.  Hypertension.  Diabetes.
Morbid obesity.  Stroke.  Ex-smoker.

PORTABLE CHEST - 1 VIEW

[view not recorded]
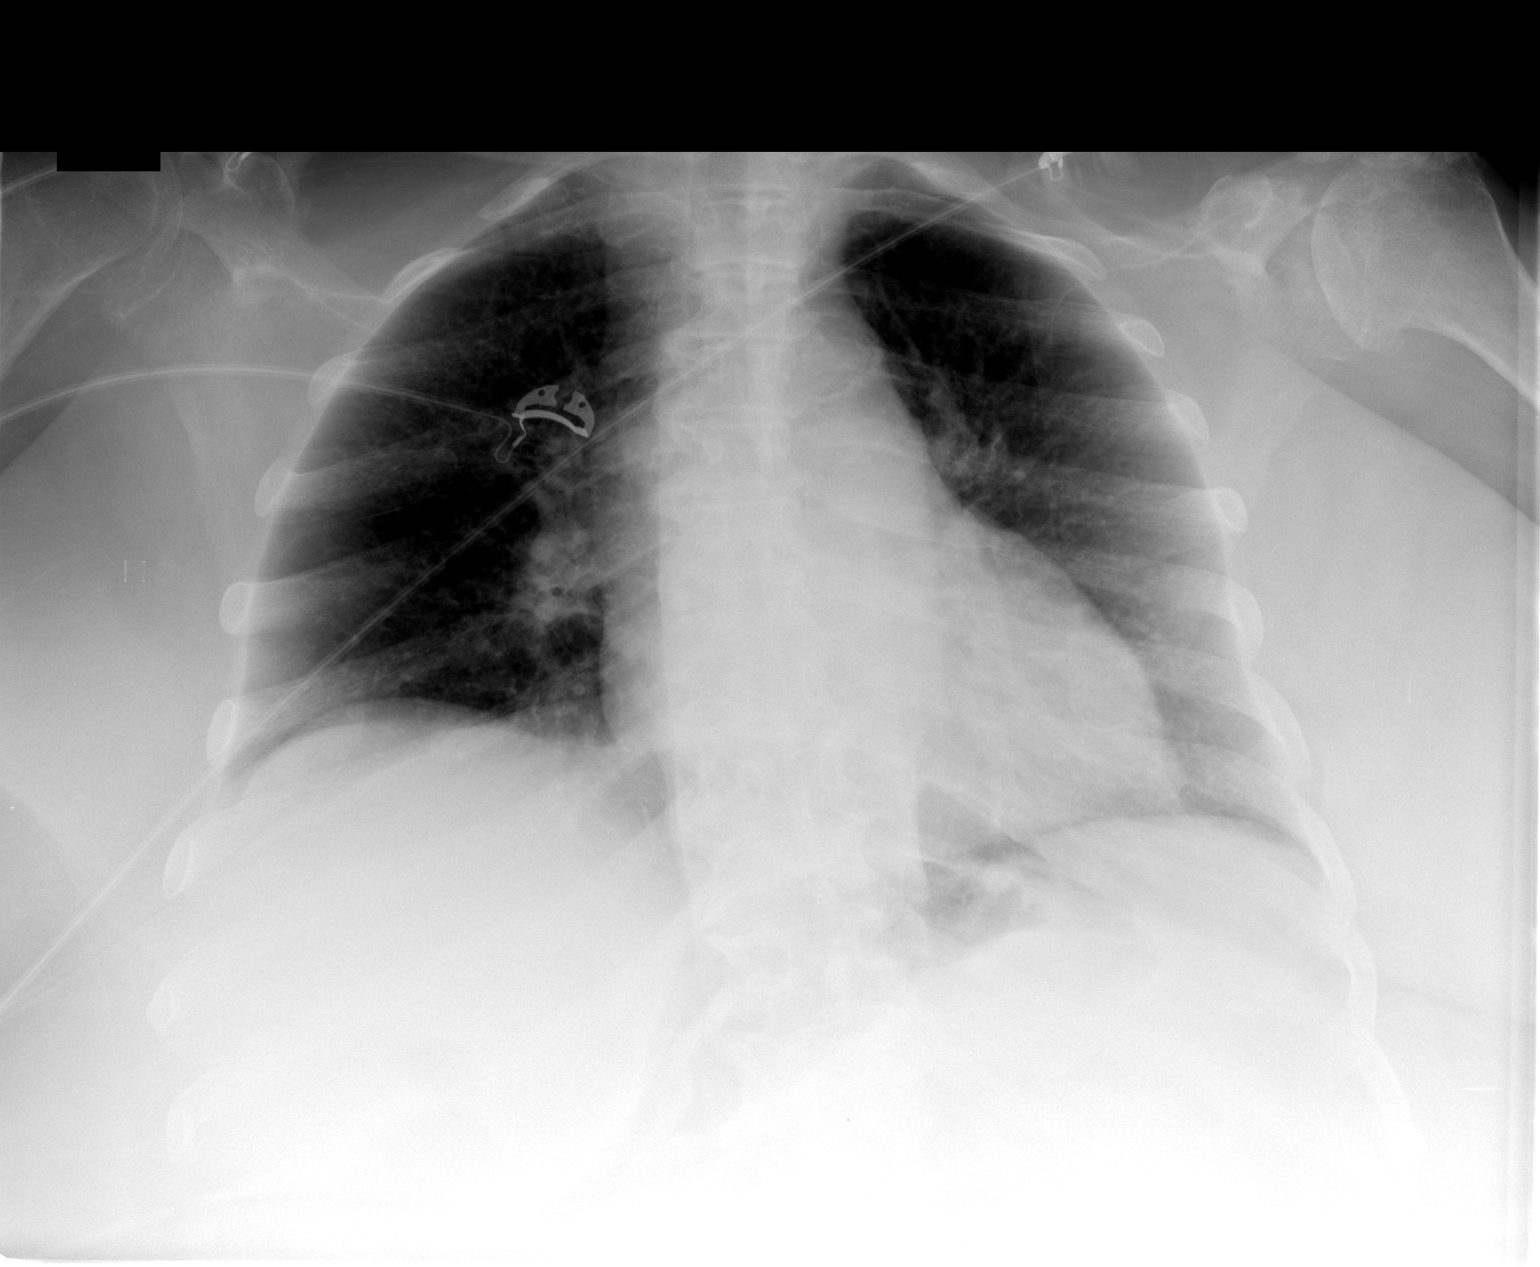

[1 of 1 positions shown; findings below may reference images not displayed]

FINDINGS: High-riding humeral heads consistent with chronic rotator
cuff insufficiency. Midline trachea.  Normal heart size for level
of inspiration.  Calcified transverse aorta. No pleural effusion or
pneumothorax.  No congestive failure.

Low lung volumes. Clear lungs.
IMPRESSION: Low lung volumes without acute finding.

## 2012-05-25 ENCOUNTER — Encounter: Payer: Self-pay | Admitting: Gastroenterology

## 2012-08-01 ENCOUNTER — Ambulatory Visit (HOSPITAL_COMMUNITY)
Admission: RE | Admit: 2012-08-01 | Discharge: 2012-08-01 | Disposition: A | Discharge status: Home / Self Care | Payer: PRIVATE HEALTH INSURANCE | Source: Ambulatory Visit | Attending: Internal Medicine | Admitting: Internal Medicine

## 2012-08-01 ENCOUNTER — Other Ambulatory Visit (HOSPITAL_COMMUNITY): Payer: Self-pay | Admitting: Internal Medicine

## 2012-08-01 DIAGNOSIS — J45909 Unspecified asthma, uncomplicated: Secondary | ICD-10-CM | POA: Insufficient documentation

## 2012-08-01 DIAGNOSIS — R109 Unspecified abdominal pain: Secondary | ICD-10-CM

## 2012-08-01 DIAGNOSIS — D259 Leiomyoma of uterus, unspecified: Secondary | ICD-10-CM | POA: Insufficient documentation

## 2012-08-01 DIAGNOSIS — K7689 Other specified diseases of liver: Secondary | ICD-10-CM | POA: Insufficient documentation

## 2012-08-01 DIAGNOSIS — E119 Type 2 diabetes mellitus without complications: Secondary | ICD-10-CM | POA: Insufficient documentation

## 2012-08-10 ENCOUNTER — Telehealth: Payer: Self-pay

## 2012-08-10 NOTE — Telephone Encounter (Signed)
T/C from Patsy at [facility], said pt's peg was leaking and she was having some pain around the site. She could not give much info, said she will have a nurse call me and see if pt needs OV prior to scheduling replacement. ( She said she thought pt has not been using for food and meds for awhile, but she will have them call).

## 2012-08-11 ENCOUNTER — Telehealth: Payer: Self-pay | Admitting: Gastroenterology

## 2012-08-11 NOTE — Telephone Encounter (Signed)
CALLED BY RN. PT HAVING PEG SITE PAIN. DURING PHONE CALL. PT'S PEG FELL OUT. WILL REPLACE BEDSIDE.

## 2012-08-14 NOTE — Telephone Encounter (Signed)
Called to speak to pt's nurse. Receptionist said she is on the phone. Will call back .

## 2012-08-21 NOTE — Telephone Encounter (Signed)
See phone note dated 08/11/2012.

## 2012-08-25 ENCOUNTER — Encounter: Payer: Self-pay | Admitting: Vascular Surgery

## 2013-01-23 ENCOUNTER — Telehealth: Payer: Self-pay | Admitting: Gastroenterology

## 2013-01-23 NOTE — Telephone Encounter (Signed)
Nurse from [facility] is calling asking about a peg tube Dr. Darrick Penna is supposed to come to put in bed side please advise

## 2013-01-23 NOTE — Telephone Encounter (Signed)
[name] and [facility] for a return call for more information.

## 2013-01-24 NOTE — Telephone Encounter (Signed)
I returned Tracey's call. She said this pt is having some leaking and needs a new peg tube sometime. She said I can talk to her nurse, Angela Nevin.

## 2013-01-24 NOTE — Telephone Encounter (Signed)
PLEASE CALL PT'S FACILITY. WILL CHANGE TUBE MAY 29 AT 1 PM.

## 2013-01-24 NOTE — Telephone Encounter (Signed)
Called and spoke to [employer]. She said that the pt is having some leaking and needs replacement tube. She is eating as usual and she has taken some meds orally. She said OK to find out if Dr. Darrick Penna might do bedside tube and when and let Tracey know.

## 2013-01-24 NOTE — Telephone Encounter (Signed)
Called and informed Tracey at [facility].

## 2013-01-24 NOTE — Telephone Encounter (Signed)
Called to speak to [name] and was given VM. Did not leave a message, will try a little later.

## 2013-01-25 ENCOUNTER — Telehealth: Payer: Self-pay | Admitting: Gastroenterology

## 2013-01-25 ENCOUNTER — Encounter (INDEPENDENT_AMBULATORY_CARE_PROVIDER_SITE_OTHER): Payer: PRIVATE HEALTH INSURANCE | Admitting: Gastroenterology

## 2013-01-25 DIAGNOSIS — K6289 Other specified diseases of anus and rectum: Secondary | ICD-10-CM

## 2013-01-25 DIAGNOSIS — K219 Gastro-esophageal reflux disease without esophagitis: Secondary | ICD-10-CM

## 2013-01-25 NOTE — Progress Notes (Signed)
  Subjective:    Patient ID: [name], female    DOB: 24-Apr-1932, 79 y.o.   MRN: [id number]  HPI    Review of Systems     Objective:   Physical Exam        Assessment & Plan:  error

## 2013-01-25 NOTE — Telephone Encounter (Signed)
  PRE-OPERATIVE DIAGNOSIS:  PEG tube MALFUNCTION  POST-OPERATIVE DIAGNOSIS:  PEG TUBE REPLACEMENT  PROCEDURE:  Procedure(s): BEDSIDE PERCUTANEOUS ENDOSCOPIC GASTROSTOMY (PEG) REPLACEMENT  SURGEON:  Surgeon(s): Arlyce Harman, MD  Procedure technique: Pt  prepped and draped. PEG tube worn. PEG TRACT DILATED BY PRESSURE NECROSIS. 6 cc tube aspirated. Tube removed. PRIRO T TO REPLACEMENT ALL PORTS FLUSHED AND BALLOON INFLATED SUCCESSFULLY. Tube replaced with 20 FR [employer] PEG. Pt tolerated procedure well. BUMPER AT 6.5 CM. PEG LEFT PARALLEL POSITION WITH PAPER TAPE.  PLAN: 1. USE PEG PRN. 2. KEEP PEG PARALLEL TO PT FOR 2 WEEKS WITH PAPER TAPE. 3. RECHECK PEG IN 2 WEEKS.

## 2013-05-11 ENCOUNTER — Other Ambulatory Visit (HOSPITAL_COMMUNITY): Payer: Self-pay | Admitting: Podiatry

## 2013-05-11 DIAGNOSIS — I739 Peripheral vascular disease, unspecified: Secondary | ICD-10-CM

## 2013-05-11 DIAGNOSIS — L909 Atrophic disorder of skin, unspecified: Secondary | ICD-10-CM

## 2013-05-11 DIAGNOSIS — L603 Nail dystrophy: Secondary | ICD-10-CM

## 2013-05-15 ENCOUNTER — Ambulatory Visit (HOSPITAL_COMMUNITY)
Admission: RE | Admit: 2013-05-15 | Discharge: 2013-05-15 | Disposition: A | Discharge status: Home / Self Care | Payer: PRIVATE HEALTH INSURANCE | Source: Ambulatory Visit | Attending: Podiatry | Admitting: Podiatry

## 2013-05-15 DIAGNOSIS — I739 Peripheral vascular disease, unspecified: Secondary | ICD-10-CM

## 2013-05-15 DIAGNOSIS — L908 Other atrophic disorders of skin: Secondary | ICD-10-CM | POA: Insufficient documentation

## 2013-05-15 DIAGNOSIS — L603 Nail dystrophy: Secondary | ICD-10-CM

## 2013-05-15 DIAGNOSIS — L909 Atrophic disorder of skin, unspecified: Secondary | ICD-10-CM

## 2013-05-15 DIAGNOSIS — L608 Other nail disorders: Secondary | ICD-10-CM | POA: Insufficient documentation

## 2013-08-31 ENCOUNTER — Telehealth: Payer: Self-pay

## 2013-08-31 NOTE — Telephone Encounter (Signed)
Just received a fax from N. Turner, LPN at [facility]  ( [phone] ). She said the Nystatin powder to the peg site four times daily is not helping. There is discharge and drainage noted at the site of the peg.  Requesting to discontinue the Nystatin and have the wound care provider see resident for this issue.  Please advise!

## 2013-08-31 NOTE — Telephone Encounter (Deleted)
Thanks

## 2013-08-31 NOTE — Telephone Encounter (Addendum)
CALLed PT'S FACILITY. They are requesting a PEG evaluation because fluid leaking around the tube for the past 7 days. ORDER GIVEN TO MISTY. HOLD TFs AT 12N. WILL EVALUATE AT THE BEDSIDE ON TODAY AROUND 1 PM. Assessed peg site. Dry dressing in place. Tract has granulation tissue. SML AMOUNT OF DRAINAGE. RECOMMEND 1. D/C NYSTATIN 2. CONSIDER WOUND CARE CONSULT IF ADDITIONAL CONCERNS 3. CONSIDER CHANGING TUBE IF BUMPER DOES NOT REMAIN IN PLACE(TOP AT 5 CM).

## 2013-09-10 NOTE — Telephone Encounter (Signed)
CHECKED PEG SITE AT [facility]. MINIMAL DISCHARGE. NO NEED TO CHANGE PEG AT THIS TIME. DISCUSSED WITH FAMILY. THEY WILL CALL WITH QUESTIONS OR CONCERNS.

## 2013-09-11 ENCOUNTER — Encounter (HOSPITAL_COMMUNITY): Payer: Self-pay | Admitting: Emergency Medicine

## 2013-09-11 ENCOUNTER — Inpatient Hospital Stay (HOSPITAL_COMMUNITY)
Admission: EM | Admit: 2013-09-11 | Discharge: 2013-09-30 | DRG: 177 | Disposition: E | Payer: PRIVATE HEALTH INSURANCE | Attending: Pulmonary Disease | Admitting: Pulmonary Disease

## 2013-09-11 ENCOUNTER — Emergency Department (HOSPITAL_COMMUNITY): Payer: PRIVATE HEALTH INSURANCE

## 2013-09-11 DIAGNOSIS — E1169 Type 2 diabetes mellitus with other specified complication: Secondary | ICD-10-CM | POA: Diagnosis present

## 2013-09-11 DIAGNOSIS — R601 Generalized edema: Secondary | ICD-10-CM | POA: Diagnosis present

## 2013-09-11 DIAGNOSIS — N390 Urinary tract infection, site not specified: Secondary | ICD-10-CM | POA: Diagnosis present

## 2013-09-11 DIAGNOSIS — R609 Edema, unspecified: Secondary | ICD-10-CM

## 2013-09-11 DIAGNOSIS — Z515 Encounter for palliative care: Secondary | ICD-10-CM

## 2013-09-11 DIAGNOSIS — Z8614 Personal history of Methicillin resistant Staphylococcus aureus infection: Secondary | ICD-10-CM

## 2013-09-11 DIAGNOSIS — R0600 Dyspnea, unspecified: Secondary | ICD-10-CM

## 2013-09-11 DIAGNOSIS — E43 Unspecified severe protein-calorie malnutrition: Secondary | ICD-10-CM | POA: Diagnosis present

## 2013-09-11 DIAGNOSIS — G9340 Encephalopathy, unspecified: Secondary | ICD-10-CM | POA: Diagnosis present

## 2013-09-11 DIAGNOSIS — R32 Unspecified urinary incontinence: Secondary | ICD-10-CM | POA: Diagnosis present

## 2013-09-11 DIAGNOSIS — G4700 Insomnia, unspecified: Secondary | ICD-10-CM | POA: Diagnosis present

## 2013-09-11 DIAGNOSIS — E7220 Disorder of urea cycle metabolism, unspecified: Secondary | ICD-10-CM | POA: Diagnosis present

## 2013-09-11 DIAGNOSIS — R627 Adult failure to thrive: Secondary | ICD-10-CM | POA: Diagnosis present

## 2013-09-11 DIAGNOSIS — Z881 Allergy status to other antibiotic agents status: Secondary | ICD-10-CM

## 2013-09-11 DIAGNOSIS — E119 Type 2 diabetes mellitus without complications: Secondary | ICD-10-CM

## 2013-09-11 DIAGNOSIS — J810 Acute pulmonary edema: Secondary | ICD-10-CM

## 2013-09-11 DIAGNOSIS — R159 Full incontinence of feces: Secondary | ICD-10-CM | POA: Diagnosis present

## 2013-09-11 DIAGNOSIS — D638 Anemia in other chronic diseases classified elsewhere: Secondary | ICD-10-CM | POA: Diagnosis present

## 2013-09-11 DIAGNOSIS — D72829 Elevated white blood cell count, unspecified: Secondary | ICD-10-CM | POA: Diagnosis present

## 2013-09-11 DIAGNOSIS — J189 Pneumonia, unspecified organism: Secondary | ICD-10-CM | POA: Diagnosis present

## 2013-09-11 DIAGNOSIS — I739 Peripheral vascular disease, unspecified: Secondary | ICD-10-CM | POA: Diagnosis present

## 2013-09-11 DIAGNOSIS — I6350 Cerebral infarction due to unspecified occlusion or stenosis of unspecified cerebral artery: Secondary | ICD-10-CM | POA: Diagnosis present

## 2013-09-11 DIAGNOSIS — R6521 Severe sepsis with septic shock: Secondary | ICD-10-CM

## 2013-09-11 DIAGNOSIS — J9600 Acute respiratory failure, unspecified whether with hypoxia or hypercapnia: Secondary | ICD-10-CM

## 2013-09-11 DIAGNOSIS — I69959 Hemiplegia and hemiparesis following unspecified cerebrovascular disease affecting unspecified side: Secondary | ICD-10-CM

## 2013-09-11 DIAGNOSIS — K9422 Gastrostomy infection: Secondary | ICD-10-CM | POA: Diagnosis present

## 2013-09-11 DIAGNOSIS — G2581 Restless legs syndrome: Secondary | ICD-10-CM | POA: Diagnosis present

## 2013-09-11 DIAGNOSIS — R6520 Severe sepsis without septic shock: Secondary | ICD-10-CM

## 2013-09-11 DIAGNOSIS — M810 Age-related osteoporosis without current pathological fracture: Secondary | ICD-10-CM | POA: Diagnosis present

## 2013-09-11 DIAGNOSIS — F3289 Other specified depressive episodes: Secondary | ICD-10-CM | POA: Diagnosis present

## 2013-09-11 DIAGNOSIS — I509 Heart failure, unspecified: Secondary | ICD-10-CM | POA: Diagnosis present

## 2013-09-11 DIAGNOSIS — Y833 Surgical operation with formation of external stoma as the cause of abnormal reaction of the patient, or of later complication, without mention of misadventure at the time of the procedure: Secondary | ICD-10-CM | POA: Diagnosis present

## 2013-09-11 DIAGNOSIS — E162 Hypoglycemia, unspecified: Secondary | ICD-10-CM | POA: Diagnosis present

## 2013-09-11 DIAGNOSIS — Z6841 Body Mass Index (BMI) 40.0 and over, adult: Secondary | ICD-10-CM

## 2013-09-11 DIAGNOSIS — I5033 Acute on chronic diastolic (congestive) heart failure: Secondary | ICD-10-CM | POA: Diagnosis not present

## 2013-09-11 DIAGNOSIS — Z66 Do not resuscitate: Secondary | ICD-10-CM | POA: Diagnosis present

## 2013-09-11 DIAGNOSIS — E876 Hypokalemia: Secondary | ICD-10-CM | POA: Diagnosis present

## 2013-09-11 DIAGNOSIS — Z880 Allergy status to penicillin: Secondary | ICD-10-CM

## 2013-09-11 DIAGNOSIS — I10 Essential (primary) hypertension: Secondary | ICD-10-CM | POA: Diagnosis present

## 2013-09-11 DIAGNOSIS — J690 Pneumonitis due to inhalation of food and vomit: Principal | ICD-10-CM | POA: Diagnosis present

## 2013-09-11 DIAGNOSIS — R531 Weakness: Secondary | ICD-10-CM

## 2013-09-11 DIAGNOSIS — K219 Gastro-esophageal reflux disease without esophagitis: Secondary | ICD-10-CM | POA: Diagnosis present

## 2013-09-11 DIAGNOSIS — I672 Cerebral atherosclerosis: Secondary | ICD-10-CM | POA: Diagnosis present

## 2013-09-11 DIAGNOSIS — I69991 Dysphagia following unspecified cerebrovascular disease: Secondary | ICD-10-CM

## 2013-09-11 DIAGNOSIS — E8809 Other disorders of plasma-protein metabolism, not elsewhere classified: Secondary | ICD-10-CM | POA: Diagnosis present

## 2013-09-11 DIAGNOSIS — E785 Hyperlipidemia, unspecified: Secondary | ICD-10-CM | POA: Diagnosis present

## 2013-09-11 DIAGNOSIS — R1310 Dysphagia, unspecified: Secondary | ICD-10-CM | POA: Diagnosis present

## 2013-09-11 DIAGNOSIS — Z87891 Personal history of nicotine dependence: Secondary | ICD-10-CM

## 2013-09-11 DIAGNOSIS — J45909 Unspecified asthma, uncomplicated: Secondary | ICD-10-CM | POA: Diagnosis present

## 2013-09-11 DIAGNOSIS — A419 Sepsis, unspecified organism: Secondary | ICD-10-CM | POA: Diagnosis not present

## 2013-09-11 DIAGNOSIS — G9349 Other encephalopathy: Secondary | ICD-10-CM | POA: Diagnosis present

## 2013-09-11 DIAGNOSIS — F329 Major depressive disorder, single episode, unspecified: Secondary | ICD-10-CM | POA: Diagnosis present

## 2013-09-11 DIAGNOSIS — D649 Anemia, unspecified: Secondary | ICD-10-CM | POA: Diagnosis present

## 2013-09-11 LAB — URINE MICROSCOPIC-ADD ON

## 2013-09-11 LAB — BASIC METABOLIC PANEL
BUN: 34 mg/dL — AB (ref 6–23)
CALCIUM: 7.4 mg/dL — AB (ref 8.4–10.5)
CO2: 29 mEq/L (ref 19–32)
CREATININE: 0.73 mg/dL (ref 0.50–1.10)
Chloride: 105 mEq/L (ref 96–112)
GFR calc non Af Amer: 79 mL/min — ABNORMAL LOW (ref >90)
Glucose, Bld: 84 mg/dL (ref 70–99)
Potassium: 4 mEq/L (ref 3.7–5.3)
Sodium: 139 mEq/L (ref 137–147)

## 2013-09-11 LAB — CBC WITH DIFFERENTIAL/PLATELET
BASOS ABS: 0.1 10*3/uL (ref 0.0–0.1)
BASOS PCT: 0 % (ref 0–1)
EOS PCT: 4 % (ref 0–5)
Eosinophils Absolute: 0.6 10*3/uL (ref 0.0–0.7)
HCT: 25.5 % — ABNORMAL LOW (ref 36.0–46.0)
Hemoglobin: 9.2 g/dL — ABNORMAL LOW (ref 12.0–15.0)
LYMPHS PCT: 42 % (ref 12–46)
Lymphs Abs: 5.5 10*3/uL — ABNORMAL HIGH (ref 0.7–4.0)
MCH: 31.6 pg (ref 26.0–34.0)
MCHC: 36.1 g/dL — AB (ref 30.0–36.0)
MCV: 87.6 fL (ref 78.0–100.0)
MONO ABS: 1 10*3/uL (ref 0.1–1.0)
Monocytes Relative: 7 % (ref 3–12)
NEUTROS ABS: 6.1 10*3/uL (ref 1.7–7.7)
Neutrophils Relative %: 46 % (ref 43–77)
PLATELETS: 267 10*3/uL (ref 150–400)
RBC: 2.91 MIL/uL — ABNORMAL LOW (ref 3.87–5.11)
RDW: 17.3 % — AB (ref 11.5–15.5)
WBC: 13.2 10*3/uL — AB (ref 4.0–10.5)

## 2013-09-11 LAB — URINALYSIS, ROUTINE W REFLEX MICROSCOPIC
BILIRUBIN URINE: NEGATIVE
Glucose, UA: NEGATIVE mg/dL
KETONES UR: NEGATIVE mg/dL
NITRITE: NEGATIVE
PH: 7.5 (ref 5.0–8.0)
Protein, ur: NEGATIVE mg/dL
Specific Gravity, Urine: <1.005 — ABNORMAL LOW (ref 1.005–1.030)
UROBILINOGEN UA: 0.2 mg/dL (ref 0.0–1.0)

## 2013-09-11 LAB — GLUCOSE, CAPILLARY
GLUCOSE-CAPILLARY: 69 mg/dL — AB (ref 70–99)
GLUCOSE-CAPILLARY: 66 mg/dL — AB (ref 70–99)
Glucose-Capillary: 77 mg/dL (ref 70–99)
Glucose-Capillary: 91 mg/dL (ref 70–99)

## 2013-09-11 LAB — MRSA PCR SCREENING: MRSA by PCR: POSITIVE — AB

## 2013-09-11 MED ORDER — MOMETASONE FURO-FORMOTEROL FUM 100-5 MCG/ACT IN AERO
2.0000 | INHALATION_SPRAY | Freq: Two times a day (BID) | RESPIRATORY_TRACT | Status: DC
Start: 2013-09-11 — End: 2013-09-17
  Administered 2013-09-11 – 2013-09-15 (×5): 2 via RESPIRATORY_TRACT
  Filled 2013-09-11: qty 8.8

## 2013-09-11 MED ORDER — ONDANSETRON HCL 4 MG PO TABS
4.0000 mg | ORAL_TABLET | Freq: Four times a day (QID) | ORAL | Status: DC | PRN
Start: 2013-09-11 — End: 2013-09-17

## 2013-09-11 MED ORDER — LEVETIRACETAM 100 MG/ML PO SOLN
250.0000 mg | Freq: Two times a day (BID) | ORAL | Status: DC
  Filled 2013-09-11 (×2): qty 2.5

## 2013-09-11 MED ORDER — SODIUM CHLORIDE 0.9 % IV BOLUS (SEPSIS)
500.0000 mL | Freq: Once | INTRAVENOUS | Status: AC
  Administered 2013-09-11: 500 mL via INTRAVENOUS

## 2013-09-11 MED ORDER — MUPIROCIN 2 % EX OINT
1.0000 "application " | TOPICAL_OINTMENT | Freq: Two times a day (BID) | CUTANEOUS | Status: AC
  Administered 2013-09-11 – 2013-09-15 (×10): 1 via NASAL
  Filled 2013-09-11: qty 22

## 2013-09-11 MED ORDER — FERROUS SULFATE 220 (44 FE) MG/5ML PO ELIX
220.0000 mg | ORAL_SOLUTION | Freq: Every day | ORAL | Status: DC
  Filled 2013-09-11 (×2): qty 5

## 2013-09-11 MED ORDER — ONDANSETRON HCL 4 MG/2ML IJ SOLN: 4.0000 mg | Freq: Four times a day (QID) | INTRAMUSCULAR | Status: DC | PRN

## 2013-09-11 MED ORDER — DEXTROSE 50 % IV SOLN: 25.0000 mL | Freq: Once | INTRAVENOUS | Status: DC

## 2013-09-11 MED ORDER — ACETAMINOPHEN 325 MG PO TABS: 650.0000 mg | ORAL_TABLET | Freq: Four times a day (QID) | ORAL | Status: DC | PRN

## 2013-09-11 MED ORDER — POLYETHYLENE GLYCOL 3350 17 G PO PACK: 17.0000 g | PACK | Freq: Every day | ORAL | Status: DC | PRN

## 2013-09-11 MED ORDER — ENOXAPARIN SODIUM 40 MG/0.4ML ~~LOC~~ SOLN
40.0000 mg | SUBCUTANEOUS | Status: DC
  Administered 2013-09-11 – 2013-09-17 (×6): 40 mg via SUBCUTANEOUS
  Filled 2013-09-11 (×8): qty 0.4

## 2013-09-11 MED ORDER — DEXTROSE 50 % IV SOLN
25.0000 mL | Freq: Once | INTRAVENOUS | Status: AC | PRN
  Administered 2013-09-11: 25 mL via INTRAVENOUS

## 2013-09-11 MED ORDER — SODIUM CHLORIDE 0.9 % IV SOLN
INTRAVENOUS | Status: DC
  Administered 2013-09-11: 17:00:00 via INTRAVENOUS

## 2013-09-11 MED ORDER — CHLORHEXIDINE GLUCONATE CLOTH 2 % EX PADS
6.0000 | MEDICATED_PAD | Freq: Every day | CUTANEOUS | Status: AC
  Administered 2013-09-12 – 2013-09-16 (×5): 6 via TOPICAL

## 2013-09-11 MED ORDER — DEXTROSE 50 % IV SOLN
INTRAVENOUS | Status: AC
  Filled 2013-09-11: qty 50

## 2013-09-11 MED ORDER — VANCOMYCIN HCL 10 G IV SOLR
1500.0000 mg | Freq: Two times a day (BID) | INTRAVENOUS | Status: DC
  Administered 2013-09-12 – 2013-09-13 (×4): 1500 mg via INTRAVENOUS
  Filled 2013-09-11 (×6): qty 1500

## 2013-09-11 MED ORDER — DEXTROSE 50 % IV SOLN
INTRAVENOUS | Status: AC
  Administered 2013-09-11: 25 mL
  Filled 2013-09-11: qty 50

## 2013-09-11 MED ORDER — SERTRALINE HCL 50 MG PO TABS
50.0000 mg | ORAL_TABLET | Freq: Every day | ORAL | Status: DC
  Administered 2013-09-12: 50 mg via ORAL
  Filled 2013-09-11: qty 1

## 2013-09-11 MED ORDER — ALBUTEROL SULFATE (2.5 MG/3ML) 0.083% IN NEBU
2.5000 mg | INHALATION_SOLUTION | Freq: Four times a day (QID) | RESPIRATORY_TRACT | Status: DC
  Administered 2013-09-11 – 2013-09-17 (×22): 2.5 mg via RESPIRATORY_TRACT
  Filled 2013-09-11 (×23): qty 3

## 2013-09-11 MED ORDER — METOCLOPRAMIDE HCL 10 MG PO TABS
5.0000 mg | ORAL_TABLET | Freq: Two times a day (BID) | ORAL | Status: DC
  Administered 2013-09-12 (×2): 5 mg via ORAL
  Filled 2013-09-11 (×2): qty 1

## 2013-09-11 MED ORDER — BIOTENE DRY MOUTH MT LIQD
15.0000 mL | Freq: Two times a day (BID) | OROMUCOSAL | Status: DC
  Administered 2013-09-11 – 2013-09-17 (×12): 15 mL via OROMUCOSAL

## 2013-09-11 MED ORDER — PANTOPRAZOLE SODIUM 40 MG PO TBEC
40.0000 mg | DELAYED_RELEASE_TABLET | Freq: Every day | ORAL | Status: DC
  Administered 2013-09-12: 40 mg via ORAL
  Filled 2013-09-11: qty 1

## 2013-09-11 MED ORDER — ACETAMINOPHEN 650 MG RE SUPP
650.0000 mg | Freq: Four times a day (QID) | RECTAL | Status: DC | PRN
Start: 2013-09-11 — End: 2013-09-13

## 2013-09-11 MED ORDER — SODIUM CHLORIDE 0.9 % IV SOLN
2000.0000 mg | Freq: Once | INTRAVENOUS | Status: AC
Start: 2013-09-11 — End: 2013-09-11
  Administered 2013-09-11: 2000 mg via INTRAVENOUS
  Filled 2013-09-11: qty 2000

## 2013-09-11 MED ORDER — GUAIFENESIN-DM 100-10 MG/5ML PO SYRP: 10.0000 mL | ORAL_SOLUTION | Freq: Four times a day (QID) | ORAL | Status: DC | PRN

## 2013-09-11 MED ORDER — TRAMADOL HCL 50 MG PO TABS: 50.0000 mg | ORAL_TABLET | Freq: Four times a day (QID) | ORAL | Status: DC | PRN

## 2013-09-11 MED ORDER — DEXTROSE 5 % IV SOLN
1.0000 g | Freq: Three times a day (TID) | INTRAVENOUS | Status: DC
  Administered 2013-09-11 – 2013-09-17 (×15): 1 g via INTRAVENOUS
  Filled 2013-09-11 (×20): qty 1

## 2013-09-11 MED ORDER — GUAIFENESIN ER 600 MG PO TB12
600.0000 mg | ORAL_TABLET | Freq: Two times a day (BID) | ORAL | Status: DC
  Administered 2013-09-12: 600 mg via ORAL
  Filled 2013-09-11: qty 1

## 2013-09-11 MED ORDER — HYDROXYZINE HCL 25 MG PO TABS
25.0000 mg | ORAL_TABLET | Freq: Two times a day (BID) | ORAL | Status: DC
  Administered 2013-09-12 (×2): 25 mg via ORAL
  Filled 2013-09-11 (×2): qty 1

## 2013-09-11 MED ORDER — NYSTATIN 100000 UNIT/GM EX POWD
Freq: Two times a day (BID) | CUTANEOUS | Status: DC
  Administered 2013-09-11 – 2013-09-17 (×12): via TOPICAL
  Filled 2013-09-11 (×2): qty 60

## 2013-09-11 MED ORDER — TRIAMCINOLONE ACETONIDE 0.1 % EX CREA
1.0000 "application " | TOPICAL_CREAM | Freq: Two times a day (BID) | CUTANEOUS | Status: DC
  Administered 2013-09-11 – 2013-09-17 (×11): 1 via TOPICAL
  Filled 2013-09-11 (×2): qty 15

## 2013-09-11 MED ORDER — FERROUS SULFATE 300 (60 FE) MG/5ML PO SYRP
300.0000 mg | ORAL_SOLUTION | Freq: Every day | ORAL | Status: DC
  Administered 2013-09-12 – 2013-09-17 (×6): 300 mg
  Filled 2013-09-11 (×9): qty 5

## 2013-09-11 MED ORDER — ALUM & MAG HYDROXIDE-SIMETH 200-200-20 MG/5ML PO SUSP: 30.0000 mL | Freq: Four times a day (QID) | ORAL | Status: DC | PRN

## 2013-09-11 MED ORDER — DEXTROSE 5 % IV SOLN
2.0000 g | INTRAVENOUS | Status: AC
  Administered 2013-09-11: 2 g via INTRAVENOUS

## 2013-09-11 MED ORDER — DEXTROSE-NACL 5-0.45 % IV SOLN
INTRAVENOUS | Status: DC
  Administered 2013-09-11: 23:00:00 via INTRAVENOUS

## 2013-09-11 MED ORDER — JEVITY 1.5 CAL PO LIQD: 1000.0000 mL | Freq: Two times a day (BID) | ORAL | Status: DC

## 2013-09-11 MED ORDER — LEVETIRACETAM 100 MG/ML PO SOLN
250.0000 mg | Freq: Two times a day (BID) | ORAL | Status: DC
  Administered 2013-09-12 (×2): 250 mg via ORAL
  Filled 2013-09-11 (×6): qty 2.5

## 2013-09-11 MED ORDER — MIRTAZAPINE 15 MG PO TABS
7.5000 mg | ORAL_TABLET | Freq: Every day | ORAL | Status: DC
  Administered 2013-09-12: 7.5 mg via ORAL
  Filled 2013-09-11: qty 0.5
  Filled 2013-09-11: qty 1
  Filled 2013-09-11: qty 0.5

## 2013-09-11 NOTE — H&P (Signed)
[facility] History and Physical  [name] KVQ:[id number] DOB: [birth year] DOA: 09/02/2013  Referring physician:  PCP: Tula Nakayama, MD   Chief Complaint: Altered mental status  HPI: [name] is a 80 y.o. female the past medical history that includes stroke with right-sided hemiparesis, diabetes, dysplasia, CHF, PVD, asthma presents to the emergency department from a nursing facility with the chief complaint of altered mental status. Information is obtained from the records and the family is at the bedside. He reports that at baseline patient is alert and oriented and is able to make her wants and needs known. Family reports over the last 3 days patient has gradually become more and more lethargic, and she's developed a cough as well. Family reports patient had an axillary temp yesterday of 101.2. Patient is on oxygen on an as-needed basis and family reports that she has needed oxygen over the last 3 days. There is no report of nausea vomiting diarrhea. Patient is usually incontinent of bladder and bowel. Family reports a fair appetite and had noticed that patient coughs more and clears her throat with eating and drinking. She have a stroke in 2008 which left her with right-sided hemiparesis and a history of dysphasia. He denies her being on a special diet of any kind. In addition her PEG tube has been "leaking". Initial evaluation in the emergency room yields a chest x-ray concerning for pneumonia and a urinalysis consistent with urinary tract infection. CBC yields a white count of 13.2 and a hemoglobin of 9.2. CT of the head without any acute abnormality. EKG with sinus rhythm and first-degree AV block. Her vital signs are stable to somewhat soft blood pressure. Systolic blood pressure ranges 84-105. In the emergency department she was given cefepime and vancomycin per pharmacy. She was also given a liter of normal saline and continuous normal saline was started at 75cc/hour. We are  asked to admit   Review of Systems:  10 point review of systems completed with family and chart. All systems are negative except as indicated by history of present illness Past Medical History  Diagnosis Date  . GERD (gastroesophageal reflux disease)   . Dysphagia 2008    LAST BPE 2011: SEVERE EMD  . Stroke 2010    R HEMIPARESIS  . Left carotid artery occlusion   . Obesity, morbid (more than 100 lbs over ideal weight or BMI > 40)   . Diabetes mellitus   . Encounter for PEG (percutaneous endoscopic gastrostomy) SEP 2011 [facility] RADS  . Restless leg syndrome   . Hyperlipemia   . PVD (peripheral vascular disease)   . Asthma   . CHF (congestive heart failure)   . Edema   . Hemiplegia   . Insomnia   . Anemia   . MRSA infection   . TIA (transient ischemic attack)   . Foreign body in bladder and urethra   . Osteoporosis   . Depression   . Cerebral atherosclerosis    Past Surgical History  Procedure Laterality Date  . Colonoscopy  2008    SIMPLE ADENOMA, bX: NEG FOR MICROSCOPIC COLITIS  . Ventral hernia repair  2005  . Upper gastrointestinal endoscopy  2005 NUR    W/ DILATATION  . Peg placement  07/12/2011 20 FR     MAY 2-20 FR BS BALLOON PEG   Social History:  reports that she has quit smoking. She does not have any smokeless tobacco history on file. She reports that she does not drink alcohol or use illicit  drugs. She is a resident of [facility] and has been since her stroke in 2008. She has right hemiparesis and is max assist Allergies  Allergen Reactions  . Macrobid [Nitrofurantoin] Other (See Comments)    Unknown reactions, allergy noted on MAR  . Penicillins Itching and Rash    History reviewed. No pertinent family history.   Prior to Admission medications   Medication Sig Start Date End Date Taking? Authorizing Provider  Amino Acids-Protein Hydrolys (PRO-STAT 101) LIQD Take 1 Package by mouth 3 (three) times daily.   Yes Historical Provider, MD  ascorbic acid (VITAMIN  C) 500 MG/5ML syrup Place 500 mg into feeding tube 2 (two) times daily.   Yes Historical Provider, MD  Calcium Carbonate (CALCIUM 500 PO) Take 1 tablet by mouth 2 (two) times daily.    Yes Historical Provider, MD  Cranberry 475 MG CAPS Take 475 capsules by mouth daily.   Yes Historical Provider, MD  ferrous sulfate 220 (44 FE) MG/5ML solution Place 220 mg into feeding tube daily.    Yes Historical Provider, MD  Fluticasone-Salmeterol (ADVAIR DISKUS) 250-50 MCG/DOSE AEPB Inhale 1 puff into the lungs every 12 (twelve) hours.     Yes Historical Provider, MD  guaiFENesin (MUCINEX) 600 MG 12 hr tablet Take 600 mg by mouth 2 (two) times daily.    Yes Historical Provider, MD  hydrOXYzine (ATARAX/VISTARIL) 25 MG tablet Take 25 mg by mouth 2 (two) times daily.    Yes Historical Provider, MD  ipratropium-albuterol (DUONEB) 0.5-2.5 (3) MG/3ML SOLN Take 3 mLs by nebulization 2 (two) times daily.   Yes Historical Provider, MD  Jevity 1.5 Cal (JEVITY 1.5) LIQD Place 1,000 mLs into feeding tube 2 (two) times daily.    Yes Historical Provider, MD  levETIRAcetam (KEPPRA) 100 MG/ML solution Take 250 mg by mouth 2 (two) times daily. Take 2.5 mls orally twice a day.   Yes Historical Provider, MD  metoCLOPramide (REGLAN) 5 MG tablet Take 5 mg by mouth 2 (two) times daily.    Yes Historical Provider, MD  mirtazapine (REMERON) 7.5 MG tablet Take 7.5 mg by mouth at bedtime.   Yes Historical Provider, MD  Multiple Vitamins-Iron (MULTIVITAMINS WITH IRON) TABS Take 1 tablet by mouth daily.   Yes Historical Provider, MD  omeprazole (PRILOSEC) 20 MG capsule Take 20 mg by mouth daily.    Yes Historical Provider, MD  sertraline (ZOLOFT) 50 MG tablet Take 50 mg by mouth daily.     Yes Historical Provider, MD  zinc oxide 20 % ointment Apply 1 application topically every evening. Apply to inner thighs every shift for friction   Yes Historical Provider, MD  acetaminophen (TYLENOL) 325 MG tablet Take 650 mg by mouth every 4 (four)  hours as needed. For pain    Historical Provider, MD  clobetasol cream (TEMOVATE) 8.33 % Apply 1 application topically as needed. For rash. Do not apply to face, groin, or under arms    Historical Provider, MD  clotrimazole-betamethasone (LOTRISONE) cream Apply 1 application topically 2 (two) times daily. x4 weeks for rash. Started 12/18/11    Historical Provider, MD  Dextromethorphan-Guaifenesin (ROBAFEN DM) 10-100 MG/5ML liquid Take 10 mLs by mouth every 6 (six) hours as needed. cough    Historical Provider, MD  fluticasone (CUTIVATE) 0.05 % cream Apply 1 application topically 2 (two) times daily as needed. For rash    Historical Provider, MD  levofloxacin (LEVAQUIN) 750 MG tablet Take 750 mg by mouth daily. 09/10/13   Historical Provider, MD  loperamide (IMODIUM A-D) 2 MG tablet Take 4 mg by mouth as needed. diarrhea    Historical Provider, MD  nystatin (MYCOSTATIN) powder Apply topically as needed. Apply to abdominal folds topically as needed for redness and under breasts, bend of legs and groin    Historical Provider, MD  ondansetron (ZOFRAN) 8 MG tablet Take 8 mg by mouth as needed. Nausea,vomiting    Historical Provider, MD  Ondansetron HCl (ZOFRAN) 2 MG/ML SOLN injection Inject 4 mg into the vein every 6 (six) hours as needed. For nausea and vomiting    Historical Provider, MD  phenol (CHLORASEPTIC) 1.4 % LIQD Use as directed 2 sprays in the mouth or throat every 4 (four) hours as needed. For pain    Historical Provider, MD  polyethylene glycol (MIRALAX / GLYCOLAX) packet Take 17 g by mouth daily as needed. For constipation    Historical Provider, MD  Pramoxine HCl-Cleanser (SARNA SENSITIVE) 1 % KIT Apply 1 application topically every 8 (eight) hours as needed. For itching    Historical Provider, MD  traMADol (ULTRAM) 50 MG tablet Take 50 mg by mouth every 6 (six) hours as needed. For pain    Historical Provider, MD  triamcinolone cream (KENALOG) 0.1 % Apply 1 application topically 3 (three)  times daily as needed. For rash    Historical Provider, MD   Physical Exam: Filed Vitals:   09/04/2013 1413  BP: 105/45  Pulse: 88  Temp:   Resp: 20    BP 105/45  Pulse 88  Temp(Src) 98.3 F (36.8 C) (Oral)  Resp 20  SpO2 100%  General: Well-nourished appears her stated age Eyes: PERRL, normal lids, irises & conjunctiva.  ENT: Weakness membranes of her mouth are slightly pale slightly dry. Ears are clear nose without drainage, poor dentition Neck: no LAD, masses or thyromegaly Cardiovascular: S1 and S2. Heart sounds are somewhat distant. I hear no murmur gallop or rub. There is trace lower extremity edema Respiratory: Normal effort. Breath sounds are coarse throughout. I hear rhonchi with faint crackles in bases.. Abdomen: Abdomen is obese but soft. Positive bowel sounds in all 4 quadrants but sluggish. Nontender to palpation. No mass organomegaly are noted. She has a PEG tube the right that has a small amount of greenish purulent drainage. No odor no erythema Skin: no rash or lesions noted Musculoskeletal: Joints without swelling/erythema Psychiatric:  Neurologic: Patient alert but will not follow commands consistently. Answers questions intermittently with a nod during exam.          Labs on Admission:  Basic Metabolic Panel:  Recent Labs Lab 09/25/2013 1141  NA 139  K 4.0  CL 105  CO2 29  GLUCOSE 84  BUN 34*  CREATININE 0.73  CALCIUM 7.4*   Liver Function Tests: No results found for this basename: AST, ALT, ALKPHOS, BILITOT, PROT, ALBUMIN,  in the last 168 hours No results found for this basename: LIPASE, AMYLASE,  in the last 168 hours No results found for this basename: AMMONIA,  in the last 168 hours CBC:  Recent Labs Lab 09/25/2013 1141  WBC 13.2*  NEUTROABS 6.1  HGB 9.2*  HCT 25.5*  MCV 87.6  PLT 267   Cardiac Enzymes: No results found for this basename: CKTOTAL, CKMB, CKMBINDEX, TROPONINI,  in the last 168 hours  BNP (last 3 results) No results  found for this basename: PROBNP,  in the last 8760 hours CBG: No results found for this basename: GLUCAP,  in the last 168 hours  Radiological Exams  on Admission: Dg Chest 1 View  09/26/2013   CLINICAL DATA:  Cough.  Rule out pneumonia  EXAM: CHEST - 1 VIEW  COMPARISON:  01/21/2010  FINDINGS: Decreased lung volume.  Elevated right hemidiaphragm.  Right lower lobe infiltrate may represent atelectasis or pneumonia. Mild left lower lobe atelectasis also noted. No effusion.  IMPRESSION: Hypoventilation with decreased lung volume.  Right lower lobe infiltrate, possible pneumonia   Electronically Signed   By: Franchot Gallo M.D.   On: 09/25/2013 11:29   Ct Head Wo Contrast  09/20/2013   CLINICAL DATA:  Altered mental status  EXAM: CT HEAD WITHOUT CONTRAST  TECHNIQUE: Contiguous axial images were obtained from the base of the skull through the vertex without intravenous contrast.  COMPARISON:  CT 02/10/2010  FINDINGS: Chronic left MCA infarct unchanged from the prior study. Chronic microvascular ischemic changes in the white matter on the right.  Negative for acute infarct.  Negative for hemorrhage or mass lesion.  Sinusitis with mucosal edema and air-fluid levels.  IMPRESSION: Chronic left MCA infarct.  No acute intracranial abnormality.  Sinusitis with air-fluid levels.   Electronically Signed   By: Franchot Gallo M.D.   On: 09/13/2013 11:46    EKG: Independently reviewed sinus rhythm with first degree AV block.  Assessment/Plan Principal Problem:   Acute encephalopathy: Likely rated to infectious process secondary to pneumonia and urinary tract infection. ET of the head shows no acute abnormality. Will admit to medical floor. Will provide vancomycin and cefepime per pharmacy. Active Problems: Healthcare-associated pneumonia: Prior chest x-ray. Vernard Gambles is a resident of[facility] with history of stroke and dysphasia. Some concern for aspiration pneumonia as well. Will check strep pneumo and Legionella antigen.  Will obtain blood cultures if she spikes a temperature. Will provide vancomycin and cefepime per pharmacy. Monitor vital signs every 4 hours x3. Will continue oxygen support.    UTI (lower urinary tract infection): Await urine culture. Gently hydrate with IV fluids. Antibiotic as above.  HYPERTENSION: Review indicates history of hypertension. While in the emergency room patient's blood pressure somewhat soft. Continue gentle IV hydration. No antihypertensives on her home medication list    Leukocytosis: Related to above. Currently she is afebrile and nontoxic appearing. Antibiotics as above. Will monitor closely.  DYSPHAGIA UNSPECIFIED: History of same since her stroke. Concern given right-sided pneumonia. Will request bedside swallow eval    DM w/o Complication Type II: Not on any medications. Family reports patient lost weight over the last several years. Diet controlled. Will check a hemoglobin A1c. Serum glucose 84 on admission  ANEMIA: Normocytic. Current hemoglobin of 9.2 appears to be close to her baseline. No signs and symptoms of active bleeding. Likely related to chronic disease.       CHF: Last echo in 2010 yields an EF of 60-65% with mild concentric hypertrophy. Appears compensated. No home medications for heart failure noted.    CVA: 2008 with right hemiparesis. Will request PT    PERIPHERAL VASCULAR DISEASE: Appears stable at baseline   Morbid obesity      GERD: Continue home PPI          Code Status: Full. Discussed with family Family Communication: Cousin and sister at bedside Disposition Plan: Back to facility  Time spent: 4 minutes  [facility] Pager [phone]

## 2013-09-11 NOTE — ED Notes (Signed)
Pt sent from [facility] for declining condition, AMS, pt is full code

## 2013-09-11 NOTE — ED Notes (Signed)
Pt has residual rt sided weakness from prior CVA, nurse noticed lt sided weakness 3 days ago, concern for new infarct.

## 2013-09-11 NOTE — ED Notes (Signed)
Pts family wants pt to have head CT, nursing staff at [facility] states the pt had chest x-ray and UA both negative, nurses talking with pt's family about DNR, no decision yet.

## 2013-09-11 NOTE — H&P (Signed)
The patient was seen and examined. She was discussed with nurse practitioner, Ms. Renard Hamper. Agree with her assessment and plan with additions below. The patient has been admitted for encephalopathy secondary to healthcare associated pneumonia and urinary tract infection. She is a 80 year old woman with a history of a stroke and right-sided hemiparesis. She also has diabetes and, PVD, and asthma. She has a PEG tube from her previous stroke, but at baseline, she is able to eat a little, but not enough for nutritional needs. Aspiration is a concern. Bedside swallow evaluation was ordered, but we will asked the speech therapist to evaluate her. We'll also hold PEG tube feedings until she is evaluated by the registered dietitian for tube feeding needs. Apparently, the patient is prescribed 1 L of Jevity every 12 hours via PEG tube. Will ask the registered dietitian to evaluate this. Will also insert a Foley catheter as the patient is  incontinent of urine and she has stage II to stage III of her inner thighs.

## 2013-09-11 NOTE — Progress Notes (Signed)
ANTIBIOTIC CONSULT NOTE - INITIAL  Pharmacy Consult for Vancomycin and Cefepime Indication: pneumonia  Allergies  Allergen Reactions  . Macrobid [Nitrofurantoin] Other (See Comments)    Unknown reactions, allergy noted on MAR  . Penicillins Itching and Rash    Patient Measurements: Weight: 235 lb 10.8 oz (106.9 kg)  Vital Signs: Temp: 98.3 F (36.8 C) (01/13 1057) Temp src: Oral (01/13 1057) BP: 105/45 mmHg (01/13 1413) Pulse Rate: 88 (01/13 1413) Intake/Output from previous day:   Intake/Output from this shift:    Labs:  Recent Labs  09/16/2013 1141  WBC 13.2*  HGB 9.2*  PLT 267  CREATININE 0.73   The CrCl is unknown because both a height and weight (above a minimum accepted value) are required for this calculation. No results found for this basename: VANCOTROUGH, VANCOPEAK, VANCORANDOM, GENTTROUGH, GENTPEAK, GENTRANDOM, TOBRATROUGH, TOBRAPEAK, TOBRARND, AMIKACINPEAK, AMIKACINTROU, AMIKACIN,  in the last 72 hours   Microbiology: No results found for this or any previous visit (from the past 720 hour(s)).  Medical History: Past Medical History  Diagnosis Date  . GERD (gastroesophageal reflux disease)   . Dysphagia 2008    LAST BPE 2011: SEVERE EMD  . Stroke 2010    R HEMIPARESIS  . Left carotid artery occlusion   . Obesity, morbid (more than 100 lbs over ideal weight or BMI > 40)   . Diabetes mellitus   . Encounter for PEG (percutaneous endoscopic gastrostomy) SEP 2011 [facility] RADS  . Restless leg syndrome   . Hyperlipemia   . PVD (peripheral vascular disease)   . Asthma   . CHF (congestive heart failure)   . Edema   . Hemiplegia   . Insomnia   . Anemia   . MRSA infection   . TIA (transient ischemic attack)   . Foreign body in bladder and urethra   . Osteoporosis   . Depression   . Cerebral atherosclerosis     Medications:  Scheduled:  . ceFEPime (MAXIPIME) IV  1 g Intravenous Q8H  . enoxaparin (LOVENOX) injection  40 mg Subcutaneous Q24H  .  feeding supplement (JEVITY 1.5 CAL)  1,000 mL Per Tube BID  . ferrous sulfate  220 mg Per Tube Daily  . guaiFENesin  600 mg Oral BID  . hydrOXYzine  25 mg Oral BID  . levETIRAcetam  250 mg Oral BID  . metoCLOPramide  5 mg Oral BID  . mirtazapine  7.5 mg Oral QHS  . mometasone-formoterol  2 puff Inhalation BID  . nystatin   Topical BID  . pantoprazole  40 mg Oral Daily  . sertraline  50 mg Oral Daily  . triamcinolone cream  1 application Topical BID  . [START ON 09/12/2013] vancomycin  1,500 mg Intravenous Q12H   Assessment: 80yo obese female admitted with suspected pneumonia.  Pt has good renal fxn.  SCr 0.73.  Normalized clcr ~ 65-7ml/min.  Goal of Therapy:  Vancomycin trough level 15-20 mcg/ml  Plan:  Cefepime 2gm IV once then 1gm IV q8h Vancomycin 2gm IV once then 1.5gm IV q12h Check trough at steady state Monitor labs, renal fxn, and cultures  Hart Robinsons A 09/03/2013,4:20 PM

## 2013-09-11 NOTE — Progress Notes (Signed)
Hypoglycemic Event  CBG: 69  Treatment: D50 IV 25 mL  Symptoms: None  Follow-up CBG: Time:1900 CBG Result:77  Possible Reasons for Event: Inadequate meal intake  Comments/MD notified:    Don Perking  Remember to initiate Hypoglycemia Order Set & complete

## 2013-09-11 NOTE — Progress Notes (Signed)
Attempted bedside swallow test at this time. Pt would not take anything po at this time. She has a congested cough but seems to be a clear her throat. Will continue to monitor.

## 2013-09-11 NOTE — ED Provider Notes (Signed)
CSN: [id number]     Arrival date & time 09/29/2013  1045 History   This chart was scribed for Nat Christen, MD, by Neta Ehlers, ED Scribe. This patient was seen in room APA02/APA02 and the patient's care was started at 11:03 AM.  First MD Initiated Contact with Patient 09/22/2013 1046     Chief Complaint  Patient presents with  . Failure To Thrive   Level Five Caveat: AMS  The history is provided by medical records and a relative. The history is limited by the condition of the patient. No language interpreter was used.   HPI Comments: [name] is a 80 y.o. female, with a h/o TIA, CHF, and DM, who is brought to the ED from [facility] via EMS, presenting with gradually-increasing AMS, with decreased responsiveness and communication, which began four days ago. Her daughters state that last week she was communicative and laughing, as well as eating and drinking at baseline. They report she also has had a cough and dyspnea.  The pt had a stroke 8 years ago which affected her right arm and right leg as well as her speech. The pt is a former smoker.   Past Medical History  Diagnosis Date  . GERD (gastroesophageal reflux disease)   . Dysphagia 2008    LAST BPE 2011: SEVERE EMD  . Stroke 2010    R HEMIPARESIS  . Left carotid artery occlusion   . Obesity, morbid (more than 100 lbs over ideal weight or BMI > 40)   . Diabetes mellitus   . Encounter for PEG (percutaneous endoscopic gastrostomy) SEP 2011 [facility] RADS  . Restless leg syndrome   . Hyperlipemia   . PVD (peripheral vascular disease)   . Asthma   . CHF (congestive heart failure)   . Edema   . Hemiplegia   . Insomnia   . Anemia   . MRSA infection   . TIA (transient ischemic attack)   . Foreign body in bladder and urethra   . Osteoporosis   . Depression   . Cerebral atherosclerosis    Past Surgical History  Procedure Laterality Date  . Colonoscopy  2008    SIMPLE ADENOMA, bX: NEG FOR MICROSCOPIC COLITIS  . Ventral hernia repair   2005  . Upper gastrointestinal endoscopy  2005 NUR    W/ DILATATION  . Peg placement  07/12/2011 20 FR     MAY 2-20 FR BS BALLOON PEG   History reviewed. No pertinent family history. History  Substance Use Topics  . Smoking status: Former Research scientist (life sciences)  . Smokeless tobacco: Not on file  . Alcohol Use: No   No OB history provided.  Review of Systems  Unable to perform ROS: Mental status change    Allergies  Macrobid and Penicillins  Home Medications   Current Outpatient Rx  Name  Route  Sig  Dispense  Refill  . Amino Acids-Protein Hydrolys (PRO-STAT 101) LIQD   Oral   Take 1 Package by mouth 3 (three) times daily.         Marland Kitchen ascorbic acid (VITAMIN C) 500 MG/5ML syrup   Per Tube   Place 500 mg into feeding tube 2 (two) times daily.         . Calcium Carbonate (CALCIUM 500 PO)   Oral   Take 1 tablet by mouth 2 (two) times daily.          . Cranberry 475 MG CAPS   Oral   Take 475 capsules by mouth daily.         Marland Kitchen  ferrous sulfate 220 (44 FE) MG/5ML solution   Per Tube   Place 220 mg into feeding tube daily.          . Fluticasone-Salmeterol (ADVAIR DISKUS) 250-50 MCG/DOSE AEPB   Inhalation   Inhale 1 puff into the lungs every 12 (twelve) hours.           Marland Kitchen guaiFENesin (MUCINEX) 600 MG 12 hr tablet   Oral   Take 600 mg by mouth 2 (two) times daily.          . hydrOXYzine (ATARAX/VISTARIL) 25 MG tablet   Oral   Take 25 mg by mouth 2 (two) times daily.          Marland Kitchen ipratropium-albuterol (DUONEB) 0.5-2.5 (3) MG/3ML SOLN   Nebulization   Take 3 mLs by nebulization 2 (two) times daily.         . Jevity 1.5 Cal (JEVITY 1.5) LIQD   Per Tube   Place 1,000 mLs into feeding tube 2 (two) times daily.          Marland Kitchen levETIRAcetam (KEPPRA) 100 MG/ML solution   Oral   Take 250 mg by mouth 2 (two) times daily. Take 2.5 mls orally twice a day.         . metoCLOPramide (REGLAN) 5 MG tablet   Oral   Take 5 mg by mouth 2 (two) times daily.          .  mirtazapine (REMERON) 7.5 MG tablet   Oral   Take 7.5 mg by mouth at bedtime.         . Multiple Vitamins-Iron (MULTIVITAMINS WITH IRON) TABS   Oral   Take 1 tablet by mouth daily.         Marland Kitchen omeprazole (PRILOSEC) 20 MG capsule   Oral   Take 20 mg by mouth daily.          . sertraline (ZOLOFT) 50 MG tablet   Oral   Take 50 mg by mouth daily.           Marland Kitchen zinc oxide 20 % ointment   Topical   Apply 1 application topically every evening. Apply to inner thighs every shift for friction         . acetaminophen (TYLENOL) 325 MG tablet   Oral   Take 650 mg by mouth every 4 (four) hours as needed. For pain         . clobetasol cream (TEMOVATE) 0.05 %   Topical   Apply 1 application topically as needed. For rash. Do not apply to face, groin, or under arms         . clotrimazole-betamethasone (LOTRISONE) cream   Topical   Apply 1 application topically 2 (two) times daily. x4 weeks for rash. Started 12/18/11         . Dextromethorphan-Guaifenesin (ROBAFEN DM) 10-100 MG/5ML liquid   Oral   Take 10 mLs by mouth every 6 (six) hours as needed. cough         . fluticasone (CUTIVATE) 0.05 % cream   Topical   Apply 1 application topically 2 (two) times daily as needed. For rash         . levofloxacin (LEVAQUIN) 750 MG tablet   Oral   Take 750 mg by mouth daily.         Marland Kitchen loperamide (IMODIUM A-D) 2 MG tablet   Oral   Take 4 mg by mouth as needed. diarrhea         .  nystatin (MYCOSTATIN) powder   Topical   Apply topically as needed. Apply to abdominal folds topically as needed for redness and under breasts, bend of legs and groin         . ondansetron (ZOFRAN) 8 MG tablet   Oral   Take 8 mg by mouth as needed. Nausea,vomiting         . Ondansetron HCl (ZOFRAN) 2 MG/ML SOLN injection   Intravenous   Inject 4 mg into the vein every 6 (six) hours as needed. For nausea and vomiting         . phenol (CHLORASEPTIC) 1.4 % LIQD   Mouth/Throat   Use as  directed 2 sprays in the mouth or throat every 4 (four) hours as needed. For pain         . polyethylene glycol (MIRALAX / GLYCOLAX) packet   Oral   Take 17 g by mouth daily as needed. For constipation         . Pramoxine HCl-Cleanser (SARNA SENSITIVE) 1 % KIT   Topical   Apply 1 application topically every 8 (eight) hours as needed. For itching         . traMADol (ULTRAM) 50 MG tablet   Oral   Take 50 mg by mouth every 6 (six) hours as needed. For pain         . triamcinolone cream (KENALOG) 0.1 %   Topical   Apply 1 application topically 3 (three) times daily as needed. For rash          Triage Vitals:  BP 97/43  Pulse 90  Temp(Src) 98.3 F (36.8 C) (Oral)  Resp 22  SpO2 100%  Physical Exam  Nursing note and vitals reviewed. Constitutional:  Glazed look. Does not respond to speech or direction.  HENT:  Head: Normocephalic and atraumatic.  Eyes: Conjunctivae and EOM are normal. Pupils are equal, round, and reactive to light.  Neck: Normal range of motion. Neck supple.  Cardiovascular: Normal rate, regular rhythm and normal heart sounds.   Pulmonary/Chest: Effort normal.  Rhonchi.   Abdominal: Soft. Bowel sounds are normal.  Musculoskeletal: Normal range of motion. She exhibits edema.  3+ peripheral edema.   Skin: Skin is warm and dry.  Psychiatric: She has a normal mood and affect. Her behavior is normal.    ED Course  Procedures (including critical care time)  DIAGNOSTIC STUDIES: Oxygen Saturation is 100% on [place], normal by my interpretation.    COORDINATION OF CARE:  11:10 AM- Discussed treatment plan with patient, which included a discussion about heroics, and her daughters denies heroics. The pt's family agreed to the plan.   Labs Review Labs Reviewed  BASIC METABOLIC PANEL - Abnormal; Notable for the following:    BUN 34 (*)    Calcium 7.4 (*)    GFR calc non Af Amer 79 (*)    All other components within normal limits  CBC WITH DIFFERENTIAL -  Abnormal; Notable for the following:    WBC 13.2 (*)    RBC 2.91 (*)    Hemoglobin 9.2 (*)    HCT 25.5 (*)    MCHC 36.1 (*)    RDW 17.3 (*)    Lymphs Abs 5.5 (*)    All other components within normal limits  URINALYSIS, ROUTINE W REFLEX MICROSCOPIC - Abnormal; Notable for the following:    APPearance CLOUDY (*)    Specific Gravity, Urine <1.005 (*)    Hgb urine dipstick SMALL (*)    Leukocytes,  UA LARGE (*)    All other components within normal limits  URINE MICROSCOPIC-ADD ON - Abnormal; Notable for the following:    Squamous Epithelial / LPF FEW (*)    Bacteria, UA MANY (*)    All other components within normal limits  URINE CULTURE   Imaging Review Dg Chest 1 View  09/13/2013   CLINICAL DATA:  Cough.  Rule out pneumonia  EXAM: CHEST - 1 VIEW  COMPARISON:  01/21/2010  FINDINGS: Decreased lung volume.  Elevated right hemidiaphragm.  Right lower lobe infiltrate may represent atelectasis or pneumonia. Mild left lower lobe atelectasis also noted. No effusion.  IMPRESSION: Hypoventilation with decreased lung volume.  Right lower lobe infiltrate, possible pneumonia   Electronically Signed   By: Franchot Gallo M.D.   On: 09/13/2013 11:29   Ct Head Wo Contrast  09/13/2013   CLINICAL DATA:  Altered mental status  EXAM: CT HEAD WITHOUT CONTRAST  TECHNIQUE: Contiguous axial images were obtained from the base of the skull through the vertex without intravenous contrast.  COMPARISON:  CT 02/10/2010  FINDINGS: Chronic left MCA infarct unchanged from the prior study. Chronic microvascular ischemic changes in the white matter on the right.  Negative for acute infarct.  Negative for hemorrhage or mass lesion.  Sinusitis with mucosal edema and air-fluid levels.  IMPRESSION: Chronic left MCA infarct.  No acute intracranial abnormality.  Sinusitis with air-fluid levels.   Electronically Signed   By: Franchot Gallo M.D.   On: 09/25/2013 11:46    EKG Interpretation   None     CRITICAL CARE Performed  by: Nat Christen Total critical care time: 30   Critical care time was exclusive of separately billable procedures and treating other patients. Critical care was necessary to treat or prevent imminent or life-threatening deterioration. Critical care was time spent personally by me on the following activities: development of treatment plan with patient and/or surrogate as well as nursing, discussions with consultants, evaluation of patient's response to treatment, examination of patient, obtaining history from patient or surrogate, ordering and performing treatments and interventions, ordering and review of laboratory studies, ordering and review of radiographic studies, pulse oximetry and re-evaluation of patient's condition.  MDM   1. Healthcare-associated pneumonia   2. UTI (lower urinary tract infection)    Patient is unstable. She has multiple health problems. She lives at [facility]. Chest x-ray shows a right lower lobe infiltrate.   Urine also infected. Rx you Maxipime, Vancomycin.  Admit to gen med  I personally performed the services described in this documentation, which was scribed in my presence. The recorded information has been reviewed and is accurate.      Nat Christen, MD 09/21/2013 [phone]

## 2013-09-11 NOTE — Progress Notes (Signed)
CRITICAL VALUE ALERT  Critical value received:  MRSA + PCR  Date of notification:  09/17/2013  Time of notification:  1830  Critical value read back:yes  Nurse who received alert:  Sharyn Blitz, Rn  MD notified (1st page):  Dr. Caryn Section  Time of first page:  1850  MD notified (2nd page):  Time of second page:  Responding MD:    Time MD responded:     MRSA + PCR protocol initiated

## 2013-09-11 NOTE — Progress Notes (Signed)
Hypoglycemic Event  CBG: 66  Treatment: D50 IV 1ml  Symptoms: none  Follow-up CBG: Time:2120 CBG 91  Possible Reasons for Event: No PO intake at this time/ peg tube issues addressed in the AM  Comments/MD notified:Mid level notified. IV fluids changed    [name] A  Remember to initiate Hypoglycemia Order Set & complete

## 2013-09-12 DIAGNOSIS — E119 Type 2 diabetes mellitus without complications: Secondary | ICD-10-CM

## 2013-09-12 LAB — BASIC METABOLIC PANEL
BUN: 30 mg/dL — ABNORMAL HIGH (ref 6–23)
CO2: 26 mEq/L (ref 19–32)
Calcium: 7.4 mg/dL — ABNORMAL LOW (ref 8.4–10.5)
Chloride: 107 mEq/L (ref 96–112)
Creatinine, Ser: 0.66 mg/dL (ref 0.50–1.10)
GFR calc non Af Amer: 82 mL/min — ABNORMAL LOW (ref >90)
Glucose, Bld: 59 mg/dL — ABNORMAL LOW (ref 70–99)
Potassium: 3.6 mEq/L — ABNORMAL LOW (ref 3.7–5.3)
Sodium: 141 mEq/L (ref 137–147)

## 2013-09-12 LAB — HEMOGLOBIN A1C
Hgb A1c MFr Bld: 4.5 % (ref <5.7)
MEAN PLASMA GLUCOSE: 82 mg/dL (ref <117)

## 2013-09-12 LAB — GLUCOSE, CAPILLARY
GLUCOSE-CAPILLARY: 80 mg/dL (ref 70–99)
Glucose-Capillary: 64 mg/dL — ABNORMAL LOW (ref 70–99)
Glucose-Capillary: 70 mg/dL (ref 70–99)
Glucose-Capillary: 64 mg/dL — ABNORMAL LOW (ref 70–99)
Glucose-Capillary: 77 mg/dL (ref 70–99)

## 2013-09-12 LAB — LEGIONELLA ANTIGEN, URINE: Legionella Antigen, Urine: NEGATIVE

## 2013-09-12 LAB — CBC
HCT: 26.6 % — ABNORMAL LOW (ref 36.0–46.0)
Hemoglobin: 9.3 g/dL — ABNORMAL LOW (ref 12.0–15.0)
MCH: 31.2 pg (ref 26.0–34.0)
MCHC: 35 g/dL (ref 30.0–36.0)
MCV: 89.3 fL (ref 78.0–100.0)
Platelets: 255 10*3/uL (ref 150–400)
RBC: 2.98 MIL/uL — ABNORMAL LOW (ref 3.87–5.11)
RDW: 18.3 % — AB (ref 11.5–15.5)
WBC: 14.4 10*3/uL — ABNORMAL HIGH (ref 4.0–10.5)

## 2013-09-12 LAB — STREP PNEUMONIAE URINARY ANTIGEN: Strep Pneumo Urinary Antigen: NEGATIVE

## 2013-09-12 MED ORDER — PRO-STAT SUGAR FREE PO LIQD
30.0000 mL | Freq: Three times a day (TID) | ORAL | Status: DC
  Administered 2013-09-12 – 2013-09-13 (×4): 30 mL
  Filled 2013-09-12 (×3): qty 30

## 2013-09-12 MED ORDER — JEVITY 1.2 CAL PO LIQD
1000.0000 mL | ORAL | Status: DC
  Administered 2013-09-12 – 2013-09-13 (×2): 1000 mL
  Filled 2013-09-12 (×3): qty 1000

## 2013-09-12 MED ORDER — POTASSIUM CHLORIDE IN NACL 20-0.9 MEQ/L-% IV SOLN
INTRAVENOUS | Status: DC
  Administered 2013-09-12: 16:00:00 via INTRAVENOUS

## 2013-09-12 NOTE — Clinical Social Work Psychosocial (Signed)
Clinical Social Work Department BRIEF PSYCHOSOCIAL ASSESSMENT 09/12/2013  Patient:  [name], [name]     Account Number:  [id number]     Admit date:  09/09/2013  Clinical Social Worker:  Wyatt Haste  Date/Time:  09/12/2013 09:30 AM  Referred by:  Physician  Date Referred:  09/12/2013 Referred for  SNF Placement   Other Referral:   Interview type:  Family Other interview type:   [name]- daughter    PSYCHOSOCIAL DATA Living Status:  FACILITY Admitted from facility:  [facility] Level of care:  [place] Primary support name:  [name] Primary support relationship to patient:  CHILD, ADULT Degree of support available:   supportive    CURRENT CONCERNS Current Concerns  Post-Acute Placement   Other Concerns:    SOCIAL WORK ASSESSMENT / PLAN CSW spoke with pt's daughter, [name] on phone as pt is not oriented. [name] reports pt has been a resident at [facility] for about 3 years after a stroke. [name] and her sister live nearby and check on pt daily at [facility]. She reports things have been going fairly well at SNF and request return there at d/c. CSW spoke with [name] at facility who reports pt is bed bound and total care. Okay for return. Pt admitted with encephalopathy secondary to pneumonia and UTI. Pt has PEG tube.   Assessment/plan status:  Psychosocial Support/Ongoing Assessment of Needs Other assessment/ plan:   Information/referral to community resources:   [facility]    PATIENT'S/FAMILY'S RESPONSE TO PLAN OF CARE: Pt unable to discuss plan of care. Family request return to [facility] when medically stable and were here at admission last night. CSW will continue to follow.       [name], [place]

## 2013-09-12 NOTE — Progress Notes (Signed)
[facility] for Vancomycin and Cefepime Indication: pneumonia  Allergies  Allergen Reactions  . Macrobid [Nitrofurantoin] Other (See Comments)    Unknown reactions, allergy noted on MAR  . Penicillins Itching and Rash   Patient Measurements: Height: 5' 8.5" (174 cm) Weight: 247 lb 2.2 oz (112.1 kg) IBW/kg (Calculated) : 65.05  Vital Signs: Temp: 97.1 F (36.2 C) (01/14 0810) Temp src: Oral (01/14 0810) BP: 93/47 mmHg (01/14 0810) Pulse Rate: 83 (01/14 0810) Intake/Output from previous day: 01/13 0701 - 01/14 0700 In: 143.8 [I.V.:143.8] Out: -  Intake/Output from this shift:    Labs:  Recent Labs  09/14/2013 1141 09/12/13 0505  WBC 13.2* 14.4*  HGB 9.2* 9.3*  PLT 267 255  CREATININE 0.73 0.66   Estimated Creatinine Clearance: 75.5 ml/min (by C-G formula based on Cr of 0.66). No results found for this basename: VANCOTROUGH, VANCOPEAK, VANCORANDOM, GENTTROUGH, GENTPEAK, GENTRANDOM, TOBRATROUGH, TOBRAPEAK, TOBRARND, AMIKACINPEAK, AMIKACINTROU, AMIKACIN,  in the last 72 hours   Microbiology: Recent Results (from the past 720 hour(s))  WOUND CULTURE     Status: None   Collection Time    08/30/2013  4:18 PM      Result Value Range Status   Specimen Description WOUND   Final   Special Requests Normal   Final   Gram Stain     Final   Value: FEW WBC PRESENT, PREDOMINANTLY PMN     FEW SQUAMOUS EPITHELIAL CELLS PRESENT     ABUNDANT GRAM POSITIVE RODS     FEW YEAST     FEW GRAM POSITIVE COCCI IN PAIRS   Culture     Final   Value: NO GROWTH     Performed at [facility]   Report Status PENDING   Incomplete  MRSA PCR SCREENING     Status: Abnormal   Collection Time    09/24/2013  4:19 PM      Result Value Range Status   MRSA by PCR POSITIVE (*) NEGATIVE Final   Comment: RESULT CALLED TO, READ BACK BY AND VERIFIED WITH:     MAYS J. AT 1841 ON [id number] BY THOMPSON S.                The GeneXpert MRSA Assay (FDA     approved for NASAL  specimens     only), is one component of a     comprehensive MRSA colonization     surveillance program. It is not     intended to diagnose MRSA     infection nor to guide or     monitor treatment for     MRSA infections.   Medical History: Past Medical History  Diagnosis Date  . GERD (gastroesophageal reflux disease)   . Dysphagia 2008    LAST BPE 2011: SEVERE EMD  . Stroke 2010    R HEMIPARESIS  . Left carotid artery occlusion   . Obesity, morbid (more than 100 lbs over ideal weight or BMI > 40)   . Diabetes mellitus   . Encounter for PEG (percutaneous endoscopic gastrostomy) SEP 2011 [facility] RADS  . Restless leg syndrome   . Hyperlipemia   . PVD (peripheral vascular disease)   . Asthma   . CHF (congestive heart failure)   . Edema   . Hemiplegia   . Insomnia   . Anemia   . MRSA infection   . TIA (transient ischemic attack)   . Foreign body in bladder and urethra   .  Osteoporosis   . Depression   . Cerebral atherosclerosis    Medications:  Scheduled:  . albuterol  2.5 mg Nebulization Q6H  . antiseptic oral rinse  15 mL Mouth Rinse BID  . ceFEPime (MAXIPIME) IV  1 g Intravenous Q8H  . Chlorhexidine Gluconate Cloth  6 each Topical Q0600  . dextrose  25 mL Intravenous Once  . enoxaparin (LOVENOX) injection  40 mg Subcutaneous Q24H  . feeding supplement (JEVITY 1.2 CAL)  1,000 mL Per Tube Q24H  . ferrous sulfate  300 mg Per Tube Q breakfast  . guaiFENesin  600 mg Oral BID  . hydrOXYzine  25 mg Oral BID  . levETIRAcetam  250 mg Oral BID  . metoCLOPramide  5 mg Oral BID  . mirtazapine  7.5 mg Oral QHS  . mometasone-formoterol  2 puff Inhalation BID  . mupirocin ointment  1 application Nasal BID  . nystatin   Topical BID  . pantoprazole  40 mg Oral Daily  . sertraline  50 mg Oral Daily  . triamcinolone cream  1 application Topical BID  . vancomycin  1,500 mg Intravenous Q12H   Assessment: 80yo obese female admitted with suspected pneumonia.  Pt has good renal fxn.   SCr 0.73.  Normalized clcr ~ 65-49ml/min. Wound cx with GPR f/u ID and sens.  Vancomycin 1/13 >> Cefepime 1/13 >>  Goal of Therapy:  Vancomycin trough level 15-20 mcg/ml  Plan:  Cefepime 2gm IV once then 1gm IV q8h Vancomycin 2gm IV once then 1.5gm IV q12h Check trough at steady state Monitor labs, renal fxn, and cultures  Hart Robinsons A 09/12/2013,11:14 AM

## 2013-09-12 NOTE — Evaluation (Signed)
Clinical/Bedside Swallow Evaluation Patient Details  Name: [name] MRN: [id number] Date of Birth: [birth year]  Today's Date: 09/12/2013 Time: 1000-1030 SLP Time Calculation (min): 30 min  Past Medical History:  Past Medical History  Diagnosis Date  . GERD (gastroesophageal reflux disease)   . Dysphagia 2008    LAST BPE 2011: SEVERE EMD  . Stroke 2010    R HEMIPARESIS  . Left carotid artery occlusion   . Obesity, morbid (more than 100 lbs over ideal weight or BMI > 40)   . Diabetes mellitus   . Encounter for PEG (percutaneous endoscopic gastrostomy) SEP 2011 [facility] RADS  . Restless leg syndrome   . Hyperlipemia   . PVD (peripheral vascular disease)   . Asthma   . CHF (congestive heart failure)   . Edema   . Hemiplegia   . Insomnia   . Anemia   . MRSA infection   . TIA (transient ischemic attack)   . Foreign body in bladder and urethra   . Osteoporosis   . Depression   . Cerebral atherosclerosis    Past Surgical History:  Past Surgical History  Procedure Laterality Date  . Colonoscopy  2008    SIMPLE ADENOMA, bX: NEG FOR MICROSCOPIC COLITIS  . Ventral hernia repair  2005  . Upper gastrointestinal endoscopy  2005 NUR    W/ DILATATION  . Peg placement  07/12/2011 20 FR     MAY 2-20 FR BS BALLOON PEG   HPI:   [name] is a 80 y.o. female the past medical history that includes stroke with right-sided hemiparesis, diabetes, dysplasia, CHF, PVD, asthma presents to the emergency department from a nursing facility with the chief complaint of altered mental status. Information is obtained from the records and the family is at the bedside. He reports that at baseline patient is alert and oriented and is able to make her wants and needs known. Family reports over the last 3 days patient has gradually become more and more lethargic, and she's developed a cough as well. Family reports patient had an axillary temp yesterday of 101.2. Patient is on oxygen on an as-needed basis  and family reports that she has needed oxygen over the last 3 days. There is no report of nausea vomiting diarrhea. Patient is usually incontinent of bladder and bowel. Family reports a fair appetite and had noticed that patient coughs more and clears her throat with eating and drinking. She have a stroke in 2008 which left her with right-sided hemiparesis and a history of dysphasia. He denies her being on a special diet of any kind. In addition her PEG tube has been "leaking".    Assessment / Plan / Recommendation Clinical Impression  Pt seen at bedside today for evaluation of oropharyngeal swallow function. Initially, pt responded with yes/no during introduction, but did not make eye contact. SLP attempted to have pt imitate and respond to verbal commands to see oral motor movements. Pt was unresponsive when asked to do so. SLP attempted to give pt water with spoon, but pt presented very poor awareness of bolus as it ran out of pt's mouth. She demonstrated no labial movement to retrieve bolus from spoon, and did not react to cold temperature of water. When given small amount of applesauce, pt again was unaware of bolus, demonstrated no attempt to manipulate bolus, and did not respond to verbal or tactile cues to swallow. SLP digitally removed applesauce from oral cavity. Due to results of this BSE, it is not recommended  that pt receive PO intake at this time. Therapy not recommended as pt is unable to follow commands. SLP to f/u should alertness improve for possible PO intake.     Aspiration Risk  Severe    Diet Recommendation NPO;Alternative means - long-term                      Swallow Study Prior Functional Status   Pt in LTC facility with prior CVA resulting in PEG placement.     General Date of Onset: 09/04/2013 HPI:  [name] is a 80 y.o. female the past medical history that includes stroke with right-sided hemiparesis, diabetes, dysplasia, CHF, PVD, asthma presents to the  emergency department from a nursing facility with the chief complaint of altered mental status. Information is obtained from the records and the family is at the bedside. He reports that at baseline patient is alert and oriented and is able to make her wants and needs known. Family reports over the last 3 days patient has gradually become more and more lethargic, and she's developed a cough as well. Family reports patient had an axillary temp yesterday of 101.2. Patient is on oxygen on an as-needed basis and family reports that she has needed oxygen over the last 3 days. There is no report of nausea vomiting diarrhea. Patient is usually incontinent of bladder and bowel. Family reports a fair appetite and had noticed that patient coughs more and clears her throat with eating and drinking. She have a stroke in 2008 which left her with right-sided hemiparesis and a history of dysphasia. He denies her being on a special diet of any kind. In addition her PEG tube has been "leaking".  Type of Study: Bedside swallow evaluation Diet Prior to this Study: PEG tube;Other (Comment) (Pt reportedly still eats some, but not enough to maintain adequate nutrition with PO alone.) Behavior/Cognition: Lethargic;Doesn't follow directions Oral Cavity - Dentition: Adequate natural dentition Self-Feeding Abilities: Total assist Patient Positioning: Upright in bed Baseline Vocal Quality: Clear;Low vocal intensity Volitional Cough: Cognitively unable to elicit Volitional Swallow: Unable to elicit    Oral/Motor/Sensory Function Overall Oral Motor/Sensory Function: Other (comment) (Pt would not follow commands to assess oral motor function. Difficult to assess.)   Ice Chips Ice chips: Impaired Presentation: Spoon Oral Phase Impairments: Reduced labial seal;Reduced lingual movement/coordination;Poor awareness of bolus;Other (comment) (Pt did not move lingual or buccal musculature to manipulate bolus. Bolus fell out of  mouth.) Oral Phase Functional Implications: Right anterior spillage   Thin Liquid Thin Liquid: Impaired Presentation: Spoon Oral Phase Impairments: Reduced labial seal;Reduced lingual movement/coordination;Poor awareness of bolus;Other (comment) (Pt did not move lingual or buccal musculature to manipulate bolus. Bolus fell out of mouth) Oral Phase Functional Implications: Right anterior spillage    Nectar Thick Nectar Thick Liquid: Not tested   Honey Thick Honey Thick Liquid: Not tested   Puree Puree: Impaired Presentation: Spoon Oral Phase Impairments: Reduced labial seal;Reduced lingual movement/coordination;Impaired anterior to posterior transit;Impaired mastication;Poor awareness of bolus Oral Phase Functional Implications: Oral holding;Prolonged oral transit;Other (comment) (Pt held bolus in mouth for ~2 minutes while SLP utilized tactile and verbal cues for pt to swallow. Pt stated she felt applesauce in mouth and said "okay" when asked to swallow, but never did. SLP digitally removed bolus from oral cavity.) Pharyngeal Phase Impairments: Suspected delayed Swallow Other Comments: Unable to elicit dry swallow or swallow with liquid/solid bolus; therefore unable to assess pharyngeal phase of swallow.    Solid   GO  Solid: Not tested Other Comments: Not tested d/t results with other textures.        [name] S 09/12/2013,11:20 AM

## 2013-09-12 NOTE — Progress Notes (Signed)
Patient seen and examined. Above note reviewed.  Patient has been admitted with encephalopathy. She has baseline left-sided hemiparesis from prior stroke and her speech is also slurred at baseline. Her family noticed a significant worsening over the past few days to the point that she was unresponsive on the day of admission. She was found to have a urinary tract infection as well as a likely aspiration pneumonia. The patient is on PEG tube feedings. She's been started on appropriate antibiotics as well as IV fluids. This morning, the patient is more engaging and does attempt to participate in conversation. Her speech is still slurred, but family feels that she is significantly improving. We'll hold off pursuing MRI and EEG for now and continue with current treatments.  [name]

## 2013-09-12 NOTE — Care Management Note (Signed)
    Page 1 of 1   09/12/2013     12:06:13 PM   CARE MANAGEMENT NOTE 09/12/2013  Patient:  [name], [name]   Account Number:  [id number]  Date Initiated:  09/12/2013  Documentation initiated by:  Theophilus Kinds  Subjective/Objective Assessment:   Pt admitted from [facility] with pneumonia and UTI. Pt will return to facility at discharge.     Action/Plan:   CSW to arrange discharge to facility when medically stable.   Anticipated DC Date:  09/17/2013   Anticipated DC Plan:  SKILLED NURSING FACILITY  In-house referral  Clinical Social Worker      DC Planning Services  CM consult      Choice offered to / List presented to:             Status of service:  Completed, signed off Medicare Important Message given?   (If response is "NO", the following Medicare IM given date fields will be blank) Date Medicare IM given:   Date Additional Medicare IM given:    Discharge Disposition:  [facility]  Per UR Regulation:    If discussed at Long Length of Stay Meetings, dates discussed:    Comments:  09/12/13 [place], RN BSN CM

## 2013-09-12 NOTE — Progress Notes (Signed)
Pt has a large amount of vaginal discharge, tan and mucous-like. No odor. Will report to oncoming nurse to be evaluated by MD later this morning.

## 2013-09-12 NOTE — Progress Notes (Signed)
INITIAL NUTRITION ASSESSMENT  DOCUMENTATION CODES Per approved criteria  -Obesity Unspecified   INTERVENTION:  Resume enteral nutrition: Continuous Jevity 1.2 @ 40 ml/hr via PEG and add 30 ml  ml Prostat TID.  At goal rate, tube feeding regimen will provide 1452 kcal, 98 grams of protein, and 775 ml of H2O. Will meet 97% minimum estimated energy and 103% protein.   Flushes per Adult Enteral Protocol. If no IVF's will need to add free water 250 ml QID.  IVF-D5 1/2 NS @75  ml/hr provides 306 kcal dextrose.  NUTRITION DIAGNOSIS: Inadequate oral intake related to hemiparesis as evidenced by hx of stroke with right sided hemiparesis with supplemental PEG tube feedings.   Goal: Pt to meet >/= 90% of their estimated nutrition needs   Monitor:  Enteral nutrition tolerance, po intake, labs and weight changes  Reason for Assessment: Initiate/manage tube feeding  80 y.o. female  Admitting Dx: Acute encephalopathy Patient Active Problem List   Diagnosis Date Noted  . Healthcare-associated pneumonia 08-Oct-2013  . Acute encephalopathy 2013/10/08  . UTI (lower urinary tract infection) 10/08/2013  . Leukocytosis 10/08/13  . HCAP (healthcare-associated pneumonia) 2013-10-08  . Encounter for PEG (percutaneous endoscopic gastrostomy) 01/10/2012  . Vomiting 12/23/2011  . CONSTIPATION 08/11/2010  . ANEMIA 11/12/2009  . HYPERTENSION 09/16/2009  . CHF 09/16/2009  . WEIGHT LOSS 09/16/2009  . DYSPHAGIA UNSPECIFIED 09/16/2009  . DIARRHEA 09/16/2009  . CVA 03/27/2009  . SLEEP APNEA 03/26/2009  . EDEMA LEG 03/09/2009  . KNEE PAIN, RIGHT 03/04/2009  . LEG PAIN 03/04/2009  . DIABETIC PERIPHERAL NEUROPATHY 11/16/2008  . UNSPECIFIED URINARY INCONTINENCE 11/16/2008  . DIABETIC ULCER, RIGHT LEG 08/13/2008  . CANDIDIASIS, ORAL 05/17/2008  . FATIGUE 05/14/2008  . DM w/o Complication Type II 95/18/8416  . HYPERLIPIDEMIA 11/24/2007  . Morbid obesity 11/24/2007  . RESTLESS LEG SYNDROME 11/24/2007   . PERIPHERAL VASCULAR DISEASE 11/24/2007  . GERD 11/24/2007  . CELLULITIS, LEG, LEFT 11/24/2007  . OSTEOARTHRITIS, KNEES, BILATERAL 11/24/2007  . INSOMNIA 11/24/2007     ASSESSMENT: Pt is 80 yo who is a resident of [facility]. Her hx includes stroke with hemiparesis, dysphagia,  s/p PEG placement 07/12/11. Pt unable to provide detailed hx. Spoke with her nurse at [facility] who says pt was up in wheelchair until a few weeks ago when she began to refuse to get up. Pt po intake insufficient to maintain adequate nutrition. She has been receiving supplemental tube feeding of Jevity 1.0 @ 50 ml/hr from 2000-1200 daily.  Her wt has been without significant change but nurse reports her po intake has declined. General edema noted. She also has skinbreakown (Stage II to inner thigh) which increases her protein-energy needs. Will change her nutrition support regimen to increase protein intake and minimizes excess calories.   Height: Ht Readings from Last 1 Encounters:  08-Oct-2013 5' 8.5" (1.74 m)    Weight: Wt Readings from Last 1 Encounters:  09/12/13 247 lb 2.2 oz (112.1 kg)    Ideal Body Weight: 140# (47.2kg)  % Ideal Body Weight: 176%  Wt Readings from Last 10 Encounters:  09/12/13 247 lb 2.2 oz (112.1 kg)  01/01/12 255 lb (115.667 kg)  07/08/11 255 lb (115.667 kg)  05/21/10 250 lb (113.399 kg)    Usual Body Weight: 250#  % Usual Body Weight: 99%  BMI:  Body mass index is 37.03 kg/(m^2).obesity class II  Estimated Nutritional Needs: Kcal: 1500-1700 Protein: 80-95 gr Fluid: >1500 ml daily  Skin: pt has two stage II pressure ulcers to inner  thigh  Diet Order: Carb Control  EDUCATION NEEDS: -No education needs identified at this time   Intake/Output Summary (Last 24 hours) at 09/12/13 0924 Last data filed at 09/15/2013 1828  Gross per 24 hour  Intake 143.75 ml  Output      0 ml  Net 143.75 ml    Last BM: PTA  Labs:   Recent Labs Lab 09/08/2013 1141 09/12/13 0505  NA 139  141  K 4.0 3.6*  CL 105 107  CO2 29 26  BUN 34* 30*  CREATININE 0.73 0.66  CALCIUM 7.4* 7.4*  GLUCOSE 84 59*    CBG (last 3)   Recent Labs  09/29/2013 2036 09/14/2013 2135 09/12/13 0751  GLUCAP 66* 91 64*    Scheduled Meds: . albuterol  2.5 mg Nebulization Q6H  . antiseptic oral rinse  15 mL Mouth Rinse BID  . ceFEPime (MAXIPIME) IV  1 g Intravenous Q8H  . Chlorhexidine Gluconate Cloth  6 each Topical Q0600  . dextrose  25 mL Intravenous Once  . enoxaparin (LOVENOX) injection  40 mg Subcutaneous Q24H  . ferrous sulfate  300 mg Per Tube Q breakfast  . guaiFENesin  600 mg Oral BID  . hydrOXYzine  25 mg Oral BID  . levETIRAcetam  250 mg Oral BID  . metoCLOPramide  5 mg Oral BID  . mirtazapine  7.5 mg Oral QHS  . mometasone-formoterol  2 puff Inhalation BID  . mupirocin ointment  1 application Nasal BID  . nystatin   Topical BID  . pantoprazole  40 mg Oral Daily  . sertraline  50 mg Oral Daily  . triamcinolone cream  1 application Topical BID  . vancomycin  1,500 mg Intravenous Q12H    Continuous Infusions: . dextrose 5 % and 0.45% NaCl 75 mL/hr at 09/17/2013 2234    Past Medical History  Diagnosis Date  . GERD (gastroesophageal reflux disease)   . Dysphagia 2008    LAST BPE 2011: SEVERE EMD  . Stroke 2010    R HEMIPARESIS  . Left carotid artery occlusion   . Obesity, morbid (more than 100 lbs over ideal weight or BMI > 40)   . Diabetes mellitus   . Encounter for PEG (percutaneous endoscopic gastrostomy) SEP 2011 [facility] RADS  . Restless leg syndrome   . Hyperlipemia   . PVD (peripheral vascular disease)   . Asthma   . CHF (congestive heart failure)   . Edema   . Hemiplegia   . Insomnia   . Anemia   . MRSA infection   . TIA (transient ischemic attack)   . Foreign body in bladder and urethra   . Osteoporosis   . Depression   . Cerebral atherosclerosis     Past Surgical History  Procedure Laterality Date  . Colonoscopy  2008    SIMPLE ADENOMA, bX: NEG FOR  MICROSCOPIC COLITIS  . Ventral hernia repair  2005  . Upper gastrointestinal endoscopy  2005 NUR    W/ DILATATION  . Peg placement  07/12/2011 20 FR     MAY 2-20 FR BS BALLOON PEG    Colman Cater MS,RD,CSG,LDN Office: [phone] Pager: [phone]

## 2013-09-12 NOTE — Progress Notes (Signed)
[facility] PROGRESS NOTE  [name] EPP:[id number] DOB: [birth year] DOA: 09/22/2013 PCP: Tula Nakayama, MD  Assessment/Plan: Acute encephalopathy: Likely related to infectious process secondary to pneumonia and urinary tract infection. However, given hx and lack of improvement with fluids and antibiotics some concern for stroke and/or seizure. CT of the head showed no acute abnormality. Will obtain MRI and EEG. Continue vancomycin and cefepime day #2 per pharmacy.  Active Problems:  Healthcare-associated pneumonia: Per chest x-ray. She is a resident of [facility] with history of stroke and dysphasia. Some concern for aspiration pneumonia as well. Strep pneumo urinary antigen negative and Legionella antigen in process. Continue vancomycin and cefepime per pharmacy. VSS with a somewhat soft BP. Afebrile.  Will continue oxygen support.   UTI (lower urinary tract infection): Await urine culture. Gently hydrate with IV fluids. Antibiotic as above.   HYPERTENSION: Review indicates history of hypertension. Remains on soft side. Will continue gentle IV hydration. No antihypertensives on her home medication list   Leukocytosis: Related to above. Trending up somewhat.  Currently she is afebrile and nontoxic appearing. Antibiotics as above. Will monitor closely.   DYSPHAGIA UNSPECIFIED: History of same since her stroke. Concern given right-sided pneumonia. ST with bedside eval recommends NPO due to #1.  DM w/o Complication Type II: Not on any medications. Family reports patient lost weight over the last several years. Diet controlled. Hemoglobin A1c 4.5. Serum glucose 84 on admission   ANEMIA: Normocytic. Current hemoglobin of 9.2 appears to be close to her baseline. No signs and symptoms of active bleeding. Likely related to chronic disease.   CHF: Last echo in 2010 yields an EF of 60-65% with mild concentric hypertrophy. remains compensated. No home medications for heart failure noted.   CVA:  2008 with right hemiparesis. Will request PT   PERIPHERAL VASCULAR DISEASE: Appears stable at baseline  Morbid obesity  GERD: Continue home PPI   Code Status: full Family Communication: none present Disposition Plan: back to facility when ready   Consultants:  none  Procedures:  none  Antibiotics:  Vancomycin 09/25/2013>>  Cefepime 09/06/2013>>  HPI/Subjective: Sitting up in bed. Eyes open and alert but only moans to questions. Will not follow commands.   Objective: Filed Vitals:   09/12/13 0810  BP: 93/47  Pulse: 83  Temp: 97.1 F (36.2 C)  Resp: 20    Intake/Output Summary (Last 24 hours) at 09/12/13 1206 Last data filed at 09/12/13 0800  Gross per 24 hour  Intake 143.75 ml  Output      0 ml  Net 143.75 ml   Filed Weights   09/17/2013 1615 09/12/13 0406  Weight: 106.9 kg (235 lb 10.8 oz) 112.1 kg (247 lb 2.2 oz)    Exam:   General:  Obese NAD  Cardiovascular: RRR HS distant. No m/g/r. Trace LE edema. Chronic skin changes to LE  Respiratory: normal effort slightly shallow. No wheeze  Abdomen: obese soft +BS non-tender to palpaton  Musculoskeletal: no clubbing or cyanosis  Data Reviewed: Basic Metabolic Panel:  Recent Labs Lab 09/22/2013 1141 09/12/13 0505  NA 139 141  K 4.0 3.6*  CL 105 107  CO2 29 26  GLUCOSE 84 59*  BUN 34* 30*  CREATININE 0.73 0.66  CALCIUM 7.4* 7.4*   Liver Function Tests: No results found for this basename: AST, ALT, ALKPHOS, BILITOT, PROT, ALBUMIN,  in the last 168 hours No results found for this basename: LIPASE, AMYLASE,  in the last 168 hours No results found for this basename: AMMONIA,  in the last 168 hours CBC:  Recent Labs Lab 09/20/2013 1141 09/12/13 0505  WBC 13.2* 14.4*  NEUTROABS 6.1  --   HGB 9.2* 9.3*  HCT 25.5* 26.6*  MCV 87.6 89.3  PLT 267 255   Cardiac Enzymes: No results found for this basename: CKTOTAL, CKMB, CKMBINDEX, TROPONINI,  in the last 168 hours BNP (last 3 results) No results  found for this basename: PROBNP,  in the last 8760 hours CBG:  Recent Labs Lab 09/14/2013 1905 09/06/2013 2036 09/13/2013 2135 09/12/13 0751 09/12/13 1145  GLUCAP 77 66* 91 64* 80    Recent Results (from the past 240 hour(s))  WOUND CULTURE     Status: None   Collection Time    09/04/2013  4:18 PM      Result Value Range Status   Specimen Description WOUND   Final   Special Requests Normal   Final   Gram Stain     Final   Value: FEW WBC PRESENT, PREDOMINANTLY PMN     FEW SQUAMOUS EPITHELIAL CELLS PRESENT     ABUNDANT GRAM POSITIVE RODS     FEW YEAST     FEW GRAM POSITIVE COCCI IN PAIRS   Culture     Final   Value: NO GROWTH     Performed at [facility]   Report Status PENDING   Incomplete  MRSA PCR SCREENING     Status: Abnormal   Collection Time    09/26/2013  4:19 PM      Result Value Range Status   MRSA by PCR POSITIVE (*) NEGATIVE Final   Comment: RESULT CALLED TO, READ BACK BY AND VERIFIED WITH:     MAYS J. AT 1841 ON NX:[id number] BY THOMPSON S.                The GeneXpert MRSA Assay (FDA     approved for NASAL specimens     only), is one component of a     comprehensive MRSA colonization     surveillance program. It is not     intended to diagnose MRSA     infection nor to guide or     monitor treatment for     MRSA infections.     Studies: Dg Chest 1 View  09/16/2013   CLINICAL DATA:  Cough.  Rule out pneumonia  EXAM: CHEST - 1 VIEW  COMPARISON:  01/21/2010  FINDINGS: Decreased lung volume.  Elevated right hemidiaphragm.  Right lower lobe infiltrate may represent atelectasis or pneumonia. Mild left lower lobe atelectasis also noted. No effusion.  IMPRESSION: Hypoventilation with decreased lung volume.  Right lower lobe infiltrate, possible pneumonia   Electronically Signed   By: Franchot Gallo M.D.   On: 09/17/2013 11:29   Ct Head Wo Contrast  09/28/2013   CLINICAL DATA:  Altered mental status  EXAM: CT HEAD WITHOUT CONTRAST  TECHNIQUE: Contiguous axial images  were obtained from the base of the skull through the vertex without intravenous contrast.  COMPARISON:  CT 02/10/2010  FINDINGS: Chronic left MCA infarct unchanged from the prior study. Chronic microvascular ischemic changes in the white matter on the right.  Negative for acute infarct.  Negative for hemorrhage or mass lesion.  Sinusitis with mucosal edema and air-fluid levels.  IMPRESSION: Chronic left MCA infarct.  No acute intracranial abnormality.  Sinusitis with air-fluid levels.   Electronically Signed   By: Franchot Gallo M.D.   On: 09/17/2013 11:46    Scheduled Meds: . albuterol  2.5 mg Nebulization Q6H  . antiseptic oral rinse  15 mL Mouth Rinse BID  . ceFEPime (MAXIPIME) IV  1 g Intravenous Q8H  . Chlorhexidine Gluconate Cloth  6 each Topical Q0600  . dextrose  25 mL Intravenous Once  . enoxaparin (LOVENOX) injection  40 mg Subcutaneous Q24H  . feeding supplement (JEVITY 1.2 CAL)  1,000 mL Per Tube Q24H  . feeding supplement (PRO-STAT SUGAR FREE 64)  30 mL Per Tube TID  . ferrous sulfate  300 mg Per Tube Q breakfast  . guaiFENesin  600 mg Oral BID  . hydrOXYzine  25 mg Oral BID  . levETIRAcetam  250 mg Oral BID  . metoCLOPramide  5 mg Oral BID  . mirtazapine  7.5 mg Oral QHS  . mometasone-formoterol  2 puff Inhalation BID  . mupirocin ointment  1 application Nasal BID  . nystatin   Topical BID  . pantoprazole  40 mg Oral Daily  . sertraline  50 mg Oral Daily  . triamcinolone cream  1 application Topical BID  . vancomycin  1,500 mg Intravenous Q12H   Continuous Infusions: . 0.9 % NaCl with KCl 20 mEq / L    . dextrose 5 % and 0.45% NaCl 75 mL/hr at 2013/10/02 2234    Principal Problem:   Acute encephalopathy Active Problems:   DM w/o Complication Type II   Morbid obesity   ANEMIA   HYPERTENSION   CHF   CVA   PERIPHERAL VASCULAR DISEASE   GERD   DYSPHAGIA UNSPECIFIED   Healthcare-associated pneumonia   UTI (lower urinary tract infection)   Leukocytosis   HCAP  (healthcare-associated pneumonia)    Time spent: 36 minutes    [facility] Pager [phone] 7PM-7AM, please contact night-coverage at [email or web address], password [facility] 09/12/2013, 12:06 PM  LOS: 1 day

## 2013-09-12 NOTE — Progress Notes (Signed)
UR chart review completed.  

## 2013-09-13 ENCOUNTER — Encounter (HOSPITAL_COMMUNITY): Payer: PRIVATE HEALTH INSURANCE

## 2013-09-13 ENCOUNTER — Inpatient Hospital Stay (HOSPITAL_COMMUNITY): Payer: PRIVATE HEALTH INSURANCE

## 2013-09-13 DIAGNOSIS — J9600 Acute respiratory failure, unspecified whether with hypoxia or hypercapnia: Secondary | ICD-10-CM | POA: Diagnosis present

## 2013-09-13 DIAGNOSIS — R601 Generalized edema: Secondary | ICD-10-CM | POA: Diagnosis present

## 2013-09-13 DIAGNOSIS — E162 Hypoglycemia, unspecified: Secondary | ICD-10-CM | POA: Diagnosis present

## 2013-09-13 DIAGNOSIS — R609 Edema, unspecified: Secondary | ICD-10-CM

## 2013-09-13 LAB — CBC
HEMATOCRIT: 25 % — AB (ref 36.0–46.0)
HEMOGLOBIN: 8.9 g/dL — AB (ref 12.0–15.0)
MCH: 31.7 pg (ref 26.0–34.0)
MCHC: 35.6 g/dL (ref 30.0–36.0)
MCV: 89 fL (ref 78.0–100.0)
Platelets: 192 10*3/uL (ref 150–400)
RBC: 2.81 MIL/uL — ABNORMAL LOW (ref 3.87–5.11)
RDW: 18.2 % — ABNORMAL HIGH (ref 11.5–15.5)
WBC: 12.2 10*3/uL — AB (ref 4.0–10.5)

## 2013-09-13 LAB — GLUCOSE, CAPILLARY
GLUCOSE-CAPILLARY: 74 mg/dL (ref 70–99)
GLUCOSE-CAPILLARY: 130 mg/dL — AB (ref 70–99)
Glucose-Capillary: 78 mg/dL (ref 70–99)
Glucose-Capillary: 71 mg/dL (ref 70–99)
Glucose-Capillary: 61 mg/dL — ABNORMAL LOW (ref 70–99)

## 2013-09-13 LAB — BASIC METABOLIC PANEL
BUN: 31 mg/dL — ABNORMAL HIGH (ref 6–23)
CHLORIDE: 110 meq/L (ref 96–112)
CO2: 23 mEq/L (ref 19–32)
Calcium: 7.2 mg/dL — ABNORMAL LOW (ref 8.4–10.5)
Creatinine, Ser: 0.62 mg/dL (ref 0.50–1.10)
GFR calc non Af Amer: 84 mL/min — ABNORMAL LOW (ref >90)
GLUCOSE: 123 mg/dL — AB (ref 70–99)
POTASSIUM: 3.6 meq/L — AB (ref 3.7–5.3)
SODIUM: 143 meq/L (ref 137–147)

## 2013-09-13 MED ORDER — PRO-STAT SUGAR FREE PO LIQD
30.0000 mL | Freq: Two times a day (BID) | ORAL | Status: DC
  Administered 2013-09-13 – 2013-09-17 (×8): 30 mL
  Filled 2013-09-13 (×8): qty 30

## 2013-09-13 MED ORDER — SERTRALINE HCL 50 MG PO TABS
50.0000 mg | ORAL_TABLET | Freq: Every day | ORAL | Status: DC
  Administered 2013-09-13 – 2013-09-17 (×4): 50 mg
  Filled 2013-09-13 (×4): qty 1

## 2013-09-13 MED ORDER — FUROSEMIDE 20 MG PO TABS
20.0000 mg | ORAL_TABLET | Freq: Two times a day (BID) | ORAL | Status: DC
  Administered 2013-09-13 – 2013-09-14 (×2): 20 mg
  Filled 2013-09-13 (×2): qty 1

## 2013-09-13 MED ORDER — POLYETHYLENE GLYCOL 3350 17 G PO PACK
17.0000 g | PACK | Freq: Every day | ORAL | Status: DC | PRN
  Filled 2013-09-13: qty 1

## 2013-09-13 MED ORDER — MIRTAZAPINE 15 MG PO TABS
7.5000 mg | ORAL_TABLET | Freq: Every day | ORAL | Status: DC
  Administered 2013-09-13 – 2013-09-15 (×3): 7.5 mg
  Filled 2013-09-13 (×2): qty 0.5
  Filled 2013-09-13: qty 1
  Filled 2013-09-13 (×3): qty 0.5

## 2013-09-13 MED ORDER — HYDROXYZINE HCL 25 MG PO TABS
25.0000 mg | ORAL_TABLET | Freq: Two times a day (BID) | ORAL | Status: DC
  Administered 2013-09-13 – 2013-09-17 (×9): 25 mg
  Filled 2013-09-13 (×11): qty 1

## 2013-09-13 MED ORDER — SODIUM CHLORIDE 0.9 % IJ SOLN
10.0000 mL | Freq: Two times a day (BID) | INTRAMUSCULAR | Status: DC
  Administered 2013-09-13: 30 mL
  Administered 2013-09-13: 20 mL
  Administered 2013-09-14: 10 mL
  Administered 2013-09-15: 20 mL
  Administered 2013-09-16: 10 mL

## 2013-09-13 MED ORDER — JEVITY 1.2 CAL PO LIQD
1000.0000 mL | ORAL | Status: DC
  Filled 2013-09-13 (×2): qty 1000

## 2013-09-13 MED ORDER — ALUM & MAG HYDROXIDE-SIMETH 200-200-20 MG/5ML PO SUSP: 30.0000 mL | Freq: Four times a day (QID) | ORAL | Status: DC | PRN

## 2013-09-13 MED ORDER — SODIUM CHLORIDE 0.9 % IJ SOLN: 10.0000 mL | INTRAMUSCULAR | Status: DC | PRN

## 2013-09-13 MED ORDER — GUAIFENESIN-DM 100-10 MG/5ML PO SYRP
10.0000 mL | ORAL_SOLUTION | ORAL | Status: DC | PRN
  Filled 2013-09-13: qty 10

## 2013-09-13 MED ORDER — TRAMADOL HCL 50 MG PO TABS
50.0000 mg | ORAL_TABLET | Freq: Four times a day (QID) | ORAL | Status: DC | PRN
  Administered 2013-09-16: 50 mg
  Filled 2013-09-13: qty 1

## 2013-09-13 MED ORDER — LEVETIRACETAM 100 MG/ML PO SOLN
250.0000 mg | Freq: Two times a day (BID) | ORAL | Status: DC
  Filled 2013-09-13 (×3): qty 2.5

## 2013-09-13 MED ORDER — OSMOLITE 1.5 CAL PO LIQD
1000.0000 mL | ORAL | Status: DC
  Administered 2013-09-13: 1000 mL
  Filled 2013-09-13 (×2): qty 1000

## 2013-09-13 MED ORDER — DEXTROSE 5 % IV SOLN
INTRAVENOUS | Status: DC
  Administered 2013-09-13: 12:00:00 via INTRAVENOUS

## 2013-09-13 MED ORDER — LEVETIRACETAM 100 MG/ML PO SOLN
250.0000 mg | Freq: Two times a day (BID) | ORAL | Status: DC
  Administered 2013-09-13 – 2013-09-17 (×9): 250 mg
  Filled 2013-09-13 (×11): qty 2.5

## 2013-09-13 MED ORDER — ACETAMINOPHEN 325 MG PO TABS
650.0000 mg | ORAL_TABLET | Freq: Four times a day (QID) | ORAL | Status: DC | PRN
  Administered 2013-09-16: 650 mg
  Filled 2013-09-13 (×2): qty 2

## 2013-09-13 MED ORDER — PANTOPRAZOLE SODIUM 40 MG PO PACK
40.0000 mg | PACK | Freq: Every day | ORAL | Status: DC
Start: 2013-09-13 — End: 2013-09-17
  Administered 2013-09-13 – 2013-09-16 (×4): 40 mg
  Filled 2013-09-13 (×6): qty 20

## 2013-09-13 MED ORDER — ACETAMINOPHEN 650 MG RE SUPP: 650.0000 mg | Freq: Four times a day (QID) | RECTAL | Status: DC | PRN

## 2013-09-13 MED ORDER — METOCLOPRAMIDE HCL 5 MG PO TABS
5.0000 mg | ORAL_TABLET | Freq: Two times a day (BID) | ORAL | Status: DC
  Administered 2013-09-13 – 2013-09-17 (×9): 5 mg
  Filled 2013-09-13 (×10): qty 1

## 2013-09-13 NOTE — Progress Notes (Signed)
Patient seen and examined.  Above note reviewed.  Patient continues to be somewhat lethargic and confused. She has had intermittent episodes of hypoglycemia today. Patient is not on any hypoglycemic agents. Her tube feeds have been changed from Jevity 1.2 to Osmolite 1.5 for increased caloric intake. The patient was also receiving dextrose infusion for IV hydration, but due to her significant malnutrition, she has been third spacing her IV fluids and has now developed anasarca as well as pulmonary edema. Her IV fluids has since been discontinued. She has been started on low dose of Lasix as her blood pressure tolerates. She is continued on antibiotics for pneumonia as well as UTI. Her encephalopathy appears to be multifactorial, but is likely driven by her hypoglycemia.  I had an honest discussion with the patient's 2 daughters as well as 1 son regarding her multiple medical problems, current state and expected prognosis. They describe that the patient has had minimal by mouth intake for the past several months. She will only eat approximately 1-2 bites of a sandwich during the course of the entire day. She does not have a desire to meet or drink any liquids. They have noticed a significant decline in the past several weeks to month. They've been noticing that she is becoming increasingly weak. I explained that her inability to maintain her blood sugar is a poor prognostic indicator. Although she does have anasarca and significant peripheral edema, she is still likely intravascularly volume depleted. Giving IV fluids in this situation would be challenging since they will likely worsen her edema. Diuresing the patient will also be challenging due to her marginal blood pressure. Her long-term prognosis certainly appears to be very poor. I explained this to the family and they do appear to understand. They wish to continue with current treatments for the next 24-48 hours to monitor for any improvements. If the  patient fails to improve, I have recommended to withdraw aggressive care and to pursue a comfort/hospice approach. They are agreeable to this. I also discussed a DO NOT RESUSCITATE status and the family does agree to this.  [name]

## 2013-09-13 NOTE — Progress Notes (Signed)
Nutrition Follow-up   INTERVENTION:  Increase continuous Jevity 1.2 to goal rate 50 ml/hr via PEG and 30 ml Prostat BID  providing 1640 kcal, 96 grams of protein, and 968 ml of H2O. Will meet 97% minimum estimated energy and 103% protein.   Flushes per Adult Enteral Protocol. If no IVF's will need to add free water 250 ml QID.  NUTRITION DIAGNOSIS: Inadequate oral intake related to hemiparesis as evidenced by hx of stroke with right sided hemiparesis with supplemental PEG tube feedings.   Goal: Pt to meet >/= 90% of their estimated nutrition needs;met   Monitor:  Enteral nutrition tolerance, ST evaluations/diet advancement, po intake, labs and weight changes  Reason for Assessment: Initiate/manage tube feeding  80 y.o. female  Admitting Dx: Acute encephalopathy Patient Active Problem List   Diagnosis Date Noted  . Hypoglycemia 09/13/2013  . Healthcare-associated pneumonia 09/05/2013  . Acute encephalopathy 09/06/2013  . UTI (lower urinary tract infection) 09/25/2013  . Leukocytosis 09/14/2013  . HCAP (healthcare-associated pneumonia) 09/04/2013  . Encounter for PEG (percutaneous endoscopic gastrostomy) 01/10/2012  . Vomiting 12/23/2011  . CONSTIPATION 08/11/2010  . ANEMIA 11/12/2009  . HYPERTENSION 09/16/2009  . CHF 09/16/2009  . WEIGHT LOSS 09/16/2009  . DYSPHAGIA UNSPECIFIED 09/16/2009  . DIARRHEA 09/16/2009  . CVA 03/27/2009  . SLEEP APNEA 03/26/2009  . EDEMA LEG 03/09/2009  . KNEE PAIN, RIGHT 03/04/2009  . LEG PAIN 03/04/2009  . DIABETIC PERIPHERAL NEUROPATHY 11/16/2008  . UNSPECIFIED URINARY INCONTINENCE 11/16/2008  . DIABETIC ULCER, RIGHT LEG 08/13/2008  . CANDIDIASIS, ORAL 05/17/2008  . FATIGUE 05/14/2008  . DM w/o Complication Type II 35/32/9924  . HYPERLIPIDEMIA 11/24/2007  . Morbid obesity 11/24/2007  . RESTLESS LEG SYNDROME 11/24/2007  . PERIPHERAL VASCULAR DISEASE 11/24/2007  . GERD 11/24/2007  . CELLULITIS, LEG, LEFT 11/24/2007  . OSTEOARTHRITIS,  KNEES, BILATERAL 11/24/2007  . INSOMNIA 11/24/2007     ASSESSMENT: Pt  S/p ST bedside swallow evaluation. She remains NPO at this time due to poor awareness. Afebrile. Tolerating Jevity 1.2 @ 40 ml with protein modular TID. No residuals noted.  Height: Ht Readings from Last 1 Encounters:  09/21/2013 5' 8.5" (1.74 m)    Weight: Wt Readings from Last 1 Encounters:  09/13/13 254 lb 3.1 oz (115.3 kg)    Ideal Body Weight: 140# (47.2kg)  Wt Readings from Last 10 Encounters:  09/13/13 254 lb 3.1 oz (115.3 kg)  01/01/12 255 lb (115.667 kg)  07/08/11 255 lb (115.667 kg)  05/21/10 250 lb (113.399 kg)    Usual Body Weight: 250#  BMI:  Body mass index is 38.08 kg/(m^2).obesity class II  Estimated Nutritional Needs: Kcal: 1500-1700 Protein: 80-95 gr Fluid: >1500 ml daily  Skin: pt has two stage II pressure ulcers to inner thigh  Diet Order:    EDUCATION NEEDS: -No education needs identified at this time   Intake/Output Summary (Last 24 hours) at 09/13/13 1143 Last data filed at 09/13/13 0600  Gross per 24 hour  Intake      0 ml  Output    575 ml  Net   -575 ml    Last BM: PTA  Labs:   Recent Labs Lab 09/04/2013 1141 09/12/13 0505  NA 139 141  K 4.0 3.6*  CL 105 107  CO2 29 26  BUN 34* 30*  CREATININE 0.73 0.66  CALCIUM 7.4* 7.4*  GLUCOSE 84 59*    CBG (last 3)   Recent Labs  09/12/13 2256 09/13/13 0603 09/13/13 0758  GLUCAP 77 78 71  Scheduled Meds: . albuterol  2.5 mg Nebulization Q6H  . antiseptic oral rinse  15 mL Mouth Rinse BID  . ceFEPime (MAXIPIME) IV  1 g Intravenous Q8H  . Chlorhexidine Gluconate Cloth  6 each Topical Q0600  . dextrose  25 mL Intravenous Once  . enoxaparin (LOVENOX) injection  40 mg Subcutaneous Q24H  . feeding supplement (JEVITY 1.2 CAL)  1,000 mL Per Tube Q24H  . feeding supplement (PRO-STAT SUGAR FREE 64)  30 mL Per Tube TID  . ferrous sulfate  300 mg Per Tube Q breakfast  . hydrOXYzine  25 mg Per Tube BID  .  levETIRAcetam  250 mg Per Tube BID  . metoCLOPramide  5 mg Per Tube BID  . mirtazapine  7.5 mg Per Tube QHS  . mometasone-formoterol  2 puff Inhalation BID  . mupirocin ointment  1 application Nasal BID  . nystatin   Topical BID  . pantoprazole sodium  40 mg Per Tube Daily  . sertraline  50 mg Per Tube Daily  . sodium chloride  10-40 mL Intracatheter Q12H  . triamcinolone cream  1 application Topical BID  . vancomycin  1,500 mg Intravenous Q12H    Continuous Infusions: . dextrose 50 mL/hr at 09/13/13 1133  . dextrose 5 % and 0.45% NaCl 75 mL/hr at 09/09/2013 2234    Past Medical History  Diagnosis Date  . GERD (gastroesophageal reflux disease)   . Dysphagia 2008    LAST BPE 2011: SEVERE EMD  . Stroke 2010    R HEMIPARESIS  . Left carotid artery occlusion   . Obesity, morbid (more than 100 lbs over ideal weight or BMI > 40)   . Diabetes mellitus   . Encounter for PEG (percutaneous endoscopic gastrostomy) SEP 2011 [facility] RADS  . Restless leg syndrome   . Hyperlipemia   . PVD (peripheral vascular disease)   . Asthma   . CHF (congestive heart failure)   . Edema   . Hemiplegia   . Insomnia   . Anemia   . MRSA infection   . TIA (transient ischemic attack)   . Foreign body in bladder and urethra   . Osteoporosis   . Depression   . Cerebral atherosclerosis     Past Surgical History  Procedure Laterality Date  . Colonoscopy  2008    SIMPLE ADENOMA, bX: NEG FOR MICROSCOPIC COLITIS  . Ventral hernia repair  2005  . Upper gastrointestinal endoscopy  2005 NUR    W/ DILATATION  . Peg placement  07/12/2011 20 FR     MAY 2-20 FR BS BALLOON PEG    Colman Cater MS,RD,CSG,LDN Office: [phone] Pager: [phone]

## 2013-09-13 NOTE — Progress Notes (Signed)
[facility] PROGRESS NOTE  [name] [id number] DOB: November 15, 1931 DOA: 09/28/2013 PCP: Tula Nakayama, MD  Assessment/Plan: Acute encephalopathy: Likely related to infectious process secondary to pneumonia and urinary tract infection and or hypglycemia. However, given hx and lack of improvement with fluids and antibiotics some concern for stroke and/or seizure. CT of the head showed no acute abnormality. Seems to perk up when CBG>70. Tube feedings restarted yesterday. CBG this am still just 71. Much improved this am. Will follow commands. Will change IV fluids to D5W. Labs pending this am as unable to get blood this am therefore PICC line ordered.  Vancomycin and cefepime day #3 per pharmacy.  Active Problems:   Healthcare-associated pneumonia: Per chest x-ray. She is a resident of [facility] with history of stroke and dysphasia. Some concern for aspiration pneumonia as well. Strep pneumo urinary antigen negative and Legionella antigen in process. Continue vancomycin and cefepime per pharmacy. VSS with a somewhat soft BP. Afebrile. Will continue oxygen support.   UTI (lower urinary tract infection): Await urine culture. Gently hydrate with IV fluids. Antibiotic as above.   Hypoglycemia: etiology uncertain. May be related to interruption of tube feeds and decreased po intake in setting of pna and UTI. Feedings started 09/12/13. Will change fluid to D5W. Will feed when fully alert.   HYPERTENSION: Review indicates history of hypertension. Remains on soft side. Will continue gentle IV hydration. No antihypertensives on her home medication list   Leukocytosis: Related to above. Labs this am peniding. Currently she is afebrile and nontoxic appearing. Antibiotics as above. Will monitor closely.   DYSPHAGIA UNSPECIFIED: History of same since her stroke. Concern given right-sided pneumonia. ST with bedside eval recommends NPO due to #1. Will resume diet once alert  DM w/o Complication Type II:  Not on any medications. Family reports patient lost weight over the last several years. Diet controlled. Hemoglobin A1c 4.5. Serum glucose 84 on admission. See above  ANEMIA: Normocytic. Current hemoglobin of 9.2 appears to be close to her baseline. No signs and symptoms of active bleeding. Likely related to chronic disease.   CHF: Last echo in 2010 yields an EF of 60-65% with mild concentric hypertrophy. remains compensated. No home medications for heart failure noted.   CVA: 2008 with right hemiparesis. Will request PT   PERIPHERAL VASCULAR DISEASE: Appears stable at baseline  Morbid obesity  GERD: Continue home PPI    Code Status: full Family Communication: none present Disposition Plan: back to facility when ready   Consultants:  none  Procedures:  none  Antibiotics: Vancomycin 09/17/2013>>  Cefepime 09/15/2013>>   HPI/Subjective:   Objective: Filed Vitals:   09/13/13 0447  BP: 94/62  Pulse: 90  Temp: 98.1 F (36.7 C)  Resp: 20    Intake/Output Summary (Last 24 hours) at 09/13/13 1053 Last data filed at 09/13/13 0600  Gross per 24 hour  Intake      0 ml  Output    575 ml  Net   -575 ml   Filed Weights   09/17/2013 1615 09/12/13 0406 09/13/13 0447  Weight: 106.9 kg (235 lb 10.8 oz) 112.1 kg (247 lb 2.2 oz) 115.3 kg (254 lb 3.1 oz)    Exam:   General:  Obese calm   Cardiovascular: RRR No MGR No LE edema. Chronic changes to skin LE  Respiratory: normal effort BS distant but clear no wheeze  Abdomen: obese soft +BS PEF intact  Musculoskeletal: no clubbing or cyanosos  Neuro: opens eyes to verbal stimuli. Attempts to  follow commands. Speech slow but at baseline.    Data Reviewed: Basic Metabolic Panel:  Recent Labs Lab September 12, 2013 1141 09/12/13 0505  NA 139 141  K 4.0 3.6*  CL 105 107  CO2 29 26  GLUCOSE 84 59*  BUN 34* 30*  CREATININE 0.73 0.66  CALCIUM 7.4* 7.4*   Liver Function Tests: No results found for this basename: AST, ALT, ALKPHOS,  BILITOT, PROT, ALBUMIN,  in the last 168 hours No results found for this basename: LIPASE, AMYLASE,  in the last 168 hours No results found for this basename: AMMONIA,  in the last 168 hours CBC:  Recent Labs Lab September 12, 2013 1141 09/12/13 0505  WBC 13.2* 14.4*  NEUTROABS 6.1  --   HGB 9.2* 9.3*  HCT 25.5* 26.6*  MCV 87.6 89.3  PLT 267 255   Cardiac Enzymes: No results found for this basename: CKTOTAL, CKMB, CKMBINDEX, TROPONINI,  in the last 168 hours BNP (last 3 results) No results found for this basename: PROBNP,  in the last 8760 hours CBG:  Recent Labs Lab 09/12/13 1657 09/12/13 2149 09/12/13 2256 09/13/13 0603 09/13/13 0758  GLUCAP 70 64* 77 78 71    Recent Results (from the past 240 hour(s))  URINE CULTURE     Status: None   Collection Time    09/12/13 12:17 PM      Result Value Range Status   Specimen Description URINE, CATHETERIZED   Final   Special Requests NONE   Final   Culture  Setup Time     Final   Value: 09/12/2013 00:48     Performed at [place] PENDING   Incomplete   Culture     Final   Value: Culture reincubated for better growth     Performed at [facility]   Report Status PENDING   Incomplete  WOUND CULTURE     Status: None   Collection Time    09-12-2013  4:18 PM      Result Value Range Status   Specimen Description DRAINAGE PEG SITE   Final   Special Requests NONE   Final   Gram Stain     Final   Value: FEW WBC PRESENT, PREDOMINANTLY PMN     FEW SQUAMOUS EPITHELIAL CELLS PRESENT     ABUNDANT GRAM POSITIVE RODS     FEW YEAST     FEW GRAM POSITIVE COCCI IN PAIRS   Culture     Final   Value: Culture reincubated for better growth     Performed at [facility]   Report Status PENDING   Incomplete  MRSA PCR SCREENING     Status: Abnormal   Collection Time    09/12/2013  4:19 PM      Result Value Range Status   MRSA by PCR POSITIVE (*) NEGATIVE Final   Comment: RESULT CALLED TO, READ BACK BY AND  VERIFIED WITH:     MAYS J. AT 1841 ON [id number] BY THOMPSON S.                The GeneXpert MRSA Assay (FDA     approved for NASAL specimens     only), is one component of a     comprehensive MRSA colonization     surveillance program. It is not     intended to diagnose MRSA     infection nor to guide or     monitor treatment for     MRSA infections.  Studies: Dg Chest 1 View  09/01/2013   CLINICAL DATA:  Cough.  Rule out pneumonia  EXAM: CHEST - 1 VIEW  COMPARISON:  01/21/2010  FINDINGS: Decreased lung volume.  Elevated right hemidiaphragm.  Right lower lobe infiltrate may represent atelectasis or pneumonia. Mild left lower lobe atelectasis also noted. No effusion.  IMPRESSION: Hypoventilation with decreased lung volume.  Right lower lobe infiltrate, possible pneumonia   Electronically Signed   By: Franchot Gallo M.D.   On: 09/22/2013 11:29   Ct Head Wo Contrast  09/23/2013   CLINICAL DATA:  Altered mental status  EXAM: CT HEAD WITHOUT CONTRAST  TECHNIQUE: Contiguous axial images were obtained from the base of the skull through the vertex without intravenous contrast.  COMPARISON:  CT 02/10/2010  FINDINGS: Chronic left MCA infarct unchanged from the prior study. Chronic microvascular ischemic changes in the white matter on the right.  Negative for acute infarct.  Negative for hemorrhage or mass lesion.  Sinusitis with mucosal edema and air-fluid levels.  IMPRESSION: Chronic left MCA infarct.  No acute intracranial abnormality.  Sinusitis with air-fluid levels.   Electronically Signed   By: Franchot Gallo M.D.   On: 08/30/2013 11:46    Scheduled Meds: . albuterol  2.5 mg Nebulization Q6H  . antiseptic oral rinse  15 mL Mouth Rinse BID  . ceFEPime (MAXIPIME) IV  1 g Intravenous Q8H  . Chlorhexidine Gluconate Cloth  6 each Topical Q0600  . dextrose  25 mL Intravenous Once  . enoxaparin (LOVENOX) injection  40 mg Subcutaneous Q24H  . feeding supplement (JEVITY 1.2 CAL)  1,000 mL Per Tube  Q24H  . feeding supplement (PRO-STAT SUGAR FREE 64)  30 mL Per Tube TID  . ferrous sulfate  300 mg Per Tube Q breakfast  . hydrOXYzine  25 mg Per Tube BID  . levETIRAcetam  250 mg Per Tube BID  . metoCLOPramide  5 mg Per Tube BID  . mirtazapine  7.5 mg Per Tube QHS  . mometasone-formoterol  2 puff Inhalation BID  . mupirocin ointment  1 application Nasal BID  . nystatin   Topical BID  . pantoprazole sodium  40 mg Per Tube Daily  . sertraline  50 mg Per Tube Daily  . triamcinolone cream  1 application Topical BID  . vancomycin  1,500 mg Intravenous Q12H   Continuous Infusions: . 0.9 % NaCl with KCl 20 mEq / L 75 mL/hr at 09/12/13 1543  . dextrose 5 % and 0.45% NaCl 75 mL/hr at 09/12/2013 2234    Principal Problem:   Acute encephalopathy Active Problems:   DM w/o Complication Type II   Morbid obesity   ANEMIA   HYPERTENSION   CHF   CVA   PERIPHERAL VASCULAR DISEASE   GERD   DYSPHAGIA UNSPECIFIED   Healthcare-associated pneumonia   UTI (lower urinary tract infection)   Leukocytosis   HCAP (healthcare-associated pneumonia)    Time spent: 96 minutes    [facility] Pager [phone]. If 7PM-7AM, please contact night-coverage at [email or web address], password [facility] 09/13/2013, 10:53 AM  LOS: 2 days

## 2013-09-14 DIAGNOSIS — J9600 Acute respiratory failure, unspecified whether with hypoxia or hypercapnia: Secondary | ICD-10-CM

## 2013-09-14 DIAGNOSIS — E43 Unspecified severe protein-calorie malnutrition: Secondary | ICD-10-CM | POA: Diagnosis present

## 2013-09-14 DIAGNOSIS — J810 Acute pulmonary edema: Secondary | ICD-10-CM | POA: Diagnosis not present

## 2013-09-14 LAB — COMPREHENSIVE METABOLIC PANEL
ALBUMIN: 0.6 g/dL — AB (ref 3.5–5.2)
ALT: 13 U/L (ref 0–35)
AST: 17 U/L (ref 0–37)
Alkaline Phosphatase: 193 U/L — ABNORMAL HIGH (ref 39–117)
BUN: 32 mg/dL — AB (ref 6–23)
CALCIUM: 7.1 mg/dL — AB (ref 8.4–10.5)
CO2: 22 mEq/L (ref 19–32)
Chloride: 112 mEq/L (ref 96–112)
Creatinine, Ser: 0.63 mg/dL (ref 0.50–1.10)
GFR calc non Af Amer: 83 mL/min — ABNORMAL LOW (ref >90)
GLUCOSE: 161 mg/dL — AB (ref 70–99)
Potassium: 3.4 mEq/L — ABNORMAL LOW (ref 3.7–5.3)
SODIUM: 145 meq/L (ref 137–147)
TOTAL PROTEIN: 4.9 g/dL — AB (ref 6.0–8.3)
Total Bilirubin: 0.2 mg/dL — ABNORMAL LOW (ref 0.3–1.2)

## 2013-09-14 LAB — GLUCOSE, CAPILLARY
GLUCOSE-CAPILLARY: 148 mg/dL — AB (ref 70–99)
GLUCOSE-CAPILLARY: 153 mg/dL — AB (ref 70–99)
GLUCOSE-CAPILLARY: 125 mg/dL — AB (ref 70–99)
GLUCOSE-CAPILLARY: 112 mg/dL — AB (ref 70–99)
Glucose-Capillary: 109 mg/dL — ABNORMAL HIGH (ref 70–99)
Glucose-Capillary: 128 mg/dL — ABNORMAL HIGH (ref 70–99)
Glucose-Capillary: 179 mg/dL — ABNORMAL HIGH (ref 70–99)

## 2013-09-14 LAB — VANCOMYCIN, RANDOM: VANCOMYCIN RM: 39.5 ug/mL

## 2013-09-14 LAB — VANCOMYCIN, TROUGH: Vancomycin Tr: 48 ug/mL (ref 10.0–20.0)

## 2013-09-14 MED ORDER — INSULIN ASPART 100 UNIT/ML ~~LOC~~ SOLN: 0.0000 [IU] | Freq: Every day | SUBCUTANEOUS | Status: DC

## 2013-09-14 MED ORDER — VANCOMYCIN HCL 10 G IV SOLR
1500.0000 mg | INTRAVENOUS | Status: DC
  Administered 2013-09-16: 1500 mg via INTRAVENOUS
  Filled 2013-09-14 (×2): qty 1500

## 2013-09-14 MED ORDER — INSULIN ASPART 100 UNIT/ML ~~LOC~~ SOLN
0.0000 [IU] | Freq: Three times a day (TID) | SUBCUTANEOUS | Status: DC
  Administered 2013-09-14: 2 [IU] via SUBCUTANEOUS
  Administered 2013-09-14: 1 [IU] via SUBCUTANEOUS
  Administered 2013-09-15 (×2): 2 [IU] via SUBCUTANEOUS
  Administered 2013-09-15 – 2013-09-16 (×2): 1 [IU] via SUBCUTANEOUS
  Administered 2013-09-17: 3 [IU] via SUBCUTANEOUS
  Administered 2013-09-17: 2 [IU] via SUBCUTANEOUS

## 2013-09-14 MED ORDER — FREE WATER
250.0000 mL | Freq: Four times a day (QID) | Status: DC
  Administered 2013-09-14 – 2013-09-15 (×5): 250 mL

## 2013-09-14 MED ORDER — FUROSEMIDE 40 MG PO TABS
40.0000 mg | ORAL_TABLET | Freq: Two times a day (BID) | ORAL | Status: DC
  Filled 2013-09-14 (×2): qty 1

## 2013-09-14 MED ORDER — POTASSIUM CHLORIDE 20 MEQ/15ML (10%) PO LIQD
40.0000 meq | Freq: Once | ORAL | Status: AC
  Administered 2013-09-14: 40 meq
  Filled 2013-09-14: qty 30

## 2013-09-14 NOTE — Progress Notes (Addendum)
Nutrition Follow-up   INTERVENTION:  Continuous Osmolite 1.5 @ 51m/hr via PEG and 30 ml Prostat BID is providing 2000 kcal, 105 grams of protein, and 914 ml of H2O.   Fluids per MD goals  NUTRITION DIAGNOSIS: Inadequate oral intake related to hemiparesis as evidenced by hx of stroke with right sided hemiparesis with supplemental PEG tube feedings.   Goal: Pt to meet >/= 90% of their estimated nutrition needs;met   Monitor:  Enteral nutrition tolerance, ST evaluations/diet advancement, po intake, labs and weight changes  80y.o. female  Admitting Dx: Acute encephalopathy Patient Active Problem List   Diagnosis Date Noted  . Acute pulmonary edema 09/14/2013  . Hypoglycemia 09/13/2013  . Acute respiratory failure 09/13/2013  . Anasarca 09/13/2013  . Healthcare-associated pneumonia 09/17/2013  . Acute encephalopathy 09/17/2013  . UTI (lower urinary tract infection) 09/01/2013  . Leukocytosis 08/30/2013  . HCAP (healthcare-associated pneumonia) 09/23/2013  . Encounter for PEG (percutaneous endoscopic gastrostomy) 01/10/2012  . Vomiting 12/23/2011  . CONSTIPATION 08/11/2010  . ANEMIA 11/12/2009  . HYPERTENSION 09/16/2009  . Acute on chronic diastolic CHF (congestive heart failure) 09/16/2009  . WEIGHT LOSS 09/16/2009  . DYSPHAGIA UNSPECIFIED 09/16/2009  . DIARRHEA 09/16/2009  . CVA 03/27/2009  . SLEEP APNEA 03/26/2009  . EDEMA LEG 03/09/2009  . KNEE PAIN, RIGHT 03/04/2009  . LEG PAIN 03/04/2009  . DIABETIC PERIPHERAL NEUROPATHY 11/16/2008  . UNSPECIFIED URINARY INCONTINENCE 11/16/2008  . DIABETIC ULCER, RIGHT LEG 08/13/2008  . CANDIDIASIS, ORAL 05/17/2008  . FATIGUE 05/14/2008  . DM w/o Complication Type II 084/66/5993 . HYPERLIPIDEMIA 11/24/2007  . Morbid obesity 11/24/2007  . RESTLESS LEG SYNDROME 11/24/2007  . PERIPHERAL VASCULAR DISEASE 11/24/2007  . GERD 11/24/2007  . CELLULITIS, LEG, LEFT 11/24/2007  . OSTEOARTHRITIS, KNEES, BILATERAL 11/24/2007  . INSOMNIA  11/24/2007     ASSESSMENT: Enteral nutrition formula changed. Pt has become DNR now. MD has discussed pt condition and family is agreeable to proceed with comfort care if pt is not responding to treatments.   Height: Ht Readings from Last 1 Encounters:  09/08/2013 5' 8.5" (1.74 m)    Weight: Wt Readings from Last 1 Encounters:  09/14/13 242 lb 8.1 oz (110 kg)    Ideal Body Weight: 140# (47.2kg)  Wt Readings from Last 10 Encounters:  09/14/13 242 lb 8.1 oz (110 kg)  01/01/12 255 lb (115.667 kg)  07/08/11 255 lb (115.667 kg)  05/21/10 250 lb (113.399 kg)    Usual Body Weight: 250#  BMI:  Body mass index is 36.33 kg/(m^2).obesity class II  Estimated Nutritional Needs: Kcal: 1600-1800 Protein: 85-95 gr Fluid: >1500 ml daily  Skin: pt has two stage II pressure ulcers to inner thigh  Diet Order:    EDUCATION NEEDS: -No education needs identified at this time   Intake/Output Summary (Last 24 hours) at 09/14/13 1510 Last data filed at 09/14/13 1142  Gross per 24 hour  Intake      0 ml  Output    351 ml  Net   -351 ml    Last BM: 09/13/13  Labs:   Recent Labs Lab 09/12/13 0505 09/13/13 1236 09/14/13 0445  NA 141 143 145  K 3.6* 3.6* 3.4*  CL 107 110 112  CO2 26 23 22   BUN 30* 31* 32*  CREATININE 0.66 0.62 0.63  CALCIUM 7.4* 7.2* 7.1*  GLUCOSE 59* 123* 161*    CBG (last 3)   Recent Labs  09/14/13 0414 09/14/13 0834 09/14/13 1135  GLUCAP 148* 179* 153*  Scheduled Meds: . albuterol  2.5 mg Nebulization Q6H  . antiseptic oral rinse  15 mL Mouth Rinse BID  . ceFEPime (MAXIPIME) IV  1 g Intravenous Q8H  . Chlorhexidine Gluconate Cloth  6 each Topical Q0600  . dextrose  25 mL Intravenous Once  . enoxaparin (LOVENOX) injection  40 mg Subcutaneous Q24H  . feeding supplement (PRO-STAT SUGAR FREE 64)  30 mL Per Tube BID  . ferrous sulfate  300 mg Per Tube Q breakfast  . free water  250 mL Per Tube Q6H  . furosemide  40 mg Per Tube BID  .  hydrOXYzine  25 mg Per Tube BID  . insulin aspart  0-5 Units Subcutaneous QHS  . insulin aspart  0-9 Units Subcutaneous TID WC  . levETIRAcetam  250 mg Per Tube BID  . metoCLOPramide  5 mg Per Tube BID  . mirtazapine  7.5 mg Per Tube QHS  . mometasone-formoterol  2 puff Inhalation BID  . mupirocin ointment  1 application Nasal BID  . nystatin   Topical BID  . pantoprazole sodium  40 mg Per Tube Daily  . sertraline  50 mg Per Tube Daily  . sodium chloride  10-40 mL Intracatheter Q12H  . triamcinolone cream  1 application Topical BID  . [START ON 09/15/2013] vancomycin  1,500 mg Intravenous Q48H    Continuous Infusions: . feeding supplement (OSMOLITE 1.5 CAL) 1,000 mL (09/13/13 1711)    Past Medical History  Diagnosis Date  . GERD (gastroesophageal reflux disease)   . Dysphagia 2008    LAST BPE 2011: SEVERE EMD  . Stroke 2010    R HEMIPARESIS  . Left carotid artery occlusion   . Obesity, morbid (more than 100 lbs over ideal weight or BMI > 40)   . Diabetes mellitus   . Encounter for PEG (percutaneous endoscopic gastrostomy) SEP 2011 [facility] RADS  . Restless leg syndrome   . Hyperlipemia   . PVD (peripheral vascular disease)   . Asthma   . CHF (congestive heart failure)   . Edema   . Hemiplegia   . Insomnia   . Anemia   . MRSA infection   . TIA (transient ischemic attack)   . Foreign body in bladder and urethra   . Osteoporosis   . Depression   . Cerebral atherosclerosis     Past Surgical History  Procedure Laterality Date  . Colonoscopy  2008    SIMPLE ADENOMA, bX: NEG FOR MICROSCOPIC COLITIS  . Ventral hernia repair  2005  . Upper gastrointestinal endoscopy  2005 NUR    W/ DILATATION  . Peg placement  07/12/2011 20 FR     MAY 2-20 FR BS BALLOON PEG    Colman Cater MS,RD,CSG,LDN Office: [phone] Pager: [phone]

## 2013-09-14 NOTE — Progress Notes (Signed)
Visited patient and family present to address Advance Directives. As in two previous visits patient was sleeping. Unsure of the possibility to have this completed.

## 2013-09-14 NOTE — Clinical Social Work Note (Signed)
CSW updated [facility] on pt. They can accept pt over weekend if ready and are open to comfort care if this is decision made by family. CSW will continue to follow.  Benay Pike, [place]

## 2013-09-14 NOTE — Progress Notes (Signed)
[facility] PROGRESS NOTE  Tyshae Stair SNK:[id number] DOB: [birth year] DOA: October 11, 2013 PCP: Tula Nakayama, MD  Assessment/Plan: Acute encephalopathy: Likely related to infectious process secondary to pneumonia and urinary tract infection and or hypglycemia. However, given hx and lack of improvement with fluids and antibiotics some concern for stroke and/or seizure. CT of the head showed no acute abnormality. Remains somewhat lethargic this am. CBG range 128-179.  Tube feedings restarted 09/12/13 and changed to osmolite 1.5 yesterday. Attempts to follow commands. Vancomycin and cefepime day #4 per pharmacy. Afebrile with somewhat soft BP. Active Problems:  Healthcare-associated pneumonia: Per chest x-ray. She is a resident of [facility] with history of stroke and dysphasia. Some concern for aspiration pneumonia as well. Strep pneumo urinary antigen negative and Legionella antigen negative. Continue vancomycin and cefepime per pharmacy. VSS with a somewhat soft BP. Afebrile. Will continue oxygen support. Unable to get swallow eval secondary to #1. Family reports po intake has been very limited last several weeks.   Pulmonary edema: per chest xray.  IV fluids discontinued 09/13/13 and lasix per tube started. Urine output only fair. Oxygen saturation level 97% on 3L. Continue to monitor  Anasarca: related to severe malnutrition and third spacing of fluid. IV fluids discontinued. Lasix started 09/13/13. Will increase dose today as BP allows. Urine output only fair. Wt 110kg up from 106kg on admission. Continue daily weights.   UTI (lower urinary tract infection): Urine culture with proteus mirabilis.  Antibiotic as above.   Hypoglycemia: resolved today. Feedings started 09/12/13. Given D5W yesterday. Fluids discontinued due to anasarca. CBG range 128-179. Will start SSI for optimal glycemic control.    HYPERTENSION: BP soft today.  Review indicates history of hypertension. No antihypertensives on her  home medication list.   Leukocytosis: Related to above. Labs this am peniding. Currently she is afebrile and nontoxic appearing. Antibiotics as above. Will monitor closely.   DYSPHAGIA UNSPECIFIED: History of same since her stroke. Concern given right-sided pneumonia. Family reports very little po intake over last several weeks.   DM w/o Complication Type II: Not on any medications. Family reports patient lost weight over the last several years. Hemoglobin A1c 4.5. Serum glucose 84 on admission. See above   ANEMIA: Normocytic. Stable at baseline. No signs and symptoms of active bleeding. Likely related to chronic disease.   CHF: Last echo in 2010 yields an EF of 60-65% with mild concentric hypertrophy. remains compensated. No home medications for heart failure noted.   CVA: 2008 with right hemiparesis. Will request PT  PERIPHERAL VASCULAR DISEASE: Appears stable at baseline  Morbid obesity   GERD: Continue home PPI  Code Status: DNR Family Communication: daughter at bedside Disposition Plan: back to facility   Consultants:  none  Procedures:  none  Antibiotics: Vancomycin 10/11/2013>>  Cefepime 2013/10/11>>   HPI/Subjective: Eyes open attempts to respond verbally to questions. Attempt to follow commands. Denies pain/discomfort  Objective: Filed Vitals:   09/14/13 0655  BP: 96/63  Pulse: 109  Temp: 97.9 F (36.6 C)  Resp: 20    Intake/Output Summary (Last 24 hours) at 09/14/13 1107 Last data filed at 09/14/13 0010  Gross per 24 hour  Intake      0 ml  Output    351 ml  Net   -351 ml   Filed Weights   09/12/13 0406 09/13/13 0447 09/14/13 0655  Weight: 112.1 kg (247 lb 2.2 oz) 115.3 kg (254 lb 3.1 oz) 110 kg (242 lb 8.1 oz)    Exam:   General:  Lethargic but appears comfortable  Cardiovascular: RRR No MGR 2+ LE edema and 2+LE edema  Respiratory: normal effort but shallow. BS coarse. Weak cough effort. No wheeze  Abdomen: obese soft +BS non-tender to  palpation  Musculoskeletal: no clubbing or cyanosis   Data Reviewed: Basic Metabolic Panel:  Recent Labs Lab 09/04/2013 1141 09/12/13 0505 09/13/13 1236 09/14/13 0445  NA 139 141 143 145  K 4.0 3.6* 3.6* 3.4*  CL 105 107 110 112  CO2 29 26 23 22   GLUCOSE 84 59* 123* 161*  BUN 34* 30* 31* 32*  CREATININE 0.73 0.66 0.62 0.63  CALCIUM 7.4* 7.4* 7.2* 7.1*   Liver Function Tests:  Recent Labs Lab 09/14/13 0445  AST 17  ALT 13  ALKPHOS 193*  BILITOT 0.2*  PROT 4.9*  ALBUMIN 0.6*   No results found for this basename: LIPASE, AMYLASE,  in the last 168 hours No results found for this basename: AMMONIA,  in the last 168 hours CBC:  Recent Labs Lab 09/02/2013 1141 09/12/13 0505 09/13/13 1236  WBC 13.2* 14.4* 12.2*  NEUTROABS 6.1  --   --   HGB 9.2* 9.3* 8.9*  HCT 25.5* 26.6* 25.0*  MCV 87.6 89.3 89.0  PLT 267 255 192   Cardiac Enzymes: No results found for this basename: CKTOTAL, CKMB, CKMBINDEX, TROPONINI,  in the last 168 hours BNP (last 3 results) No results found for this basename: PROBNP,  in the last 8760 hours CBG:  Recent Labs Lab 09/13/13 1628 09/13/13 2011 09/14/13 0005 09/14/13 0414 09/14/13 0834  GLUCAP 109* 130* 128* 148* 179*    Recent Results (from the past 240 hour(s))  URINE CULTURE     Status: None   Collection Time    09/06/2013 12:17 PM      Result Value Range Status   Specimen Description URINE, CATHETERIZED   Final   Special Requests NONE   Final   Culture  Setup Time     Final   Value: 09/12/2013 00:48     Performed at [facility]     Final   Value: >=100,000 COLONIES/ML     Performed at [facility]   Culture     Final   Value: PROTEUS MIRABILIS     Performed at [facility]   Report Status PENDING   Incomplete  WOUND CULTURE     Status: None   Collection Time    09/10/2013  4:18 PM      Result Value Range Status   Specimen Description DRAINAGE PEG SITE   Final   Special Requests NONE    Final   Gram Stain     Final   Value: FEW WBC PRESENT, PREDOMINANTLY PMN     FEW SQUAMOUS EPITHELIAL CELLS PRESENT     ABUNDANT GRAM POSITIVE RODS     FEW YEAST     FEW GRAM POSITIVE COCCI IN PAIRS   Culture     Final   Value: Culture reincubated for better growth     Performed at [facility]   Report Status PENDING   Incomplete  MRSA PCR SCREENING     Status: Abnormal   Collection Time    09/10/2013  4:19 PM      Result Value Range Status   MRSA by PCR POSITIVE (*) NEGATIVE Final   Comment: RESULT CALLED TO, READ BACK BY AND VERIFIED WITH:     MAYS J. AT 1841 ON [id number] BY THOMPSON S.  The GeneXpert MRSA Assay (FDA     approved for NASAL specimens     only), is one component of a     comprehensive MRSA colonization     surveillance program. It is not     intended to diagnose MRSA     infection nor to guide or     monitor treatment for     MRSA infections.     Studies: Dg Chest Port 1 View  09/13/2013   CLINICAL DATA:  Confirm central line placement  EXAM: PORTABLE CHEST - 1 VIEW  COMPARISON:  September 14, 2013  FINDINGS: Patient is rotated. Left PICC line is identified with tip into the right atrium. It is approximately 5 cm beyond the cavoatrial junction. There is moderate interstitial change bilaterally. There is mild cardiac enlargement and there is vascular congestion. There is a right small to moderate pleural effusion.  IMPRESSION: Congestive heart failure with pulmonary edema. No pneumothorax status post central line placement.  PICC line tip in the right atrium.   Electronically Signed   By: Skipper Cliche M.D.   On: 09/13/2013 11:41    Scheduled Meds: . albuterol  2.5 mg Nebulization Q6H  . antiseptic oral rinse  15 mL Mouth Rinse BID  . ceFEPime (MAXIPIME) IV  1 g Intravenous Q8H  . Chlorhexidine Gluconate Cloth  6 each Topical Q0600  . dextrose  25 mL Intravenous Once  . enoxaparin (LOVENOX) injection  40 mg Subcutaneous Q24H  . feeding  supplement (PRO-STAT SUGAR FREE 64)  30 mL Per Tube BID  . ferrous sulfate  300 mg Per Tube Q breakfast  . free water  250 mL Per Tube Q6H  . furosemide  20 mg Per Tube BID  . hydrOXYzine  25 mg Per Tube BID  . insulin aspart  0-5 Units Subcutaneous QHS  . insulin aspart  0-9 Units Subcutaneous TID WC  . levETIRAcetam  250 mg Per Tube BID  . metoCLOPramide  5 mg Per Tube BID  . mirtazapine  7.5 mg Per Tube QHS  . mometasone-formoterol  2 puff Inhalation BID  . mupirocin ointment  1 application Nasal BID  . nystatin   Topical BID  . pantoprazole sodium  40 mg Per Tube Daily  . potassium chloride  40 mEq Per Tube Once  . sertraline  50 mg Per Tube Daily  . sodium chloride  10-40 mL Intracatheter Q12H  . triamcinolone cream  1 application Topical BID   Continuous Infusions: . feeding supplement (OSMOLITE 1.5 CAL) 1,000 mL (09/13/13 1711)    Principal Problem:   Acute encephalopathy Active Problems:   DM w/o Complication Type II   Morbid obesity   ANEMIA   HYPERTENSION   Acute on chronic diastolic CHF (congestive heart failure)   CVA   PERIPHERAL VASCULAR DISEASE   GERD   DYSPHAGIA UNSPECIFIED   Healthcare-associated pneumonia   UTI (lower urinary tract infection)   Leukocytosis   HCAP (healthcare-associated pneumonia)   Hypoglycemia   Acute respiratory failure   Anasarca   Acute pulmonary edema    Time spent: 35 minutes    [facility] Pager [phone]. If 7PM-7AM, please contact night-coverage at [email or web address], password [facility] 09/14/2013, 11:07 AM  LOS: 3 days

## 2013-09-14 NOTE — Progress Notes (Signed)
SLP Cancellation Note  Patient Details Name: [name] MRN: [id number] DOB: Apr 04, 1932   Cancelled treatment:       Reason Eval/Treat Not Completed: Other (comment) (SLP attempted BSE, but daughters both asked that I did not try any PO today. They said that their mother was not ready. I informed nursing of this. )   Cashius Grandstaff S 09/14/2013, 10:13 AM

## 2013-09-14 NOTE — Progress Notes (Signed)
[facility] for Vancomycin and Cefepime Indication: pneumonia  Allergies  Allergen Reactions  . Macrobid [Nitrofurantoin] Other (See Comments)    Unknown reactions, allergy noted on MAR  . Penicillins Itching and Rash   Patient Measurements: Height: 5' 8.5" (174 cm) Weight: 242 lb 8.1 oz (110 kg) IBW/kg (Calculated) : 65.05  Vital Signs: Temp: 97.9 F (36.6 C) (01/16 0655) Temp src: Axillary (01/16 0655) BP: 96/63 mmHg (01/16 0655) Pulse Rate: 109 (01/16 0655) Intake/Output from previous day: 01/15 0701 - 01/16 0700 In: -  Out: 351 [Urine:350; Stool:1] Intake/Output from this shift:    Labs:  Recent Labs  09/12/13 0505 09/13/13 1236 09/14/13 0445  WBC 14.4* 12.2*  --   HGB 9.3* 8.9*  --   PLT 255 192  --   CREATININE 0.66 0.62 0.63   Estimated Creatinine Clearance: 74.8 ml/min (by C-G formula based on Cr of 0.63).  Recent Labs  09/14/13 0122 09/14/13 1316  VANCOTROUGH 48.0*  --   VANCORANDOM  --  39.5     Microbiology: Recent Results (from the past 720 hour(s))  URINE CULTURE     Status: None   Collection Time    09/08/2013 12:17 PM      Result Value Range Status   Specimen Description URINE, CATHETERIZED   Final   Special Requests NONE   Final   Culture  Setup Time     Final   Value: 09/12/2013 00:48     Performed at [facility]     Final   Value: >=100,000 COLONIES/ML     Performed at [facility]   Culture     Final   Value: PROTEUS MIRABILIS     Performed at [facility]   Report Status PENDING   Incomplete  WOUND CULTURE     Status: None   Collection Time    09/07/2013  4:18 PM      Result Value Range Status   Specimen Description DRAINAGE PEG SITE   Final   Special Requests NONE   Final   Gram Stain     Final   Value: FEW WBC PRESENT, PREDOMINANTLY PMN     FEW SQUAMOUS EPITHELIAL CELLS PRESENT     ABUNDANT GRAM POSITIVE RODS     FEW YEAST     FEW GRAM POSITIVE COCCI  IN PAIRS   Culture     Final   Value: Culture reincubated for better growth     Performed at [facility]   Report Status PENDING   Incomplete  MRSA PCR SCREENING     Status: Abnormal   Collection Time    09/17/2013  4:19 PM      Result Value Range Status   MRSA by PCR POSITIVE (*) NEGATIVE Final   Comment: RESULT CALLED TO, READ BACK BY AND VERIFIED WITH:     MAYS J. AT 1841 ON [id number] BY THOMPSON S.                The GeneXpert MRSA Assay (FDA     approved for NASAL specimens     only), is one component of a     comprehensive MRSA colonization     surveillance program. It is not     intended to diagnose MRSA     infection nor to guide or     monitor treatment for     MRSA infections.   Medical History: Past Medical  History  Diagnosis Date  . GERD (gastroesophageal reflux disease)   . Dysphagia 2008    LAST BPE 2011: SEVERE EMD  . Stroke 2010    R HEMIPARESIS  . Left carotid artery occlusion   . Obesity, morbid (more than 100 lbs over ideal weight or BMI > 40)   . Diabetes mellitus   . Encounter for PEG (percutaneous endoscopic gastrostomy) SEP 2011 [facility] RADS  . Restless leg syndrome   . Hyperlipemia   . PVD (peripheral vascular disease)   . Asthma   . CHF (congestive heart failure)   . Edema   . Hemiplegia   . Insomnia   . Anemia   . MRSA infection   . TIA (transient ischemic attack)   . Foreign body in bladder and urethra   . Osteoporosis   . Depression   . Cerebral atherosclerosis    Medications:  Scheduled:  . albuterol  2.5 mg Nebulization Q6H  . antiseptic oral rinse  15 mL Mouth Rinse BID  . ceFEPime (MAXIPIME) IV  1 g Intravenous Q8H  . Chlorhexidine Gluconate Cloth  6 each Topical Q0600  . dextrose  25 mL Intravenous Once  . enoxaparin (LOVENOX) injection  40 mg Subcutaneous Q24H  . feeding supplement (PRO-STAT SUGAR FREE 64)  30 mL Per Tube BID  . ferrous sulfate  300 mg Per Tube Q breakfast  . free water  250 mL Per Tube Q6H  . furosemide   40 mg Per Tube BID  . hydrOXYzine  25 mg Per Tube BID  . insulin aspart  0-5 Units Subcutaneous QHS  . insulin aspart  0-9 Units Subcutaneous TID WC  . levETIRAcetam  250 mg Per Tube BID  . metoCLOPramide  5 mg Per Tube BID  . mirtazapine  7.5 mg Per Tube QHS  . mometasone-formoterol  2 puff Inhalation BID  . mupirocin ointment  1 application Nasal BID  . nystatin   Topical BID  . pantoprazole sodium  40 mg Per Tube Daily  . sertraline  50 mg Per Tube Daily  . sodium chloride  10-40 mL Intracatheter Q12H  . triamcinolone cream  1 application Topical BID  . [START ON 09/15/2013] vancomycin  1,500 mg Intravenous Q48H   Assessment: 80yo obese female who continues on broad-spectrum IV antibiotics for HCAP.  Urine cx + Proteus (sensitivities pending).  No respiratory cx obtained.   She is afebrile & WBC trending down.   Her renal function has been stable since admission, however she is not clearing Vancomycin as anticipated from population parameters.   Vancomycin trough elevated prior to morning down.  Repeat level 12 hours later still elevated, but trending down.  Estimate she will need q48h dosing using pharmacokinetics.    Vancomycin 1/13 >> Cefepime 1/13 >>  Goal of Therapy:  Vancomycin trough level 15-20 mcg/ml  Plan:  Continue Cefepime 1gm IV q8h Decrease Vancomycin 1.5gm IV q48h Recheck trough at steady state Monitor labs, renal fxn, and cultures Duration of therapy per MD- recommend 7 days per HCAP guidelines.  Consider d/c Vancomycin now.   Biagio Borg 09/14/2013,2:09 PM

## 2013-09-14 NOTE — Progress Notes (Signed)
Patient seen and examined. Above note reviewed.  Patient continues to have slurred speech, she does not carry on conversation. Her responses to questions are yes and now. Beyond that, her speech is coherent. She still appears to be confused. Blood sugars have improved with changing of tube feedings to Osmolite 1.5. Fluids were discontinued due to anasarca related to low oncotic pressures with hypoalbuminemia. Albumin was checked and found to be extremely low at 0.6. She still has a fair oxygen requirement appear short of breath. Lasix was started on 09/13/13. Blood pressure appears to be stable and urine output not significant. We will increase dosage of Lasix. Continue antibiotics for pneumonia and UTI for now. Prognosis remains guarded. I had a long discussion with the patient's daughter and son. They aren't appearing to have a difficult time coming to terms with her mother she prognosis. We'll continue with current treatments for now. If no significant improvements, we'll need to consider hospice. Family wishes to continue with care as is for now.  [name]

## 2013-09-14 NOTE — Progress Notes (Signed)
Notified Dyanne Carrel NP of the daughters refusal for SLP to feed the patient any food for now due to her still being some confused.  She verbalized understanding.  One of the daughters voiced the same concerns to me as well.  I also notified Santiago Glad that I was aware if the patient having some hypoglycemia, but her CBG's are now increasing over time.  Reccommended SSI.  She states that she will see the patient, and I will continue to monitor.

## 2013-09-15 ENCOUNTER — Inpatient Hospital Stay (HOSPITAL_COMMUNITY): Payer: PRIVATE HEALTH INSURANCE

## 2013-09-15 DIAGNOSIS — I5033 Acute on chronic diastolic (congestive) heart failure: Secondary | ICD-10-CM

## 2013-09-15 DIAGNOSIS — I509 Heart failure, unspecified: Secondary | ICD-10-CM

## 2013-09-15 DIAGNOSIS — D649 Anemia, unspecified: Secondary | ICD-10-CM

## 2013-09-15 LAB — GLUCOSE, CAPILLARY
GLUCOSE-CAPILLARY: 155 mg/dL — AB (ref 70–99)
GLUCOSE-CAPILLARY: 135 mg/dL — AB (ref 70–99)
GLUCOSE-CAPILLARY: 89 mg/dL (ref 70–99)
Glucose-Capillary: 143 mg/dL — ABNORMAL HIGH (ref 70–99)
Glucose-Capillary: 67 mg/dL — ABNORMAL LOW (ref 70–99)
Glucose-Capillary: 76 mg/dL (ref 70–99)

## 2013-09-15 LAB — BASIC METABOLIC PANEL
BUN: 35 mg/dL — ABNORMAL HIGH (ref 6–23)
CHLORIDE: 114 meq/L — AB (ref 96–112)
CO2: 25 mEq/L (ref 19–32)
Calcium: 7.4 mg/dL — ABNORMAL LOW (ref 8.4–10.5)
Creatinine, Ser: 0.65 mg/dL (ref 0.50–1.10)
GFR calc Af Amer: >90 mL/min (ref >90)
GFR calc non Af Amer: 82 mL/min — ABNORMAL LOW (ref >90)
Glucose, Bld: 152 mg/dL — ABNORMAL HIGH (ref 70–99)
Potassium: 3.7 mEq/L (ref 3.7–5.3)
SODIUM: 146 meq/L (ref 137–147)

## 2013-09-15 LAB — CBC
HCT: 22.2 % — ABNORMAL LOW (ref 36.0–46.0)
Hemoglobin: 7.8 g/dL — ABNORMAL LOW (ref 12.0–15.0)
MCH: 31.1 pg (ref 26.0–34.0)
MCHC: 35.1 g/dL (ref 30.0–36.0)
MCV: 88.4 fL (ref 78.0–100.0)
PLATELETS: 131 10*3/uL — AB (ref 150–400)
RBC: 2.51 MIL/uL — ABNORMAL LOW (ref 3.87–5.11)
RDW: 18.3 % — ABNORMAL HIGH (ref 11.5–15.5)
WBC: 19.7 10*3/uL — AB (ref 4.0–10.5)

## 2013-09-15 LAB — URINE CULTURE

## 2013-09-15 LAB — AMMONIA: AMMONIA: 89 umol/L — AB (ref 11–60)

## 2013-09-15 MED ORDER — FUROSEMIDE 10 MG/ML IJ SOLN
20.0000 mg | Freq: Two times a day (BID) | INTRAMUSCULAR | Status: DC
  Administered 2013-09-15 – 2013-09-16 (×2): 20 mg via INTRAVENOUS
  Filled 2013-09-15 (×3): qty 2

## 2013-09-15 MED ORDER — LACTULOSE 10 GM/15ML PO SOLN
20.0000 g | Freq: Three times a day (TID) | ORAL | Status: DC
  Administered 2013-09-15 – 2013-09-17 (×4): 20 g via ORAL
  Filled 2013-09-15 (×7): qty 30

## 2013-09-15 MED ORDER — FREE WATER
350.0000 mL | Status: DC
  Administered 2013-09-15 – 2013-09-17 (×11): 350 mL

## 2013-09-15 MED ORDER — METRONIDAZOLE IN NACL 5-0.79 MG/ML-% IV SOLN
500.0000 mg | Freq: Three times a day (TID) | INTRAVENOUS | Status: DC
  Administered 2013-09-15 – 2013-09-17 (×6): 500 mg via INTRAVENOUS
  Filled 2013-09-15 (×11): qty 100

## 2013-09-15 MED ORDER — OSMOLITE 1.5 CAL PO LIQD
1000.0000 mL | ORAL | Status: DC
  Administered 2013-09-15 – 2013-09-16 (×2): 1000 mL
  Filled 2013-09-15 (×5): qty 1000

## 2013-09-15 NOTE — Progress Notes (Signed)
Paged Dr. Darrick Meigs of the patients amonia level 89 and the CXR worsening results.  Notified oncoming nurse to await his call.

## 2013-09-15 NOTE — Progress Notes (Signed)
[facility] PROGRESS NOTE  Junko Ohagan YQI:[id number] DOB: [birth year] DOA: September 27, 2013 PCP: Tula Nakayama, MD  Assessment/Plan: Acute encephalopathy: Likely related to infectious process secondary to pneumonia and urinary tract infection and or hypoglycemia. CT of the head showed no acute abnormality. Remains somewhat lethargic this am. CBG improved.  Tube feedings restarted 09/12/13 and changed to osmolite 1.5.  Vancomycin and cefepime day #5 per pharmacy. Afebrile with somewhat soft BP.  Healthcare-associated pneumonia: Per chest x-ray. She is a resident of [facility] with history of stroke and dysphasia. Some concern for aspiration pneumonia as well. Strep pneumo urinary antigen negative and Legionella antigen negative. Continue vancomycin and cefepime per pharmacy. VSS with a somewhat soft BP. Afebrile. Will continue oxygen support. Unable to get swallow eval secondary to #1. Family reports po intake has been very limited last several weeks.   Pulmonary edema: per chest xray. She is on lv lasix as her blood pressure will tolerate. Likely related to low oncotic pressures and hypoalbuminemia. Continue to monitor  Anasarca: related to severe malnutrition and third spacing of fluid. IV fluids discontinued. Lasix started 09/13/13. Will increase dose today as BP allows. Urine output only fair. Continue daily weights.   UTI (lower urinary tract infection): Urine culture with proteus mirabilis.  Antibiotic as above.   Hypoglycemia: resolved. Feedings started 09/12/13. Fluids discontinued due to anasarca and pulmonary edema. No further hypoglycemia. Will start SSI for optimal glycemic control.    HYPERTENSION: BP soft today.  Review indicates history of hypertension. No antihypertensives on her home medication list.   Leukocytosis: Possibly related to underlying infection. With antibiotic use, there is concern for developing C diff.  Will check C diff pcr.  Repeat chest xray.  Urine culture shows  proteus which is adequately treated. Will check blood cultures.   DYSPHAGIA UNSPECIFIED: History of same since her stroke. Concern given right-sided pneumonia. Family reports very little po intake over last several weeks.   DM w/o Complication Type II: Not on any medications. Family reports patient lost weight over the last several years. Hemoglobin A1c 4.5. Serum glucose 84 on admission. See above   ANEMIA: Normocytic. Likely related to critical illness. Will transfuse for hemoglobin less than 7. No evidence of bleeding.  Will check stools for occult blood.   CHF: Last echo in 2010 yields an EF of 60-65% with mild concentric hypertrophy. remains compensated. No home medications for heart failure noted.   CVA: 2008 with right hemiparesis. Will request PT  PERIPHERAL VASCULAR DISEASE: Appears stable at baseline  Morbid obesity   GERD: Continue home PPI   Code Status: DNR Family Communication: discussed with son and daughters as well as other family members Disposition Plan: back to facility   Consultants:  none  Procedures:  none  Antibiotics: Vancomycin 09/27/13>>  Cefepime 2013/09/27>>   HPI/Subjective: Sitting up in bed.  Denies any shortness of breath. Denies any pain, answers with only yes and no  Objective: Filed Vitals:   09/15/13 1513  BP: 96/55  Pulse: 95  Temp: 97.1 F (36.2 C)  Resp: 20    Intake/Output Summary (Last 24 hours) at 09/15/13 1607 Last data filed at 09/15/13 1200  Gross per 24 hour  Intake   1460 ml  Output    800 ml  Net    660 ml   Filed Weights   09/13/13 0447 09/14/13 0655 09/15/13 0500  Weight: 115.3 kg (254 lb 3.1 oz) 110 kg (242 lb 8.1 oz) 116 kg (255 lb 11.7 oz)  Exam:   General:  Lethargic but appears comfortable  Cardiovascular: RRR No MGR 2+ UE edema and 2-3+LE edema  Respiratory: normal effort but shallow. BS coarse. Weak cough effort. No wheeze  Abdomen: obese soft +BS non-tender to palpation  Musculoskeletal: no  clubbing or cyanosis   Data Reviewed: Basic Metabolic Panel:  Recent Labs Lab Oct 01, 2013 1141 09/12/13 0505 09/13/13 1236 09/14/13 0445 09/15/13 0627  NA 139 141 143 145 146  K 4.0 3.6* 3.6* 3.4* 3.7  CL 105 107 110 112 114*  CO2 29 26 23 22 25   GLUCOSE 84 59* 123* 161* 152*  BUN 34* 30* 31* 32* 35*  CREATININE 0.73 0.66 0.62 0.63 0.65  CALCIUM 7.4* 7.4* 7.2* 7.1* 7.4*   Liver Function Tests:  Recent Labs Lab 09/14/13 0445  AST 17  ALT 13  ALKPHOS 193*  BILITOT 0.2*  PROT 4.9*  ALBUMIN 0.6*   No results found for this basename: LIPASE, AMYLASE,  in the last 168 hours No results found for this basename: AMMONIA,  in the last 168 hours CBC:  Recent Labs Lab 10-01-2013 1141 09/12/13 0505 09/13/13 1236 09/15/13 0627  WBC 13.2* 14.4* 12.2* 19.7*  NEUTROABS 6.1  --   --   --   HGB 9.2* 9.3* 8.9* 7.8*  HCT 25.5* 26.6* 25.0* 22.2*  MCV 87.6 89.3 89.0 88.4  PLT 267 255 192 131*   Cardiac Enzymes: No results found for this basename: CKTOTAL, CKMB, CKMBINDEX, TROPONINI,  in the last 168 hours BNP (last 3 results) No results found for this basename: PROBNP,  in the last 8760 hours CBG:  Recent Labs Lab 09/14/13 1135 09/14/13 1713 09/14/13 2046 09/15/13 0719 09/15/13 1149  GLUCAP 153* 125* 112* 143* 155*    Recent Results (from the past 240 hour(s))  URINE CULTURE     Status: None   Collection Time    10-01-2013 12:17 PM      Result Value Range Status   Specimen Description URINE, CATHETERIZED   Final   Special Requests NONE   Final   Culture  Setup Time     Final   Value: 09/12/2013 00:48     Performed at [facility] Count     Final   Value: >=100,000 COLONIES/ML     Performed at [facility]   Culture     Final   Value: PROTEUS MIRABILIS     Performed at [facility]   Report Status 09/15/2013 FINAL   Final   Organism ID, Bacteria PROTEUS MIRABILIS   Final  WOUND CULTURE     Status: None   Collection Time     2013/10/01  4:18 PM      Result Value Range Status   Specimen Description DRAINAGE PEG SITE   Final   Special Requests NONE   Final   Gram Stain     Final   Value: FEW WBC PRESENT, PREDOMINANTLY PMN     FEW SQUAMOUS EPITHELIAL CELLS PRESENT     ABUNDANT GRAM POSITIVE RODS     FEW YEAST     FEW GRAM POSITIVE COCCI IN PAIRS   Culture     Final   Value: MODERATE STAPHYLOCOCCUS AUREUS     Note: RIFAMPIN AND GENTAMICIN SHOULD NOT BE USED AS SINGLE DRUGS FOR TREATMENT OF STAPH INFECTIONS.     Performed at [facility]   Report Status PENDING   Incomplete  MRSA PCR SCREENING     Status: Abnormal  Collection Time    08/31/2013  4:19 PM      Result Value Range Status   MRSA by PCR POSITIVE (*) NEGATIVE Final   Comment: RESULT CALLED TO, READ BACK BY AND VERIFIED WITH:     MAYS J. AT 1841 ON QP:[id number] BY THOMPSON S.                The GeneXpert MRSA Assay (FDA     approved for NASAL specimens     only), is one component of a     comprehensive MRSA colonization     surveillance program. It is not     intended to diagnose MRSA     infection nor to guide or     monitor treatment for     MRSA infections.     Studies: No results found.  Scheduled Meds: . albuterol  2.5 mg Nebulization Q6H  . antiseptic oral rinse  15 mL Mouth Rinse BID  . ceFEPime (MAXIPIME) IV  1 g Intravenous Q8H  . Chlorhexidine Gluconate Cloth  6 each Topical Q0600  . dextrose  25 mL Intravenous Once  . enoxaparin (LOVENOX) injection  40 mg Subcutaneous Q24H  . feeding supplement (PRO-STAT SUGAR FREE 64)  30 mL Per Tube BID  . ferrous sulfate  300 mg Per Tube Q breakfast  . free water  350 mL Per Tube Q4H  . furosemide  20 mg Intravenous Q12H  . hydrOXYzine  25 mg Per Tube BID  . insulin aspart  0-5 Units Subcutaneous QHS  . insulin aspart  0-9 Units Subcutaneous TID WC  . levETIRAcetam  250 mg Per Tube BID  . metoCLOPramide  5 mg Per Tube BID  . metronidazole  500 mg Intravenous Q8H  . mirtazapine  7.5  mg Per Tube QHS  . mometasone-formoterol  2 puff Inhalation BID  . mupirocin ointment  1 application Nasal BID  . nystatin   Topical BID  . pantoprazole sodium  40 mg Per Tube Daily  . sertraline  50 mg Per Tube Daily  . sodium chloride  10-40 mL Intracatheter Q12H  . triamcinolone cream  1 application Topical BID  . vancomycin  1,500 mg Intravenous Q48H   Continuous Infusions: . feeding supplement (OSMOLITE 1.5 CAL)      Principal Problem:   Acute encephalopathy Active Problems:   DM w/o Complication Type II   Morbid obesity   ANEMIA   HYPERTENSION   Acute on chronic diastolic CHF (congestive heart failure)   CVA   PERIPHERAL VASCULAR DISEASE   GERD   DYSPHAGIA UNSPECIFIED   Healthcare-associated pneumonia   UTI (lower urinary tract infection)   Leukocytosis   HCAP (healthcare-associated pneumonia)   Hypoglycemia   Acute respiratory failure   Anasarca   Acute pulmonary edema   Protein-calorie malnutrition, severe    Time spent: 35 minutes    [facility] Pager [phone]. If 7PM-7AM, please contact night-coverage at [email or web address], password [facility] 09/15/2013, 4:07 PM  LOS: 4 days

## 2013-09-15 NOTE — Progress Notes (Signed)
Notified Dr. Roderic Palau of not being able to drawn blood peripheral site.  We did draw from the port on the PICC.  MD stated to do the best we can.  Two lab techs attempted to draw the blood from the peripheral site and was unable.  The lab tech stated she would cancel the peripheral draw order.

## 2013-09-15 NOTE — Plan of Care (Signed)
Problem: Phase II Progression Outcomes Goal: Encourage coughing & deep breathing Outcome: Not Progressing Unable to follow command

## 2013-09-16 DIAGNOSIS — I959 Hypotension, unspecified: Secondary | ICD-10-CM

## 2013-09-16 LAB — GLUCOSE, CAPILLARY
GLUCOSE-CAPILLARY: 110 mg/dL — AB (ref 70–99)
GLUCOSE-CAPILLARY: 69 mg/dL — AB (ref 70–99)
GLUCOSE-CAPILLARY: 135 mg/dL — AB (ref 70–99)
Glucose-Capillary: 76 mg/dL (ref 70–99)
Glucose-Capillary: 118 mg/dL — ABNORMAL HIGH (ref 70–99)

## 2013-09-16 LAB — CBC
HEMATOCRIT: 21 % — AB (ref 36.0–46.0)
Hemoglobin: 7.4 g/dL — ABNORMAL LOW (ref 12.0–15.0)
MCH: 31.4 pg (ref 26.0–34.0)
MCHC: 35.2 g/dL (ref 30.0–36.0)
MCV: 89 fL (ref 78.0–100.0)
PLATELETS: 107 10*3/uL — AB (ref 150–400)
RBC: 2.36 MIL/uL — ABNORMAL LOW (ref 3.87–5.11)
RDW: 18.4 % — AB (ref 11.5–15.5)
WBC: 17.5 10*3/uL — AB (ref 4.0–10.5)

## 2013-09-16 LAB — WOUND CULTURE

## 2013-09-16 LAB — BASIC METABOLIC PANEL
BUN: 40 mg/dL — ABNORMAL HIGH (ref 6–23)
CHLORIDE: 111 meq/L (ref 96–112)
CO2: 24 meq/L (ref 19–32)
Calcium: 7.2 mg/dL — ABNORMAL LOW (ref 8.4–10.5)
Creatinine, Ser: 0.6 mg/dL (ref 0.50–1.10)
GFR calc non Af Amer: 84 mL/min — ABNORMAL LOW (ref >90)
Glucose, Bld: 131 mg/dL — ABNORMAL HIGH (ref 70–99)
POTASSIUM: 3.4 meq/L — AB (ref 3.7–5.3)
Sodium: 142 mEq/L (ref 137–147)

## 2013-09-16 MED ORDER — SODIUM CHLORIDE 0.9 % IV BOLUS (SEPSIS)
250.0000 mL | Freq: Once | INTRAVENOUS | Status: AC
  Administered 2013-09-16: 250 mL via INTRAVENOUS

## 2013-09-16 MED ORDER — METRONIDAZOLE IN NACL 5-0.79 MG/ML-% IV SOLN
INTRAVENOUS | Status: AC
  Filled 2013-09-16: qty 100

## 2013-09-16 MED ORDER — ALBUMIN HUMAN 25 % IV SOLN
25.0000 g | Freq: Two times a day (BID) | INTRAVENOUS | Status: DC
  Administered 2013-09-17 (×2): 25 g via INTRAVENOUS
  Filled 2013-09-16 (×4): qty 100

## 2013-09-16 MED ORDER — NOREPINEPHRINE BITARTRATE 1 MG/ML IJ SOLN
2.0000 ug/min | INTRAVENOUS | Status: DC
  Administered 2013-09-17: 18 ug/min via INTRAVENOUS
  Administered 2013-09-17: 2 ug/min via INTRAVENOUS
  Filled 2013-09-16 (×2): qty 4

## 2013-09-16 MED ORDER — SODIUM CHLORIDE 0.9 % IV BOLUS (SEPSIS)
500.0000 mL | Freq: Once | INTRAVENOUS | Status: AC
  Administered 2013-09-16: 500 mL via INTRAVENOUS

## 2013-09-16 NOTE — Progress Notes (Signed)
LAte entry:  Dr. Roderic Palau was notified of the patients positive MRSA result taken 09/28/2013 from a cultured wound of her peg site.  He stated that the patient was already covered with vancomycin.

## 2013-09-16 NOTE — Consult Note (Signed)
Name: [name] MRN: [id number] DOB: 1932-01-24    ADMISSION DATE:  09/17/2013 CONSULTATION DATE:  09/16/13  REFERRING MD :  Debbe Odea, MD PRIMARY SERVICE: PCCM  CHIEF COMPLAINT:  Hypotension  BRIEF PATIENT DESCRIPTION:  80 years old female with PMH relevant for DM, CHF, PVD, obesity, stroke with right hemiparesis, dysphagia post PEG tube placement, severe malnutrition, anasarca, MRSA. Transferred from [facility] to [facility] under [facility] for [place] and [facility]. In the stepdown with persistent hypotension in the setting of diuresis for her anasarca. PCCM consult called to assist with management.   SIGNIFICANT EVENTS / STUDIES:  Chest X ray: IMPRESSION:  Increased density in the right upper lobe has developed since the  previous study. This is superimposed upon a background of increased  interstitial lung markings bilaterally. The findings are worrisome  for progressive right upper lobe pneumonia. Increased interstitial  markings elsewhere in both lungs may reflect interstitial edema or  interstitial pneumonia. When the patient can tolerate the procedure,  a PA and lateral chest x-ray would be of value   LINES / TUBES: - PICC line - Foley catheter - PEG tube  CULTURES: - Blood culture from 09/15/13 negative to date - Wound culture (PEG site): MRSA  ANTIBIOTICS: - Cefepime - Vancomycin  HISTORY OF PRESENT ILLNESS:   80 years old female with PMH relevant for DM, CHF, PVD, obesity, stroke with right hemiparesis, dysphagia post PEG tube placement, severe malnutrition, anasarca, MRSA. Transferred from [facility] to [facility] under [facility] for [place] and [facility]. In the stepdown with persistent hypotension in the setting of diuresis for her anasarca. PCCM consult called to assist with management. At the time of my examination the patient is lethargic but arousable. Oriented in person and place. BP is 70/30, HR in the 90's, saturating 100% on 2 L [place].   PAST MEDICAL HISTORY :    Past Medical History  Diagnosis Date  . GERD (gastroesophageal reflux disease)   . Dysphagia 2008    LAST BPE 2011: SEVERE EMD  . Stroke 2010    R HEMIPARESIS  . Left carotid artery occlusion   . Obesity, morbid (more than 100 lbs over ideal weight or BMI > 40)   . Diabetes mellitus   . Encounter for PEG (percutaneous endoscopic gastrostomy) SEP 2011 [facility]  . Restless leg syndrome   . Hyperlipemia   . PVD (peripheral vascular disease)   . Asthma   . CHF (congestive heart failure)   . Edema   . Hemiplegia   . Insomnia   . Anemia   . MRSA infection   . TIA (transient ischemic attack)   . Foreign body in bladder and urethra   . Osteoporosis   . Depression   . Cerebral atherosclerosis    Past Surgical History  Procedure Laterality Date  . Colonoscopy  2008    SIMPLE ADENOMA, bX: NEG FOR MICROSCOPIC COLITIS  . Ventral hernia repair  2005  . Upper gastrointestinal endoscopy  2005 NUR    W/ DILATATION  . Peg placement  07/12/2011 20 FR     MAY 2-20 FR BS BALLOON PEG   Prior to Admission medications   Medication Sig Start Date End Date Taking? Authorizing Provider  Amino Acids-Protein Hydrolys (PRO-STAT 101) LIQD Take 1 Package by mouth 3 (three) times daily.   Yes Historical Provider, MD  ascorbic acid (VITAMIN C) 500 MG/5ML syrup Place 500 mg into feeding tube 2 (two) times daily.   Yes Historical  Provider, MD  Calcium Carbonate (CALCIUM 500 PO) Take 1 tablet by mouth 2 (two) times daily.    Yes Historical Provider, MD  Cranberry 475 MG CAPS Take 475 capsules by mouth daily.   Yes Historical Provider, MD  ferrous sulfate 220 (44 FE) MG/5ML solution Place 220 mg into feeding tube daily.    Yes Historical Provider, MD  Fluticasone-Salmeterol (ADVAIR DISKUS) 250-50 MCG/DOSE AEPB Inhale 1 puff into the lungs every 12 (twelve) hours.     Yes Historical Provider, MD  guaiFENesin (MUCINEX) 600 MG 12 hr tablet Take 600 mg by mouth 2 (two) times daily.    Yes Historical Provider,  MD  hydrOXYzine (ATARAX/VISTARIL) 25 MG tablet Take 25 mg by mouth 2 (two) times daily.    Yes Historical Provider, MD  ipratropium-albuterol (DUONEB) 0.5-2.5 (3) MG/3ML SOLN Take 3 mLs by nebulization 2 (two) times daily.   Yes Historical Provider, MD  Jevity 1.5 Cal (JEVITY 1.5) LIQD Place 1,000 mLs into feeding tube 2 (two) times daily.    Yes Historical Provider, MD  levETIRAcetam (KEPPRA) 100 MG/ML solution Take 250 mg by mouth 2 (two) times daily. Take 2.5 mls orally twice a day.   Yes Historical Provider, MD  metoCLOPramide (REGLAN) 5 MG tablet Take 5 mg by mouth 2 (two) times daily.    Yes Historical Provider, MD  mirtazapine (REMERON) 7.5 MG tablet Take 7.5 mg by mouth at bedtime.   Yes Historical Provider, MD  Multiple Vitamins-Iron (MULTIVITAMINS WITH IRON) TABS Take 1 tablet by mouth daily.   Yes Historical Provider, MD  omeprazole (PRILOSEC) 20 MG capsule Take 20 mg by mouth daily.    Yes Historical Provider, MD  sertraline (ZOLOFT) 50 MG tablet Take 50 mg by mouth daily.     Yes Historical Provider, MD  zinc oxide 20 % ointment Apply 1 application topically every evening. Apply to inner thighs every shift for friction   Yes Historical Provider, MD  acetaminophen (TYLENOL) 325 MG tablet Take 650 mg by mouth every 4 (four) hours as needed. For pain    Historical Provider, MD  clobetasol cream (TEMOVATE) 8.84 % Apply 1 application topically as needed. For rash. Do not apply to face, groin, or under arms    Historical Provider, MD  clotrimazole-betamethasone (LOTRISONE) cream Apply 1 application topically 2 (two) times daily. x4 weeks for rash. Started 12/18/11    Historical Provider, MD  Dextromethorphan-Guaifenesin (ROBAFEN DM) 10-100 MG/5ML liquid Take 10 mLs by mouth every 6 (six) hours as needed. cough    Historical Provider, MD  fluticasone (CUTIVATE) 0.05 % cream Apply 1 application topically 2 (two) times daily as needed. For rash    Historical Provider, MD  levofloxacin (LEVAQUIN)  750 MG tablet Take 750 mg by mouth daily. 09/10/13   Historical Provider, MD  loperamide (IMODIUM A-D) 2 MG tablet Take 4 mg by mouth as needed. diarrhea    Historical Provider, MD  nystatin (MYCOSTATIN) powder Apply topically as needed. Apply to abdominal folds topically as needed for redness and under breasts, bend of legs and groin    Historical Provider, MD  ondansetron (ZOFRAN) 8 MG tablet Take 8 mg by mouth as needed. Nausea,vomiting    Historical Provider, MD  Ondansetron HCl (ZOFRAN) 2 MG/ML SOLN injection Inject 4 mg into the vein every 6 (six) hours as needed. For nausea and vomiting    Historical Provider, MD  phenol (CHLORASEPTIC) 1.4 % LIQD Use as directed 2 sprays in the mouth or throat every 4 (  four) hours as needed. For pain    Historical Provider, MD  polyethylene glycol (MIRALAX / GLYCOLAX) packet Take 17 g by mouth daily as needed. For constipation    Historical Provider, MD  Pramoxine HCl-Cleanser (SARNA SENSITIVE) 1 % KIT Apply 1 application topically every 8 (eight) hours as needed. For itching    Historical Provider, MD  traMADol (ULTRAM) 50 MG tablet Take 50 mg by mouth every 6 (six) hours as needed. For pain    Historical Provider, MD  triamcinolone cream (KENALOG) 0.1 % Apply 1 application topically 3 (three) times daily as needed. For rash    Historical Provider, MD   Allergies  Allergen Reactions  . Macrobid [Nitrofurantoin] Other (See Comments)    Unknown reactions, allergy noted on MAR  . Penicillins Itching and Rash    FAMILY HISTORY:  History reviewed. No pertinent family history. SOCIAL HISTORY:  reports that she has quit smoking. She has never used smokeless tobacco. She reports that she does not drink alcohol or use illicit drugs.  REVIEW OF SYSTEMS:  Unable to provide  SUBJECTIVE:   VITAL SIGNS: Temp:  [97.2 F (36.2 C)-98 F (36.7 C)] 97.2 F (36.2 C) (01/18 2005) Pulse Rate:  [85-103] 96 (01/18 2005) Resp:  [20-29] 29 (01/18 2005) BP:  (91-97)/(44-66) 91/44 mmHg (01/18 2005) SpO2:  [100 %] 100 % (01/18 2005) Weight:  [259 lb 4.2 oz (117.6 kg)] 259 lb 4.2 oz (117.6 kg) (01/18 0319) HEMODYNAMICS:   VENTILATOR SETTINGS:   INTAKE / OUTPUT: Intake/Output     01/18 0701 - 01/19 0700   I.V. (mL/kg)    NG/GT 350   IV Piggyback 800   Total Intake(mL/kg) 1150 (9.8)   Urine (mL/kg/hr)    Total Output     Net +1150         PHYSICAL EXAMINATION: General: Female patient in no acute distress. Ill appearance.  Eyes: Anicteric sclerae. ENT: Oropharynx clear. Dry mucous membranes. No thrush Lymph: No cervical, supraclavicular, or axillary lymphadenopathy. Heart: Normal S1, S2. No murmurs, rubs, or gallops appreciated. No bruits, equal pulses. Lungs: Normal excursion, no dullness to percussion. Bilateral scattered crackles.  Abdomen: Abdomen soft, non-tender. PEG tube in place.  Musculoskeletal: Severe anasarca Skin: No rashes or lesions Neuro: Right hemiparesis.   LABS:  CBC  Recent Labs Lab 09/13/13 1236 09/15/13 0627 09/16/13 0553  WBC 12.2* 19.7* 17.5*  HGB 8.9* 7.8* 7.4*  HCT 25.0* 22.2* 21.0*  PLT 192 131* 107*   Coag's No results found for this basename: APTT, INR,  in the last 168 hours BMET  Recent Labs Lab 09/14/13 0445 09/15/13 0627 09/16/13 0553  NA 145 146 142  K 3.4* 3.7 3.4*  CL 112 114* 111  CO2 22 25 24   BUN 32* 35* 40*  CREATININE 0.63 0.65 0.60  GLUCOSE 161* 152* 131*   Electrolytes  Recent Labs Lab 09/14/13 0445 09/15/13 0627 09/16/13 0553  CALCIUM 7.1* 7.4* 7.2*   Sepsis Markers No results found for this basename: LATICACIDVEN, PROCALCITON, O2SATVEN,  in the last 168 hours ABG No results found for this basename: PHART, PCO2ART, PO2ART,  in the last 168 hours Liver Enzymes  Recent Labs Lab 09/14/13 0445  AST 17  ALT 13  ALKPHOS 193*  BILITOT 0.2*  ALBUMIN 0.6*   Cardiac Enzymes No results found for this basename: TROPONINI, PROBNP,  in the last 168  hours Glucose  Recent Labs Lab 09/15/13 2219 09/16/13 0750 09/16/13 1139 09/16/13 1408 09/16/13 1537 09/16/13 2002  GLUCAP  67* 110* 76 69* 135* 118*    Imaging Dg Chest Port 1 View  09/15/2013   CLINICAL DATA:  Dyspnea  EXAM: PORTABLE CHEST - 1 VIEW  COMPARISON:  Portable chest x-ray of September 13, 2013 and of 11 September 2013.  FINDINGS: Increased density in the right upper lobe has progressed since the 13 and is new since the 13th. The interstitial markings of both lungs remain increased. The cardiac silhouette is top-normal in size. There is a PICC line in place via the left upper extremity with the tip in the region of the midportion of the SVC. There is no evidence of a pleural effusion.  IMPRESSION: Increased density in the right upper lobe has developed since the previous study. This is superimposed upon a background of increased interstitial lung markings bilaterally. The findings are worrisome for progressive right upper lobe pneumonia. Increased interstitial markings elsewhere in both lungs may reflect interstitial edema or interstitial pneumonia. When the patient can tolerate the procedure, a PA and lateral chest x-ray would be of value.   Electronically Signed   By: David  Martinique   On: 09/15/2013 16:34      ASSESSMENT / PLAN:  PULMONARY A: 1) HCAP P:   - Cefepime - Vancomycin  CARDIOVASCULAR A:  1) Hypotension likely secondary to intravascular volume depletion, anasarca, severe malnutrition and possibly sepsis. P:  - Will give one 500 cc bolus of NS - Will start norepinephrine infusion via PICC line - Will start albumin BID  RENAL A:   1) Normal creatinine, likely not a reliable marker of kidney function in the setting of severe malnutrition.  P:   - Will follow BMP  GASTROINTESTINAL A:   1) Severe malnutrition 2) GERD P:   - Continue tube feedings - Protonix  HEMATOLOGIC A:   1) Anemia likely secondary to chronic disease P:  - Transfuse PRBC's for  Hgb < 7  INFECTIOUS A:   1) HCAP 2) MRSA wound infection P:   - Cefepime  - Vancomycin - Follow blood cultures  ENDOCRINE A:   1) DM 2) Consider adrenal insufficiency P:   - Continue novolog sliding scale - Morning cortisol - Consider stress dose steroids  NEUROLOGIC A:   1) Altered mental status likely secondary to acute illness  I had an extensive conversation with the patient's family and explained her very poor prognosis despite aggressive interventions. The family would like to continue aggressive measures and confirmed her full code status.    I have personally obtained a history, examined the patient, evaluated laboratory and imaging results, formulated the assessment and plan and placed orders. CRITICAL CARE: Critical Care Time devoted to patient care services described in this note is 90 minutes.   Waynetta Pean, MD Pulmonary and [place] Pager: [phone]  09/16/2013, 10:04 PM

## 2013-09-16 NOTE — Progress Notes (Signed)
1330 Report given to [facility] with the [facility] prior to their arrival to pick up the patient.    [place] another transporter with [facility] caring for the patient that the patients CBG had been declining due to the patients tube feeding being disconnected from the peg in the bed causing the feeding to be spilled rather than received.  The last know CBG was approx. 88 and so Brooke requested a f/u CBG which was 66.  Notified Dr. Roderic Palau that the patients TF had become disconnected and the CBG was declining.  I requested D10 IV since according to [facility] TF can not be administered due to the risk for aspiration.  MD stated that this was okay to give until the patients TF can be restarted since the the plan of care is to diuresis the patient.  Then the fluids she be discontinued.  I voiced this to [facility] and she verbalized understanding. The patient's family was notified and updated continuously what was going on with the patient.  Report was called to [place] at [facility] at appr. 1510 she verbalized understanding.  I voiced to her to call me with any questions until 7pm before I leave.

## 2013-09-16 NOTE — Progress Notes (Signed)
[facility] PROGRESS NOTE  Denim Start UYQ:[id number] DOB: 08/25/1932 DOA: 09/16/2013 PCP: Tula Nakayama, MD  Brief summary:  This is a 80 y/o female who has resided at a nursing home since 2008 when she suffered a stroke that left her with residual hemiparesis and dysphagia. She is peg tube dependent for tube feeds, but family reported that she did try to eat food by mouth as well.  For the past month, her po intake has been minimal, taking only a few bites of a sandwich in the entire day.  She was admitted to [facility] on 09/10/2013 for worsening mental status and being unresponsive.  Work up in the hospital revealed that she had a right upper lobe pneumonia, proteus mirabilis urinary tract infection, infected PEG tube site with MRSA, hypoglycemia. She was initially started on broad spectrum antibiotics (vancomycin and cefepime) as well as IV fluids for hypotension.  Unfortunately, due to her severe malnutrition (albumin 0.6), she has developed significant anasarca and pulmonary edema. IV fluids have since been stopped and she has been started on IV lasix as her borderline blood pressure will allow. She has also developed a significant leukocytosis and she is also having loose stools.  It is unclear whether these loose stools are related to tube feedings or if she has developed a C diff infection.  C diff pcr has been ordered and she has empirically been started on flagyl. Blood cultures have also been sent. Regarding her mental status, she was developing severe hypoglycemia when tube feeds were temporarily turned off.  She is not on any insulin or hypoglycemic agents.  Hypoglycemia has resolved with resumption of high calorie tube feeds.  Her ammonia level was also found to be high.  She does not have any known history of cirrhosis.  She has been started on lactulose.  With the patients multiple comorbidities, poor nutritional/functional status, her longterm prognosis is very poor.   Initially, this was discussed with the family and they had agreed to DNR status with the plans to transition towards comfort care if things did not improve. More recently, the family has rescinded the DNR, and wants everything done for the patient.  They are requesting that due to the patient's medical complexity, that she be transferred to a larger center for a multidisciplinary approach. I have explained the patient's poor prognosis, but they wish to continue with aggressive care. I have discussed the case with Dr. Wynelle Cleveland at [facility] who has accepted the patient in transfer. Would strongly consider a palliative care consult if the patient does not improve.  Assessment/Plan: Acute encephalopathy: Likely multifactorial. CT of the head showed no acute abnormality. Contributing factors included hypoglycemia, infectious process (pneumonia/uti), elevated ammonia. CBG improved.  Tube feedings restarted 09/12/13 and changed to osmolite 1.5.  Vancomycin and cefepime day #6 per pharmacy. Afebrile with somewhat soft BP. She is awake, but does not effectively answer questions or follow commands.  Healthcare-associated pneumonia/Aspiration pneumonia: She is a resident of [facility] with history of stroke and dysphagia. Strep pneumo urinary antigen negative and Legionella antigen negative. Continue vancomycin and cefepime per pharmacy. VSS with a somewhat soft BP. Afebrile. Will continue oxygen support. Patient has been seen by speech therapy and is currently npo. Family reports po intake has been very limited last several weeks.   Pulmonary edema: She is on lv lasix as her blood pressure will tolerate. Likely related to low oncotic pressures and hypoalbuminemia. Continue to monitor  Acute respiratory failure, related to pneumonia and  pulmonary edema.  Wean off oxygen as tolerated.  Hyperammonemia.  Ammonia found to be elevated at 89. No known history of liver disease.  Started on lactulose. Will repeat Ammonia  tomorrow.  Anasarca: related to severe malnutrition and third spacing of fluid. Serum albumin noted to be severely reduced at 0.6. IV fluids discontinued. She is on IV lasix. Dosage may be adjusted as blood pressure/renal function allows. Urine output only fair. Continue daily weights.   UTI (lower urinary tract infection): Urine culture with proteus mirabilis.  Antibiotic as above.   Hypoglycemia: resolved. Tube feedings restarted 09/12/13. IV Fluids discontinued due to anasarca and pulmonary edema. No further hypoglycemia. Will start SSI for optimal glycemic control.   Leukocytosis: Possibly related to underlying infection. With antibiotic use, there is concern for developing C diff.  Will check C diff pcr and empirically start flagyl.  Repeat chest xray indicates persistent dense right upper lobe consolidation.  Urine culture shows proteus which is adequately treated. Will check blood cultures.   DYSPHAGIA UNSPECIFIED: History of same since her stroke. Concern given right-sided pneumonia. Family reports very little po intake over last several weeks. Patient was followed by speech therapy during her hospital stay, but her mental status did not permit a safe evaluation.  When presented with food, she would either keep it in her mouth or spit it out. She remains npo.  DM w/o Complication Type II: Not on any medications. Family reports patient lost weight over the last several years. Hemoglobin A1c 4.5. Serum glucose 84 on admission. See above   ANEMIA: Normocytic. Likely related to critical illness. Will transfuse for hemoglobin less than 7. No evidence of bleeding.  Will check stools for occult blood.   CVA: 2008 with residual right hemiparesis. Will request PT   PERIPHERAL VASCULAR DISEASE: Appears stable at baseline   GERD: Continue home PPI   Code Status: full code, family has rescinded DNR status Family Communication: discussed with son and daughters at the bedside today Disposition Plan:  transfer to [facility].  I have discussed the case with Dr. Wynelle Cleveland who has accepted the patient in transfer. Consider palliative care consult if patient does not improve.  If patient does start to improve, may be a candidate for LTAC since patient has been to [facility] in the past.   Consultants:  none  Procedures:  none  Antibiotics: Vancomycin October 04, 2013>>  Cefepime October 04, 2013>> Flagyl 09/15/13>>   HPI/Subjective: Sitting up in bed.  Denies any shortness of breath. Denies any pain, answers with only yes and no  Objective: Filed Vitals:   09/16/13 0718  BP: 93/58  Pulse: 90  Temp: 97.3 F (36.3 C)  Resp:     Intake/Output Summary (Last 24 hours) at 09/16/13 1107 Last data filed at 09/16/13 0700  Gross per 24 hour  Intake   2120 ml  Output   1250 ml  Net    870 ml   Filed Weights   09/14/13 0655 09/15/13 0500 09/16/13 0319  Weight: 110 kg (242 lb 8.1 oz) 116 kg (255 lb 11.7 oz) 117.6 kg (259 lb 4.2 oz)    Exam:   General:  Awake, appears comfortable, speech is incoherent  Cardiovascular: RRR No MGR, 2+ UE edema and 2-3+LE edema  Respiratory: normal effort but shallow. BS coarse. Weak cough effort. No wheeze  Abdomen: obese soft +BS tender in epigastric area, PEG tube in place  Musculoskeletal: no clubbing or cyanosis   Data Reviewed: Basic Metabolic Panel:  Recent Labs Lab  09/12/13 0505 09/13/13 1236 09/14/13 0445 09/15/13 0627 09/16/13 0553  NA 141 143 145 146 142  K 3.6* 3.6* 3.4* 3.7 3.4*  CL 107 110 112 114* 111  CO2 26 23 22 25 24   GLUCOSE 59* 123* 161* 152* 131*  BUN 30* 31* 32* 35* 40*  CREATININE 0.66 0.62 0.63 0.65 0.60  CALCIUM 7.4* 7.2* 7.1* 7.4* 7.2*   Liver Function Tests:  Recent Labs Lab 09/14/13 0445  AST 17  ALT 13  ALKPHOS 193*  BILITOT 0.2*  PROT 4.9*  ALBUMIN 0.6*   No results found for this basename: LIPASE, AMYLASE,  in the last 168 hours  Recent Labs Lab 09/15/13 1647  AMMONIA 89*   CBC:  Recent  Labs Lab 09/23/2013 1141 09/12/13 0505 09/13/13 1236 09/15/13 0627 09/16/13 0553  WBC 13.2* 14.4* 12.2* 19.7* 17.5*  NEUTROABS 6.1  --   --   --   --   HGB 9.2* 9.3* 8.9* 7.8* 7.4*  HCT 25.5* 26.6* 25.0* 22.2* 21.0*  MCV 87.6 89.3 89.0 88.4 89.0  PLT 267 255 192 131* 107*   Cardiac Enzymes: No results found for this basename: CKTOTAL, CKMB, CKMBINDEX, TROPONINI,  in the last 168 hours BNP (last 3 results) No results found for this basename: PROBNP,  in the last 8760 hours CBG:  Recent Labs Lab 09/15/13 1644 09/15/13 2216 09/15/13 2217 09/15/13 2219 09/16/13 0750  GLUCAP 135* 89 76 67* 110*    Recent Results (from the past 240 hour(s))  URINE CULTURE     Status: None   Collection Time    09/06/2013 12:17 PM      Result Value Range Status   Specimen Description URINE, CATHETERIZED   Final   Special Requests NONE   Final   Culture  Setup Time     Final   Value: 09/12/2013 00:48     Performed at [facility]     Final   Value: >=100,000 COLONIES/ML     Performed at [facility]   Culture     Final   Value: PROTEUS MIRABILIS     Performed at [facility]   Report Status 09/15/2013 FINAL   Final   Organism ID, Bacteria PROTEUS MIRABILIS   Final  WOUND CULTURE     Status: None   Collection Time    09/23/2013  4:18 PM      Result Value Range Status   Specimen Description DRAINAGE PEG SITE   Final   Special Requests NONE   Final   Gram Stain     Final   Value: FEW WBC PRESENT, PREDOMINANTLY PMN     FEW SQUAMOUS EPITHELIAL CELLS PRESENT     ABUNDANT GRAM POSITIVE RODS     FEW YEAST     FEW GRAM POSITIVE COCCI IN PAIRS   Culture     Final   Value: MODERATE METHICILLIN RESISTANT STAPHYLOCOCCUS AUREUS     Note: RIFAMPIN AND GENTAMICIN SHOULD NOT BE USED AS SINGLE DRUGS FOR TREATMENT OF STAPH INFECTIONS. CRITICAL RESULT CALLED TO, READ BACK BY AND VERIFIED WITH: TAMIKA WATKINS @ 10:10AM 09/16/13 BY DWEEKS     Performed at [facility]   Report Status 09/16/2013 FINAL   Final   Organism ID, Bacteria METHICILLIN RESISTANT STAPHYLOCOCCUS AUREUS   Final  MRSA PCR SCREENING     Status: Abnormal   Collection Time    09/10/2013  4:19 PM      Result Value  Range Status   MRSA by PCR POSITIVE (*) NEGATIVE Final   Comment: RESULT CALLED TO, READ BACK BY AND VERIFIED WITH:     MAYS J. AT 1841 ON [id number] BY THOMPSON S.                The GeneXpert MRSA Assay (FDA     approved for NASAL specimens     only), is one component of a     comprehensive MRSA colonization     surveillance program. It is not     intended to diagnose MRSA     infection nor to guide or     monitor treatment for     MRSA infections.  CULTURE, BLOOD (ROUTINE X 2)     Status: None   Collection Time    09/15/13  4:47 PM      Result Value Range Status   Specimen Description PORTA CATH   Final   Special Requests BOTTLES DRAWN AEROBIC AND ANAEROBIC 10CC EACH   Final   Culture NO GROWTH 1 DAY   Final   Report Status PENDING   Incomplete     Studies: Dg Chest Port 1 View  09/15/2013   CLINICAL DATA:  Dyspnea  EXAM: PORTABLE CHEST - 1 VIEW  COMPARISON:  Portable chest x-ray of September 13, 2013 and of 11 September 2013.  FINDINGS: Increased density in the right upper lobe has progressed since the 41 and is new since the 13th. The interstitial markings of both lungs remain increased. The cardiac silhouette is top-normal in size. There is a PICC line in place via the left upper extremity with the tip in the region of the midportion of the SVC. There is no evidence of a pleural effusion.  IMPRESSION: Increased density in the right upper lobe has developed since the previous study. This is superimposed upon a background of increased interstitial lung markings bilaterally. The findings are worrisome for progressive right upper lobe pneumonia. Increased interstitial markings elsewhere in both lungs may reflect interstitial edema or interstitial pneumonia. When the  patient can tolerate the procedure, a PA and lateral chest x-ray would be of value.   Electronically Signed   By: David  Martinique   On: 09/15/2013 16:34    Scheduled Meds: . albuterol  2.5 mg Nebulization Q6H  . antiseptic oral rinse  15 mL Mouth Rinse BID  . ceFEPime (MAXIPIME) IV  1 g Intravenous Q8H  . dextrose  25 mL Intravenous Once  . enoxaparin (LOVENOX) injection  40 mg Subcutaneous Q24H  . feeding supplement (PRO-STAT SUGAR FREE 64)  30 mL Per Tube BID  . ferrous sulfate  300 mg Per Tube Q breakfast  . free water  350 mL Per Tube Q4H  . furosemide  20 mg Intravenous Q12H  . hydrOXYzine  25 mg Per Tube BID  . insulin aspart  0-5 Units Subcutaneous QHS  . insulin aspart  0-9 Units Subcutaneous TID WC  . lactulose  20 g Oral TID  . levETIRAcetam  250 mg Per Tube BID  . metoCLOPramide  5 mg Per Tube BID  . metronidazole  500 mg Intravenous Q8H  . mirtazapine  7.5 mg Per Tube QHS  . mometasone-formoterol  2 puff Inhalation BID  . nystatin   Topical BID  . pantoprazole sodium  40 mg Per Tube Daily  . sertraline  50 mg Per Tube Daily  . sodium chloride  10-40 mL Intracatheter Q12H  . triamcinolone cream  1 application Topical  BID  . vancomycin  1,500 mg Intravenous Q48H   Continuous Infusions: . feeding supplement (OSMOLITE 1.5 CAL) 1,000 mL (09/15/13 1649)    Principal Problem:   Acute encephalopathy Active Problems:   DM w/o Complication Type II   Morbid obesity   ANEMIA   HYPERTENSION   Acute on chronic diastolic CHF (congestive heart failure)   CVA   PERIPHERAL VASCULAR DISEASE   GERD   DYSPHAGIA UNSPECIFIED   Healthcare-associated pneumonia   UTI (lower urinary tract infection)   Leukocytosis   HCAP (healthcare-associated pneumonia)   Hypoglycemia   Acute respiratory failure   Anasarca   Acute pulmonary edema   Protein-calorie malnutrition, severe    Time spent: 45 minutes    [facility] Pager [phone]. If 7PM-7AM, please  contact night-coverage at [email or web address], password [facility] 09/16/2013, 11:07 AM  LOS: 5 days

## 2013-09-16 NOTE — Progress Notes (Signed)
Notified Dr. Roderic Palau to discuss the patient family concerns of the patient being DNR and to the fact that the patient family would like to be changed back to a full code since they see an improvement in her.  I also voiced the request for transfer to either [facility] or [facility] since she had been there before.  They would like for her to be transferred today.  MD verbalized understanding and stated that he would see them shortly.

## 2013-09-16 NOTE — Progress Notes (Signed)
[facility]  Evaluated Ms Tiedt - pt is alert, disoriented and quite weak with anasarca and rhonci.  Spoke with 2 daughters, Joycelyn Schmid and Olivia Mackie.  Discussed with family that prognosis is poor but we will continue to treat for now. I agree with Dr Blythe Stanford plan.   Debbe Odea, MD [phone]

## 2013-09-16 NOTE — Progress Notes (Signed)
Patient unable to follow commands to use Dulera MDI with spacer.

## 2013-09-17 DIAGNOSIS — R5383 Other fatigue: Secondary | ICD-10-CM

## 2013-09-17 DIAGNOSIS — R5381 Other malaise: Secondary | ICD-10-CM

## 2013-09-17 DIAGNOSIS — R531 Weakness: Secondary | ICD-10-CM

## 2013-09-17 DIAGNOSIS — R6521 Severe sepsis with septic shock: Secondary | ICD-10-CM

## 2013-09-17 DIAGNOSIS — A419 Sepsis, unspecified organism: Secondary | ICD-10-CM

## 2013-09-17 DIAGNOSIS — Z515 Encounter for palliative care: Secondary | ICD-10-CM

## 2013-09-17 LAB — GLUCOSE, CAPILLARY
GLUCOSE-CAPILLARY: 118 mg/dL — AB (ref 70–99)
GLUCOSE-CAPILLARY: 189 mg/dL — AB (ref 70–99)
GLUCOSE-CAPILLARY: 201 mg/dL — AB (ref 70–99)
Glucose-Capillary: 211 mg/dL — ABNORMAL HIGH (ref 70–99)

## 2013-09-17 LAB — CBC
HCT: 24.1 % — ABNORMAL LOW (ref 36.0–46.0)
Hemoglobin: 8.3 g/dL — ABNORMAL LOW (ref 12.0–15.0)
MCH: 30.6 pg (ref 26.0–34.0)
MCHC: 34.4 g/dL (ref 30.0–36.0)
MCV: 88.9 fL (ref 78.0–100.0)
Platelets: 132 10*3/uL — ABNORMAL LOW (ref 150–400)
RBC: 2.71 MIL/uL — ABNORMAL LOW (ref 3.87–5.11)
RDW: 19 % — AB (ref 11.5–15.5)
WBC: 22.6 10*3/uL — ABNORMAL HIGH (ref 4.0–10.5)

## 2013-09-17 LAB — COMPREHENSIVE METABOLIC PANEL
ALBUMIN: 0.6 g/dL — AB (ref 3.5–5.2)
ALK PHOS: 175 U/L — AB (ref 39–117)
ALT: 18 U/L (ref 0–35)
AST: 31 U/L (ref 0–37)
BUN: 40 mg/dL — AB (ref 6–23)
CALCIUM: 7.4 mg/dL — AB (ref 8.4–10.5)
CO2: 21 mEq/L (ref 19–32)
Chloride: 113 mEq/L — ABNORMAL HIGH (ref 96–112)
Creatinine, Ser: 0.63 mg/dL (ref 0.50–1.10)
GFR calc non Af Amer: 83 mL/min — ABNORMAL LOW (ref >90)
Glucose, Bld: 189 mg/dL — ABNORMAL HIGH (ref 70–99)
POTASSIUM: 3.3 meq/L — AB (ref 3.7–5.3)
Sodium: 144 mEq/L (ref 137–147)
TOTAL PROTEIN: 4.8 g/dL — AB (ref 6.0–8.3)
Total Bilirubin: 0.3 mg/dL (ref 0.3–1.2)

## 2013-09-17 LAB — PROCALCITONIN: Procalcitonin: 0.51 ng/mL

## 2013-09-17 LAB — CORTISOL: Cortisol, Plasma: 9.3 ug/dL

## 2013-09-17 LAB — LACTIC ACID, PLASMA: Lactic Acid, Venous: 3.1 mmol/L — ABNORMAL HIGH (ref 0.5–2.2)

## 2013-09-17 MED ORDER — HYDROCORTISONE SOD SUCCINATE 100 MG IJ SOLR
50.0000 mg | Freq: Four times a day (QID) | INTRAMUSCULAR | Status: DC
  Administered 2013-09-17: 50 mg via INTRAVENOUS
  Filled 2013-09-17 (×4): qty 1

## 2013-09-17 MED ORDER — SODIUM CHLORIDE 0.9 % IV SOLN
5.0000 mg/h | INTRAVENOUS | Status: DC
  Administered 2013-09-17 – 2013-09-18 (×4): 22 mg/h via INTRAVENOUS
  Filled 2013-09-17 (×4): qty 10

## 2013-09-17 MED ORDER — MORPHINE BOLUS VIA INFUSION
5.0000 mg | INTRAVENOUS | Status: DC | PRN
  Administered 2013-09-17: 4 mg via INTRAVENOUS
  Administered 2013-09-17: 10 mg via INTRAVENOUS
  Administered 2013-09-17: 17 mg via INTRAVENOUS
  Administered 2013-09-17: 13 mg via INTRAVENOUS
  Filled 2013-09-17: qty 20

## 2013-09-17 MED ORDER — NOREPINEPHRINE BITARTRATE 1 MG/ML IJ SOLN
2.0000 ug/min | INTRAVENOUS | Status: DC
  Administered 2013-09-17: 25 ug/min via INTRAVENOUS
  Filled 2013-09-17 (×2): qty 16

## 2013-09-17 MED ORDER — POTASSIUM CHLORIDE 20 MEQ/15ML (10%) PO LIQD
ORAL | Status: AC
  Administered 2013-09-17: 40 meq
  Filled 2013-09-17: qty 30

## 2013-09-17 MED ORDER — SODIUM CHLORIDE 0.9 % IV SOLN
INTRAVENOUS | Status: DC
  Administered 2013-09-17: 18:00:00 via INTRAVENOUS
  Filled 2013-09-17 (×3): qty 100

## 2013-09-17 MED ORDER — FREE WATER
200.0000 mL | Freq: Four times a day (QID) | Status: DC
  Administered 2013-09-17: 200 mL

## 2013-09-17 MED ORDER — FENTANYL CITRATE 0.05 MG/ML IJ SOLN: 25.0000 ug | INTRAMUSCULAR | Status: DC | PRN

## 2013-09-17 MED ORDER — MORPHINE SULFATE 10 MG/ML IJ SOLN
10.0000 mg/h | INTRAVENOUS | Status: DC
  Filled 2013-09-17: qty 10

## 2013-09-17 MED ORDER — SODIUM CHLORIDE 0.9 % IV SOLN: INTRAVENOUS | Status: DC

## 2013-09-17 MED ORDER — SODIUM CHLORIDE 0.9 % IV SOLN
0.0300 [IU]/min | INTRAVENOUS | Status: DC
Start: 2013-09-17 — End: 2013-09-17
  Administered 2013-09-17: 0.03 [IU]/min via INTRAVENOUS
  Filled 2013-09-17: qty 2.5

## 2013-09-17 MED ORDER — POTASSIUM CHLORIDE 20 MEQ/15ML (10%) PO LIQD
40.0000 meq | ORAL | Status: AC
  Administered 2013-09-17: 40 meq
  Filled 2013-09-17: qty 30

## 2013-09-17 MED ORDER — DEXTROSE 5 % IV SOLN
2.0000 g | INTRAVENOUS | Status: DC
  Filled 2013-09-17: qty 2

## 2013-09-17 NOTE — Progress Notes (Signed)
Name: [name] MRN: [id number] DOB: [birth year]    ADMISSION DATE:  09/24/13 CONSULTATION DATE:  09/16/13  REFERRING MD :  Debbe Odea, MD PRIMARY SERVICE: PCCM  CHIEF COMPLAINT:  Hypotension  BRIEF PATIENT DESCRIPTION:  80 years old female , NHR since 1,with PMH relevant for DM, CHF, PVD, obesity, stroke with right hemiparesis, dysphagia post PEG tube placement, severe malnutrition, anasarca, MRSA. Adm to [facility] 1/13 for right upper lobe pneumonia, proteus mirabilis urinary tract infection, infected PEG tube site with MRSA, hypoglycemia. Course complicated by anasarca requiring diuresis, hypotension, diarrhea with lactulose (due to high ammonia)   1/18 Transferred from [facility] to [facility] under [place] for [place] and HCAP. In the stepdown with persistent hypotension in the setting of diuresis for her anasarca. PCCM consult called to assist with management.   SIGNIFICANT EVENTS / STUDIES:  1/18 tr from [facility]  1/18 levophed gtt   LINES / TUBES: - PICC line 1/13 >> - Foley catheter - PEG tube  CULTURES: - Blood culture 09/15/13 negative to date - Wound culture (PEG site): 1/13 >>MRSA -urine 1/13 Proteus >>  ANTIBIOTICS: - Cefepime - Vancomycin  HISTORY OF PRESENT ILLNESS:   80 years old female with PMH relevant for DM, CHF, PVD, obesity, stroke with right hemiparesis, dysphagia post PEG tube placement, severe malnutrition, anasarca, MRSA. Transferred from [facility] to [facility] under [facility] for [place] and HCAP. In the stepdown with persistent hypotension in the setting of diuresis for her anasarca. PCCM consult called to assist with management. At the time of my examination the patient is lethargic but arousable. Oriented in person and place. BP is 70/30, HR in the 90's, saturating 100% on 2 L [place].   SUBJECTIVE: afebrile Remains on levophed gtt Poor UO  VITAL SIGNS: Temp:  [96.5 F (35.8 C)-97.9 F (36.6 C)] 97.9 F (36.6 C) (01/19 0800) Pulse Rate:  [44-118]  96 (01/19 1300) Resp:  [21-35] 35 (01/19 1300) BP: (72-122)/(20-60) 97/31 mmHg (01/19 1300) SpO2:  [97 %-100 %] 100 % (01/19 1300) Weight:  [250 lb (113.4 kg)] 250 lb (113.4 kg) (01/19 0100) HEMODYNAMICS:   VENTILATOR SETTINGS:   INTAKE / OUTPUT: Intake/Output     01/18 0701 - 01/19 0700 01/19 0701 - 01/20 0700   I.V. (mL/kg) 382.5 (3.4) 350.6 (3.1)   NG/GT 1100 300   IV Piggyback 1100 200   Total Intake(mL/kg) 2582.5 (22.8) 850.6 (7.5)   Urine (mL/kg/hr) 490 (0.2) 40 (0)   Total Output 490 40   Net +2092.5 +810.6          PHYSICAL EXAMINATION: General: Female patient in no acute distress. Ill appearance.  Eyes: Anicteric sclerae. ENT: Oropharynx clear. Dry mucous membranes. No thrush Lymph: No cervical, supraclavicular, or axillary lymphadenopathy. Heart: Normal S1, S2. No murmurs, rubs, or gallops appreciated. No bruits, equal pulses. Lungs: Normal excursion, no dullness to percussion. Bilateral scattered crackles.  Abdomen: Abdomen soft, non-tender. PEG tube in place.  Musculoskeletal: Severe anasarca Skin: No rashes or lesions Neuro: Right hemiparesis, int follows commands, lethargic   LABS:  CBC  Recent Labs Lab 09/15/13 0627 09/16/13 0553 09/17/13 0240  WBC 19.7* 17.5* 22.6*  HGB 7.8* 7.4* 8.3*  HCT 22.2* 21.0* 24.1*  PLT 131* 107* 132*   Coag's No results found for this basename: APTT, INR,  in the last 168 hours BMET  Recent Labs Lab 09/15/13 0627 09/16/13 0553 09/17/13 0240  NA 146 142 144  K 3.7 3.4* 3.3*  CL 114* 111 113*  CO2 25 24 21   BUN 35* 40* 40*  CREATININE 0.65 0.60 0.63  GLUCOSE 152* 131* 189*   Electrolytes  Recent Labs Lab 09/15/13 0627 09/16/13 0553 09/17/13 0240  CALCIUM 7.4* 7.2* 7.4*   Sepsis Markers  Recent Labs Lab 09/17/13 0900  LATICACIDVEN 3.1*  PROCALCITON 0.51   ABG No results found for this basename: PHART, PCO2ART, PO2ART,  in the last 168 hours Liver Enzymes  Recent Labs Lab 09/14/13 0445  09/17/13 0240  AST 17 31  ALT 13 18  ALKPHOS 193* 175*  BILITOT 0.2* 0.3  ALBUMIN 0.6* 0.6*   Cardiac Enzymes No results found for this basename: TROPONINI, PROBNP,  in the last 168 hours Glucose  Recent Labs Lab 09/16/13 1537 09/16/13 2002 09/17/13 0053 09/17/13 0524 09/17/13 0730 09/17/13 1253  GLUCAP 135* 118* 118* 201* 189* 211*    Imaging No results found.    ASSESSMENT / PLAN:  PULMONARY A: 1) HCAP P:   - Cefepime - Vancomycin  CARDIOVASCULAR A:  1) Hypotension likely secondary to intravascular volume depletion, anasarca, severe malnutrition and possibly sepsis. P:  - - ctnorepinephrine infusion via PICC line -Add vaso - Will start albumin BID  RENAL A:   1) Normal creatinine, likely not a reliable marker of kidney function in the setting of severe malnutrition.  P:   - Will follow BMP  GASTROINTESTINAL A:   1) Severe malnutrition 2) GERD 3) dysphagia s/p PEG P:   - Continue tube feedings - Protonix  HEMATOLOGIC A:   1) Anemia likely secondary to chronic disease P:  - Transfuse PRBC's for Hgb < 7  INFECTIOUS A:   1) HCAP 2) MRSA wound infection P:   - Cefepime  - Vancomycin - Follow blood cultures  ENDOCRINE A:   1) DM 2) Consider adrenal insufficiency P:   - Continue novolog sliding scale - Morning cortisol -Add stress dose steroids  NEUROLOGIC A:   1) Altered mental status likely secondary to acute illness  1/18  extensive conversation with the patient's family and explained her very poor prognosis despite aggressive interventions. The family would like to continue aggressive measures and confirmed her full code status.  Initial DNR was reversed Proceed with palliative care consult   I have personally obtained a history, examined the patient, evaluated laboratory and imaging results, formulated the assessment and plan and placed orders. CRITICAL CARE: Critical Care Time devoted to patient care services described in  this note is 45 minutes.   Kara Mead MD. Shade Flood. [facility] Pager [phone] If no response call [phone]    Addendum - Long conversation with family including 2 daughters, explained  Course of septic shock in someone with her comorbidities & poor prognosis. They agreed to care limitations, limited code issued. I emphasized that comfort should be important while we are pursuing full medical care & will write for fentanyl for pain.  Horacio Werth V.  09/17/2013, 4:46 PM

## 2013-09-17 NOTE — Consult Note (Signed)
Patient [name]      DOB: 04-30-1932      POE:[id number]     Consult Note from the Palliative Medicine Team at [place] Requested by: Dr Elsworth Soho     PCP: Tula Nakayama, MD Reason for Consultation:Clarification of [place] and options     Phone Number:[phone]  Assessment of patients Current state:  80 years old female , NHR since 27,with PMH relevant for DM, CHF, PVD, obesity, stroke with right hemiparesis, dysphagia post PEG tube placement, severe malnutrition, anasarca, MRSA.  Adm to [facility] 1/13 for right upper lobe pneumonia, proteus mirabilis urinary tract infection, infected PEG tube site with MRSA, hypoglycemia.  Course complicated by anasarca requiring diuresis, hypotension, diarrhea with lactulose (due to high ammonia)  1/18 Transferred from [facility] to [place] and HCAP.  Persistent hypotension in the setting of diuresis for her anasarca.   Overall poor prognosis, family is faced with EOL decisions    This NP Wadie Lessen reviewed medical records, received report from team, assessed the patient and then meet at the patient's bedside along with her family to include daughters Armandina Stammer # [phone] and Johnnye Sima # [phone], several cousins and a sister to discuss diagnosis prognosis, GOC, EOL wishes disposition and options.   A detailed discussion was had today regarding advanced directives.  Concepts specific to code status, artifical feeding and hydration, continued IV antibiotics and rehospitalization was had.  The difference between a aggressive medical intervention path  and a palliative comfort care path for this patient at this time was had.  Values and goals of care important to patient and family were attempted to be elicited.  Concept of Hospice and Palliative Care were discussed  Natural trajectory and expectations at EOL were discussed.  Questions and concerns addressed.  Hard Choices booklet left for review. Family encouraged to call  with questions or concerns.  PMT will continue to support holistically.    Goals of Care: 1.  Code Status: Partial  -no CPR, compressions, intubation   2. Scope of Treatment:  -Continue current treatment plan, family is hopeful for improvement.  They are now able to verbalize no desire for intubation, CPR, compressions.  4. Disposition: Dependant on outcomes   3. Symptom Management:   1. Pain: Fentanyl 25-50 mcg every 2 hrs IV prn  2. Failure to thrive  4. Psychosocial:  Emotional support offered to family.  Questions and concerns addressed.        Patient Documents Completed or Given: Document Given Completed  Advanced Directives Pkt    MOST yes   DNR    Gone from My Sight    Hard Choices yes     Brief HPI:80 years old female , NHR since 68,with PMH relevant for DM, CHF, PVD, obesity, stroke with right hemiparesis, dysphagia post PEG tube placement, severe malnutrition, anasarca, MRSA.  Adm to [facility] 1/13 for right upper lobe pneumonia, proteus mirabilis urinary tract infection, infected PEG tube site with MRSA, hypoglycemia.  Course complicated by anasarca requiring diuresis, hypotension, diarrhea with lactulose (due to high ammonia)  1/18 Transferred from [facility] to [place] and HCAP.  Persistent hypotension in the setting of diuresis for her anasarca.     IWP:YKDXIP to illicit due to altered cognition    PMH:  Past Medical History  Diagnosis Date  . GERD (gastroesophageal reflux disease)   . Dysphagia 2008    LAST BPE 2011: SEVERE EMD  . Stroke 2010  R HEMIPARESIS  . Left carotid artery occlusion   . Obesity, morbid (more than 100 lbs over ideal weight or BMI > 40)   . Diabetes mellitus   . Encounter for PEG (percutaneous endoscopic gastrostomy) SEP 2011 [facility] RADS  . Restless leg syndrome   . Hyperlipemia   . PVD (peripheral vascular disease)   . Asthma   . CHF (congestive heart failure)   . Edema   . Hemiplegia   . Insomnia   . Anemia   .  MRSA infection   . TIA (transient ischemic attack)   . Foreign body in bladder and urethra   . Osteoporosis   . Depression   . Cerebral atherosclerosis      PSH: Past Surgical History  Procedure Laterality Date  . Colonoscopy  2008    SIMPLE ADENOMA, bX: NEG FOR MICROSCOPIC COLITIS  . Ventral hernia repair  2005  . Upper gastrointestinal endoscopy  2005 NUR    W/ DILATATION  . Peg placement  07/12/2011 20 FR     MAY 2-20 FR BS BALLOON PEG   I have reviewed the [facility] and SH and  If appropriate update it with new information. Allergies  Allergen Reactions  . Macrobid [Nitrofurantoin] Other (See Comments)    Unknown reactions, allergy noted on MAR  . Penicillins Itching and Rash   Scheduled Meds: . albumin human  25 g Intravenous BID  . albuterol  2.5 mg Nebulization Q6H  . antiseptic oral rinse  15 mL Mouth Rinse BID  . [START ON 09/26/2013] ceFEPime (MAXIPIME) IV  2 g Intravenous Q24H  . dextrose  25 mL Intravenous Once  . enoxaparin (LOVENOX) injection  40 mg Subcutaneous Q24H  . feeding supplement (PRO-STAT SUGAR FREE 64)  30 mL Per Tube BID  . ferrous sulfate  300 mg Per Tube Q breakfast  . free water  200 mL Per Tube Q6H  . hydrocortisone sod succinate (SOLU-CORTEF) inj  50 mg Intravenous Q6H  . hydrOXYzine  25 mg Per Tube BID  . insulin aspart  0-5 Units Subcutaneous QHS  . insulin aspart  0-9 Units Subcutaneous TID WC  . levETIRAcetam  250 mg Per Tube BID  . metoCLOPramide  5 mg Per Tube BID  . metronidazole  500 mg Intravenous Q8H  . mirtazapine  7.5 mg Per Tube QHS  . nystatin   Topical BID  . sertraline  50 mg Per Tube Daily  . sodium chloride  10-40 mL Intracatheter Q12H  . triamcinolone cream  1 application Topical BID  . vancomycin  1,500 mg Intravenous Q48H   Continuous Infusions: . sodium chloride 20 mL/hr at 09/17/13 0700  . feeding supplement (OSMOLITE 1.5 CAL) 1,000 mL (09/17/13 0000)  . norepinephrine (LEVOPHED) Adult infusion 40 mcg/min (09/17/13  1300)  . norepinephrine (LEVOPHED) Adult infusion 20 mcg/min (09/17/13 3086)  . vasopressin (PITRESSIN) infusion - *FOR SHOCK* 0.03 Units/min (09/17/13 0918)   PRN Meds:.acetaminophen, acetaminophen, alum & mag hydroxide-simeth, fentaNYL, guaiFENesin-dextromethorphan, ondansetron (ZOFRAN) IV, ondansetron, polyethylene glycol, sodium chloride, traMADol    BP 97/31  Pulse 96  Temp(Src) 97.9 F (36.6 C) (Oral)  Resp 35  Ht 5' 8.5" (1.74 m)  Wt 250 lb (113.4 kg)  BMI 37.46 kg/m2  SpO2 100%   PPS:20 % at best   Intake/Output Summary (Last 24 hours) at 09/17/13 1514 Last data filed at 09/17/13 1300  Gross per 24 hour  Intake 3433.11 ml  Output    530 ml  Net 2903.11 ml  Physical Exam:  General: ill appearing, ICU supported HEENT:  Mm, audible throat secretions Chest:   Scattered coarse BS, decreased in bases CVS: RRR Abdomen: noted PEG, +bs Ext: gross anasarca, cool to touch Neuro:lethargic, unable to follow commands  Labs: CBC    Component Value Date/Time   WBC 22.6* 09/17/2013 0240   RBC 2.71* 09/17/2013 0240   HGB 8.3* 09/17/2013 0240   HCT 24.1* 09/17/2013 0240   PLT 132* 09/17/2013 0240   MCV 88.9 09/17/2013 0240   MCH 30.6 09/17/2013 0240   MCHC 34.4 09/17/2013 0240   RDW 19.0* 09/17/2013 0240   LYMPHSABS 5.5* 09/01/2013 1141   MONOABS 1.0 09/04/2013 1141   EOSABS 0.6 09/21/2013 1141   BASOSABS 0.1 09/12/2013 1141    BMET    Component Value Date/Time   NA 144 09/17/2013 0240   K 3.3* 09/17/2013 0240   CL 113* 09/17/2013 0240   CO2 21 09/17/2013 0240   GLUCOSE 189* 09/17/2013 0240   BUN 40* 09/17/2013 0240   CREATININE 0.63 09/17/2013 0240   CALCIUM 7.4* 09/17/2013 0240   GFRNONAA 83* 09/17/2013 0240   GFRAA >90 09/17/2013 0240    CMP     Component Value Date/Time   NA 144 09/17/2013 0240   K 3.3* 09/17/2013 0240   CL 113* 09/17/2013 0240   CO2 21 09/17/2013 0240   GLUCOSE 189* 09/17/2013 0240   BUN 40* 09/17/2013 0240   CREATININE 0.63 09/17/2013 0240   CALCIUM  7.4* 09/17/2013 0240   PROT 4.8* 09/17/2013 0240   ALBUMIN 0.6* 09/17/2013 0240   AST 31 09/17/2013 0240   ALT 18 09/17/2013 0240   ALKPHOS 175* 09/17/2013 0240   BILITOT 0.3 09/17/2013 0240   GFRNONAA 83* 09/17/2013 0240   GFRAA >90 09/17/2013 0240     Time In Time Out Total Time Spent with Patient Total Overall Time  1430 1600 80 min 90 min    Greater than 50%  of this time was spent counseling and coordinating care related to the above assessment and plan.  Wadie Lessen NP  Palliative Medicine Team Team Phone # [phone] Pager [phone]

## 2013-09-17 NOTE — Procedures (Signed)
Arterial Catheter Insertion Procedure Note [name] [id number] July 31, 1932  Procedure: Insertion of Arterial Catheter  Indications: Blood pressure monitoring  Procedure Details Consent: Risks of procedure as well as the alternatives and risks of each were explained to the (patient/caregiver).  Consent for procedure obtained. Time Out: Verified patient identification, verified procedure, site/side was marked, verified correct patient position, special equipment/implants available, medications/allergies/relevent history reviewed, required imaging and test results available.  Performed  Maximum sterile technique was used including antiseptics, cap, gloves, gown, hand hygiene, mask and sheet. Skin prep: Chlorhexidine; local anesthetic administered 20 gauge catheter was inserted into right radial artery using the Seldinger technique.  Evaluation Blood flow good; BP tracing good. Complications: No apparent complications.   Phillis Knack [facility] 09/17/2013

## 2013-09-17 NOTE — Progress Notes (Signed)
Name: [name] MRN: [id number] DOB: 1931-11-08    ADMISSION DATE:  09/17/2013 CONSULTATION DATE:  09/16/13  REFERRING MD :  Debbe Odea, MD PRIMARY SERVICE: PCCM  CHIEF COMPLAINT:  Hypotension  BRIEF PATIENT DESCRIPTION:  80 years old female , NHR since 21,with PMH relevant for DM, CHF, PVD, obesity, stroke with right hemiparesis, dysphagia post PEG tube placement, severe malnutrition, anasarca, MRSA. Adm to [facility] 1[facility]13 for right upper lobe pneumonia, proteus mirabilis urinary tract infection, infected PEG tube site with MRSA, hypoglycemia. Course complicated by anasarca requiring diuresis, hypotension, diarrhea with lactulose (due to high ammonia)   1/18 Transferred [facility] under [name] for [place] and HCAP. In the stepdown with persistent hypotension in the setting of diuresis for her anasarca. PCCM consult called to assist with management.   SIGNIFICANT EVENTS / STUDIES:  1/18 tr from [facility]  1/18 levophed gtt   LINES / TUBES: - PICC line 1/13 >> - Foley catheter - PEG tube  CULTURES: - Blood culture 09/15/13 negative to date - Wound culture (PEG site): 1/13 >>MRSA -urine 1/13 Proteus >>  ANTIBIOTICS: - Cefepime - Vancomycin  HISTORY OF PRESENT ILLNESS:   80 years old female with PMH relevant for DM, CHF, PVD, obesity, stroke with right hemiparesis, dysphagia post PEG tube placement, severe malnutrition, anasarca, MRSA. Transferred [facility] under [facility] for [place] and HCAP. In the stepdown with persistent hypotension in the setting of diuresis for her anasarca. PCCM consult called to assist with management. At the time of my examination the patient is lethargic but arousable. Oriented in person and place. BP is 70/30, HR in the 90's, saturating 100% on 2 L [place].   SUBJECTIVE: afebrile Remains on levophed gtt Poor UO  VITAL SIGNS: Temp:  [96.5 F (35.8 C)-98 F (36.7 C)] 96.6 F (35.9 C) (01/19 0430) Pulse Rate:  [44-97] 81  (01/19 0730) Resp:  [20-29] 24 (01/19 0730) BP: (72-113)/(20-66) 98/31 mmHg (01/19 0730) SpO2:  [97 %-100 %] 100 % (01/19 0730) Weight:  [113.4 kg (250 lb)] 113.4 kg (250 lb) (01/19 0100) HEMODYNAMICS:   VENTILATOR SETTINGS:   INTAKE / OUTPUT: Intake/Output     01/18 0701 - 01/19 0700 01/19 0701 - 01/20 0700   I.V. (mL/kg) 382.5 (3.4)    NG/GT 1100    IV Piggyback 1100 100   Total Intake(mL/kg) 2582.5 (22.8) 100 (0.9)   Urine (mL/kg/hr) 490 (0.2)    Total Output 490     Net +2092.5 +100          PHYSICAL EXAMINATION: General: Female patient in no acute distress. Ill appearance.  Eyes: Anicteric sclerae. ENT: Oropharynx clear. Dry mucous membranes. No thrush Lymph: No cervical, supraclavicular, or axillary lymphadenopathy. Heart: Normal S1, S2. No murmurs, rubs, or gallops appreciated. No bruits, equal pulses. Lungs: Normal excursion, no dullness to percussion. Bilateral scattered crackles.  Abdomen: Abdomen soft, non-tender. PEG tube in place.  Musculoskeletal: Severe anasarca Skin: No rashes or lesions Neuro: Right hemiparesis, int follows commands, lethargic   LABS:  CBC  Recent Labs Lab 09/15/13 0627 09/16/13 0553 09/17/13 0240  WBC 19.7* 17.5* 22.6*  HGB 7.8* 7.4* 8.3*  HCT 22.2* 21.0* 24.1*  PLT 131* 107* 132*   Coag's No results found for this basename: APTT, INR,  in the last 168 hours BMET  Recent Labs Lab 09/15/13 0627 09/16/13 0553 09/17/13 0240  NA 146 142 144  K 3.7 3.4* 3.3*  CL 114* 111 113*  CO2 25 24 21   BUN 35* 40* 40*  CREATININE 0.65 0.60 0.63  GLUCOSE 152* 131* 189*   Electrolytes  Recent Labs Lab 09/15/13 0627 09/16/13 0553 09/17/13 0240  CALCIUM 7.4* 7.2* 7.4*   Sepsis Markers No results found for this basename: LATICACIDVEN, PROCALCITON, O2SATVEN,  in the last 168 hours ABG No results found for this basename: PHART, PCO2ART, PO2ART,  in the last 168 hours Liver Enzymes  Recent Labs Lab 09/14/13 0445  09/17/13 0240  AST 17 31  ALT 13 18  ALKPHOS 193* 175*  BILITOT 0.2* 0.3  ALBUMIN 0.6* 0.6*   Cardiac Enzymes No results found for this basename: TROPONINI, PROBNP,  in the last 168 hours Glucose  Recent Labs Lab 09/16/13 1408 09/16/13 1537 09/16/13 2002 09/17/13 0053 09/17/13 0524 09/17/13 0730  GLUCAP 69* 135* 118* 118* 201* 189*    Imaging Dg Chest Port 1 View  09/15/2013   CLINICAL DATA:  Dyspnea  EXAM: PORTABLE CHEST - 1 VIEW  COMPARISON:  Portable chest x-ray of September 13, 2013 and of 10-05-2013.  FINDINGS: Increased density in the right upper lobe has progressed since the 55 and is new since the 13th. The interstitial markings of both lungs remain increased. The cardiac silhouette is top-normal in size. There is a PICC line in place via the left upper extremity with the tip in the region of the midportion of the SVC. There is no evidence of a pleural effusion.  IMPRESSION: Increased density in the right upper lobe has developed since the previous study. This is superimposed upon a background of increased interstitial lung markings bilaterally. The findings are worrisome for progressive right upper lobe pneumonia. Increased interstitial markings elsewhere in both lungs may reflect interstitial edema or interstitial pneumonia. When the patient can tolerate the procedure, a PA and lateral chest x-ray would be of value.   Electronically Signed   By: David  Martinique   On: 09/15/2013 16:34      ASSESSMENT / PLAN:  PULMONARY A: 1) HCAP P:   - Cefepime - Vancomycin  CARDIOVASCULAR A:  1) Hypotension likely secondary to intravascular volume depletion, anasarca, severe malnutrition and possibly sepsis. P:  - - ctnorepinephrine infusion via PICC line -Add vaso - Will start albumin BID  RENAL A:   1) Normal creatinine, likely not a reliable marker of kidney function in the setting of severe malnutrition.  P:   - Will follow BMP  GASTROINTESTINAL A:   1) Severe  malnutrition 2) GERD 3) dysphagia s/p PEG P:   - Continue tube feedings - Protonix  HEMATOLOGIC A:   1) Anemia likely secondary to chronic disease P:  - Transfuse PRBC's for Hgb < 7  INFECTIOUS A:   1) HCAP 2) MRSA wound infection P:   - Cefepime  - Vancomycin - Follow blood cultures  ENDOCRINE A:   1) DM 2) Consider adrenal insufficiency P:   - Continue novolog sliding scale - Morning cortisol -Add stress dose steroids  NEUROLOGIC A:   1) Altered mental status likely secondary to acute illness  1/18  extensive conversation with the patient's family and explained her very poor prognosis despite aggressive interventions. The family would like to continue aggressive measures and confirmed her full code status.  Initial DNR was reversed Proceed with palliative care consult   I have personally obtained a history, examined the patient, evaluated laboratory and imaging results, formulated the assessment and plan and placed orders. CRITICAL CARE: Critical Care Time devoted to  patient care services described in this note is 45 minutes.   Kara Mead MD. Shade Flood. [facility] Pager [phone] If no response call [phone]    09/17/2013, 8:25 AM

## 2013-09-17 NOTE — Progress Notes (Signed)
[place] for Vancomycin and Cefepime Indication: pneumonia  Allergies  Allergen Reactions  . Macrobid [Nitrofurantoin] Other (See Comments)    Unknown reactions, allergy noted on MAR  . Penicillins Itching and Rash   Patient Measurements: Height: 5' 8.5" (174 cm) Weight: 250 lb (113.4 kg) IBW/kg (Calculated) : 65.05  Vital Signs: Temp: 96.6 F (35.9 C) (01/19 0430) BP: 98/31 mmHg (01/19 0730) Pulse Rate: 81 (01/19 0730) Intake/Output from previous day: 01/18 0701 - 01/19 0700 In: 2582.5 [I.V.:382.5; NG/GT:1100; IV Piggyback:1100] Out: 490 [Urine:490] Intake/Output from this shift: Total I/O In: 100 [IV Piggyback:100] Out: -   Labs:  Recent Labs  09/15/13 0627 09/16/13 0553 09/17/13 0240  WBC 19.7* 17.5* 22.6*  HGB 7.8* 7.4* 8.3*  PLT 131* 107* 132*  CREATININE 0.65 0.60 0.63   Estimated Creatinine Clearance: 76 ml/min (by C-G formula based on Cr of 0.63).  Recent Labs  09/14/13 1316  VANCORANDOM 39.5      Medications:  Scheduled:  . albumin human  25 g Intravenous BID  . albuterol  2.5 mg Nebulization Q6H  . antiseptic oral rinse  15 mL Mouth Rinse BID  . ceFEPime (MAXIPIME) IV  1 g Intravenous Q8H  . dextrose  25 mL Intravenous Once  . enoxaparin (LOVENOX) injection  40 mg Subcutaneous Q24H  . feeding supplement (PRO-STAT SUGAR FREE 64)  30 mL Per Tube BID  . ferrous sulfate  300 mg Per Tube Q breakfast  . free water  200 mL Per Tube Q6H  . hydrocortisone sod succinate (SOLU-CORTEF) inj  50 mg Intravenous Q6H  . hydrOXYzine  25 mg Per Tube BID  . insulin aspart  0-5 Units Subcutaneous QHS  . insulin aspart  0-9 Units Subcutaneous TID WC  . levETIRAcetam  250 mg Per Tube BID  . metoCLOPramide  5 mg Per Tube BID  . metronidazole  500 mg Intravenous Q8H  . mirtazapine  7.5 mg Per Tube QHS  . nystatin   Topical BID  . sertraline  50 mg Per Tube Daily  . sodium chloride  10-40 mL Intracatheter Q12H  . triamcinolone  cream  1 application Topical BID  . vancomycin  1,500 mg Intravenous Q48H    Assessment: 80 yo female from [facility] with HCAP and MRSA wound infection on cefepime and vancomycin. WBC= 22.6, hypothermic. SCr= 0.63 and UOP 0.4 ml/kg/hr (noted on pressors).  Last vancomycin level was 39.5 on 09/14/13 and patient now on vancomycin 1500mg  IV q48hr.  Current kinetics estimate a t 1/2 of about 40 hrs and SCr is likely not a reliable indicator of renal function.  Vancomycin 1/13 >> Cefepime 1/13 >>  Goal of Therapy:  Vancomycin trough level 15-20 mcg/ml  Plan:  -Due to vancomycin estimates will also renal adjust cefepime to 2gm IV q24hr -No vancomycin changes needed -Consider defining length of therapy  Hildred Laser, Pharm D 09/17/2013 9:00 AM

## 2013-09-17 NOTE — Progress Notes (Signed)
eLink Physician-Brief Progress Note Patient Name: [name] DOB: February 10, 1932 MRN: [id number]  Date of Service  09/17/2013   HPI/Events of Note  Hypokalemia in the setting of attempted diuresis   eICU Interventions  Plan; Potassium replaced   Intervention Category Intermediate Interventions: Electrolyte abnormality - evaluation and management  DETERDING,ELIZABETH 09/17/2013, 5:20 AM

## 2013-09-17 NOTE — Progress Notes (Signed)
eLink Physician-Brief Progress Note Patient Name: [name] DOB: May 11, 1932 MRN: [id number]  Date of Service  09/17/2013   HPI/Events of Note   Patient is actively dying.  Sherle Poe who is a friend of the family spoke with family and called eMD.  eICU Interventions  Spoke with daughter.  Decision was made to make patient a full DNR and comfort care.  Will change code status to DNR and begin comfort measures.      Sharnika Binney 09/17/2013, 4:55 PM

## 2013-09-17 NOTE — Progress Notes (Signed)
CSW received handoff that pt is from [facility]. CSW following case and will facilitate discharge back to [facility] when medically appropriate.    Ky Barban, MSW, [facility] Clinical Social Worker [phone]

## 2013-09-18 DIAGNOSIS — R0609 Other forms of dyspnea: Secondary | ICD-10-CM

## 2013-09-18 DIAGNOSIS — Z515 Encounter for palliative care: Secondary | ICD-10-CM

## 2013-09-18 DIAGNOSIS — R0989 Other specified symptoms and signs involving the circulatory and respiratory systems: Secondary | ICD-10-CM

## 2013-09-18 MED FILL — Dextrose Inj 50%: INTRAVENOUS | Qty: 50 | Status: AC

## 2013-09-19 NOTE — Consult Note (Signed)
I have reviewed and discussed the care of this patient in detail with the nurse practitioner including pertinent patient records, physical exam findings and data. I agree with details of this encounter.  

## 2013-09-20 LAB — CULTURE, BLOOD (ROUTINE X 2): Culture: NO GROWTH

## 2013-09-21 NOTE — Discharge Summary (Signed)
    Name: [name] MRN: [id number] DOB: 17-May-1932    ADMISSION DATE:  09/29/2013 CONSULTATION DATE:  09/16/13  REFERRING MD :  Debbe Odea, MD PRIMARY SERVICE: PCCM  CHIEF COMPLAINT:  Hypotension  BRIEF PATIENT DESCRIPTION:  80 years old female , NHR since 34,with PMH relevant for DM, CHF, PVD, obesity, stroke with right hemiparesis, dysphagia post PEG tube placement, severe malnutrition, anasarca, MRSA. Adm to [facility] 1[facility]13 for right upper lobe pneumonia, proteus mirabilis urinary tract infection, infected PEG tube site with MRSA, hypoglycemia. Course complicated by anasarca requiring diuresis, hypotension, diarrhea with lactulose (due to high ammonia)   1/18 Transferred from [facility] to [facility] under [place] for [place] and HCAP. In the stepdown with persistent hypotension in the setting of diuresis for her anasarca. PCCM consult called to assist with management.   SIGNIFICANT EVENTS / STUDIES:  1/18 tr from [facility]  1/18 levophed gtt   LINES / TUBES: - PICC line 1/13 >> - Foley catheter - PEG tube  CULTURES: - Blood culture 09/15/13 negative to date - Wound culture (PEG site): 1/13 >>MRSA -urine 1/13 Proteus >>  ANTIBIOTICS: - Cefepime - Vancomycin  COURSE :    NEUROLOGIC A:   1) Altered mental status likely secondary to acute illness     PULMONARY A: 1) HCAP P:     CARDIOVASCULAR A:  1) Hypotension likely secondary to intravascular volume depletion, anasarca, severe malnutrition and possibly sepsis. P:  -required pressors  RENAL A:   1) Normal creatinine, likely not a reliable marker of kidney function in the setting of severe malnutrition.  P:     GASTROINTESTINAL A:   1) Severe malnutrition 2) GERD 3) dysphagia s/p PEG P:   -  tube feedings   HEMATOLOGIC A:   1) Anemia likely secondary to chronic disease    INFECTIOUS A:   1) HCAP 2) MRSA wound infection P:     ENDOCRINE A:   1) DM 2) question of adrenal insufficiency P:    -stress dose steroids  Initially treated aggressively, then transitioned to  comfort Care after discussion with family   Cause of death - Septic shock, HCAP  Vs aspiration pneumonia Comorbidiites - CVA, dysphagia, DM-2    Ardie Mclennan V.  09/21/2013, 1:04 AM

## 2013-09-24 DIAGNOSIS — R0600 Dyspnea, unspecified: Secondary | ICD-10-CM

## 2013-09-30 NOTE — Progress Notes (Signed)
Chaplain was provided with consult to meet with family of pt on comfort care.  Chaplain spoke with goddaughter first.  Goddaughter relayed that family was "good" with their decision to let the pt "go".  It seems that the pt has been been ill for a little bit of time and the family wanted to provide the opportunity for the pt to live without artifical means.  After praying with the pt and goddaughter chaplain went out to speak to pt's daughter in waiting area.  Chaplain and daughter talked for some time, and she confirmed that family's decision to pursue comfort care.  Chaplain ended the visit shortly thereafter and the pt family seemed grateful.

## 2013-09-30 NOTE — Progress Notes (Signed)
Progress Note from the Palliative Medicine Team at [facility]:   patient is actively transitioning at EOL, unable to communicate or follow commands  -sat with daughter Jaquelyn Bitter, continued conversation regarding natural trajectory and expectations at EOL.   - She is able to express a sense of peace in regards to her mother's situation and understands that her time is limited and hopes for comfort.     Objective: Allergies  Allergen Reactions  . Macrobid [Nitrofurantoin] Other (See Comments)    Unknown reactions, allergy noted on MAR  . Penicillins Itching and Rash   Scheduled Meds: . albuterol  2.5 mg Nebulization Q6H  . sodium chloride  10-40 mL Intracatheter Q12H   Continuous Infusions: . sodium chloride 20 mL/hr at 09/17/13 0700  . morphine 22 mg/hr (09/27/2013 1322)   PRN Meds:.fentaNYL, morphine, sodium chloride  BP 52/27  Pulse 85  Temp(Src) 95.5 F (35.3 C) (Axillary)  Resp 6  Ht 5' 8.5" (1.74 m)  Wt 113.4 kg (250 lb)  BMI 37.46 kg/m2  SpO2 98%   PPS: 10 %  Pain Score:on morphine gtt   Intake/Output Summary (Last 24 hours) at 09/03/2013 1549 Last data filed at 09/24/2013 1322  Gross per 24 hour  Intake 439.82 ml  Output      0 ml  Net 439.82 ml       Physical Exam:  General: transitioning at EOL, prognosis is likely hrs HEENT:  Mm, no exudate Chest:   Scattered coarse BS, decreased in bases CVS: tachycardia rate 106 Abdomen: soft, decreased BS Ext: BLE +1 edema Neuro: unresponisve   Labs: CBC    Component Value Date/Time   WBC 22.6* 09/17/2013 0240   RBC 2.71* 09/17/2013 0240   HGB 8.3* 09/17/2013 0240   HCT 24.1* 09/17/2013 0240   PLT 132* 09/17/2013 0240   MCV 88.9 09/17/2013 0240   MCH 30.6 09/17/2013 0240   MCHC 34.4 09/17/2013 0240   RDW 19.0* 09/17/2013 0240   LYMPHSABS 5.5* 06-Oct-2013 1141   MONOABS 1.0 10/06/2013 1141   EOSABS 0.6 10-06-13 1141   BASOSABS 0.1 10/06/13 1141    BMET    Component Value Date/Time   NA 144  09/17/2013 0240   K 3.3* 09/17/2013 0240   CL 113* 09/17/2013 0240   CO2 21 09/17/2013 0240   GLUCOSE 189* 09/17/2013 0240   BUN 40* 09/17/2013 0240   CREATININE 0.63 09/17/2013 0240   CALCIUM 7.4* 09/17/2013 0240   GFRNONAA 83* 09/17/2013 0240   GFRAA >90 09/17/2013 0240    CMP     Component Value Date/Time   NA 144 09/17/2013 0240   K 3.3* 09/17/2013 0240   CL 113* 09/17/2013 0240   CO2 21 09/17/2013 0240   GLUCOSE 189* 09/17/2013 0240   BUN 40* 09/17/2013 0240   CREATININE 0.63 09/17/2013 0240   CALCIUM 7.4* 09/17/2013 0240   PROT 4.8* 09/17/2013 0240   ALBUMIN 0.6* 09/17/2013 0240   AST 31 09/17/2013 0240   ALT 18 09/17/2013 0240   ALKPHOS 175* 09/17/2013 0240   BILITOT 0.3 09/17/2013 0240   GFRNONAA 83* 09/17/2013 0240   GFRAA >90 09/17/2013 0240     Assessment and Plan:  1.  Comfort is main focus of care, prognosis is likely hrs.  Emotional support offered.  Questions and concerns addressed 2.  Pain/Dyspnea: Fentanyl 25-50 mcg every 2 hrs IV prn     Time In Time Out Total Time Spent with Patient Total Overall Time  1500 1525  25 min 25 min    Greater than 50%  of this time was spent counseling and coordinating care related to the above assessment and plan.  Wadie Lessen NP  Palliative Medicine Team Team Phone # [phone] Pager [phone]   1

## 2013-09-30 NOTE — Progress Notes (Signed)
Nutrition Brief Note  Chart reviewed. Pt now transitioning to comfort care. TFs have been discontinued.  Pt is actively dying.  No further nutrition interventions warranted at this time.  Please re-consult as needed.   Brynda Greathouse, MS RD LDN Clinical Inpatient Dietitian Pager: [phone] Weekend/After hours pager: [phone]

## 2013-09-30 NOTE — Progress Notes (Signed)
Name: [name] MRN: [id number] DOB: 1932-06-29    ADMISSION DATE:  09/21/2013 CONSULTATION DATE:  09/16/13  REFERRING MD :  Debbe Odea, MD PRIMARY SERVICE: PCCM  CHIEF COMPLAINT:  Hypotension  BRIEF PATIENT DESCRIPTION:  80 years old female , NHR since 3,with PMH relevant for DM, CHF, PVD, obesity, stroke with right hemiparesis, dysphagia post PEG tube placement, severe malnutrition, anasarca, MRSA. Adm to [facility] 1/13 for right upper lobe pneumonia, proteus mirabilis urinary tract infection, infected PEG tube site with MRSA, hypoglycemia. Course complicated by anasarca requiring diuresis, hypotension, diarrhea with lactulose (due to high ammonia)   1/18 Transferred from [facility] to [facility] under [place] for [place] and HCAP. In the stepdown with persistent hypotension in the setting of diuresis for her anasarca. PCCM consult called to assist with management.   SIGNIFICANT EVENTS / STUDIES:  1/18 tr from [facility]  1/18 levophed gtt   LINES / TUBES: - PICC line 1/13 >> - Foley catheter - PEG tube  CULTURES: - Blood culture 09/15/13 negative to date - Wound culture (PEG site): 1/13 >>MRSA -urine 1/13 Proteus >>  ANTIBIOTICS: - Cefepime - Vancomycin  HISTORY OF PRESENT ILLNESS:   80 years old female with PMH relevant for DM, CHF, PVD, obesity, stroke with right hemiparesis, dysphagia post PEG tube placement, severe malnutrition, anasarca, MRSA. Transferred from [facility] to [facility] under [facility] for [place] and HCAP. In the stepdown with persistent hypotension in the setting of diuresis for her anasarca. PCCM consult called to assist with management. At the time of my examination the patient is lethargic but arousable. Oriented in person and place. BP is 70/30, HR in the 90's, saturating 100% on 2 L [place].   SUBJECTIVE: afebrile Remains on levophed gtt Poor UO  VITAL SIGNS: Temp:  [95.5 F (35.3 C)-98.5 F (36.9 C)] 95.5 F (35.3 C) (01/20 0742) Pulse Rate:  [29-118]  85 (01/20 0742) Resp:  [7-38] 7 (01/20 0742) BP: (52-122)/(27-78) 52/27 mmHg (01/20 0742) SpO2:  [83 %-100 %] 98 % (01/20 0742) HEMODYNAMICS:   VENTILATOR SETTINGS:   INTAKE / OUTPUT: Intake/Output     01/19 0701 - 01/20 0700 01/20 0701 - 01/21 0700   I.V. (mL/kg) 749.4 (6.6)    NG/GT 400    IV Piggyback 200    Total Intake(mL/kg) 1349.4 (11.9)    Urine (mL/kg/hr) 65 (0)    Total Output 65     Net +1284.4          Stool Occurrence 2 x      PHYSICAL EXAMINATION: General: AA female, Ill appearance  Heart: Normal S1, S2. No murmurs, rubs, or gallops appreciated Lungs: scattered crackles bilateral fields, otherwise clear Abdomen: Abdomen soft, non-tender. PEG tube intact.  Musculoskeletal: diffuse anasarca Neuro: lethargic, follows no commands   LABS:  CBC  Recent Labs Lab 09/15/13 0627 09/16/13 0553 09/17/13 0240  WBC 19.7* 17.5* 22.6*  HGB 7.8* 7.4* 8.3*  HCT 22.2* 21.0* 24.1*  PLT 131* 107* 132*   BMET  Recent Labs Lab 09/15/13 0627 09/16/13 0553 09/17/13 0240  NA 146 142 144  K 3.7 3.4* 3.3*  CL 114* 111 113*  CO2 25 24 21   BUN 35* 40* 40*  CREATININE 0.65 0.60 0.63  GLUCOSE 152* 131* 189*   Electrolytes  Recent Labs Lab 09/15/13 0627 09/16/13 0553 09/17/13 0240  CALCIUM 7.4* 7.2* 7.4*   Sepsis Markers  Recent Labs Lab 09/17/13 0900  LATICACIDVEN 3.1*  PROCALCITON 0.51   Liver Enzymes  Recent Labs Lab 09/14/13 0445 09/17/13 0240  AST 17 31  ALT 13 18  ALKPHOS 193* 175*  BILITOT 0.2* 0.3  ALBUMIN 0.6* 0.6*   Glucose  Recent Labs Lab 09/16/13 1537 09/16/13 2002 09/17/13 0053 09/17/13 0524 09/17/13 0730 09/17/13 1253  GLUCAP 135* 118* 118* 201* 189* 211*       ASSESSMENT / PLAN: NEUROLOGIC A:   1) Altered mental status likely secondary to acute illness  1/19  palliative care consult, pt now DNR (no compressions, intubations, or aggressive measures), Comfort Care   PULMONARY A: 1) HCAP P:   -d/c  antibx  CARDIOVASCULAR A:  1) Hypotension likely secondary to intravascular volume depletion, anasarca, severe malnutrition and possibly sepsis. P:  -d/c all infusions  RENAL A:   1) Normal creatinine, likely not a reliable marker of kidney function in the setting of severe malnutrition.  P:   -d/c labs  GASTROINTESTINAL A:   1) Severe malnutrition 2) GERD 3) dysphagia s/p PEG P:   - d/c tube feedings - d/c Protonix  HEMATOLOGIC A:   1) Anemia likely secondary to chronic disease P:  - d/c labs  INFECTIOUS A:   1) HCAP 2) MRSA wound infection P:   -d/c antibx  ENDOCRINE A:   1) DM 2) question of adrenal insufficiency P:   -d/c labs -d/c steroids  Dispo: Full comfort Care, currently on morphine gtt, spoke with family at bedside  [facility], [place] during the described time interval was provided by me and/or other providers on the critical care team.  I have reviewed this patient's available data, including medical history, events of note, physical examination and test results as part of my evaluation  ALVA,RAKESH V.  2013/09/24, 8:46 AM

## 2013-09-30 NOTE — Progress Notes (Signed)
Patient expired at 17:14. Patients family at bedside. Dr Nelda Marseille in box notified. [place] donor called previously by [employer]. Patient pronounced by myself and Levin Erp. Funeral home notified.

## 2013-09-30 DEATH — deceased

## 2016-09-29 ENCOUNTER — Encounter: Payer: Self-pay | Admitting: Internal Medicine
